# Patient Record
Sex: Male | Born: 1935
Health system: Southern US, Community
[De-identification: ages and names within clinical notes are randomized; demographics above are authoritative.]

## PROBLEM LIST (undated history)

## (undated) DIAGNOSIS — N200 Calculus of kidney: Secondary | ICD-10-CM

## (undated) DIAGNOSIS — Z7902 Long term (current) use of antithrombotics/antiplatelets: Secondary | ICD-10-CM

## (undated) DIAGNOSIS — I2510 Atherosclerotic heart disease of native coronary artery without angina pectoris: Secondary | ICD-10-CM

## (undated) DIAGNOSIS — E785 Hyperlipidemia, unspecified: Secondary | ICD-10-CM

## (undated) DIAGNOSIS — R0609 Other forms of dyspnea: Secondary | ICD-10-CM

## (undated) DIAGNOSIS — R351 Nocturia: Secondary | ICD-10-CM

## (undated) DIAGNOSIS — K5909 Other constipation: Secondary | ICD-10-CM

## (undated) DIAGNOSIS — R300 Dysuria: Secondary | ICD-10-CM

## (undated) DIAGNOSIS — R32 Unspecified urinary incontinence: Secondary | ICD-10-CM

## (undated) DIAGNOSIS — N400 Enlarged prostate without lower urinary tract symptoms: Secondary | ICD-10-CM

## (undated) DIAGNOSIS — M1990 Unspecified osteoarthritis, unspecified site: Secondary | ICD-10-CM

## (undated) DIAGNOSIS — R7303 Prediabetes: Secondary | ICD-10-CM

## (undated) DIAGNOSIS — I739 Peripheral vascular disease, unspecified: Secondary | ICD-10-CM

## (undated) DIAGNOSIS — K8020 Calculus of gallbladder without cholecystitis without obstruction: Secondary | ICD-10-CM

## (undated) DIAGNOSIS — M65331 Trigger finger, right middle finger: Secondary | ICD-10-CM

## (undated) DIAGNOSIS — K219 Gastro-esophageal reflux disease without esophagitis: Secondary | ICD-10-CM

## (undated) DIAGNOSIS — I10 Essential (primary) hypertension: Secondary | ICD-10-CM

## (undated) DIAGNOSIS — I219 Acute myocardial infarction, unspecified: Secondary | ICD-10-CM

## (undated) DIAGNOSIS — I4510 Unspecified right bundle-branch block: Secondary | ICD-10-CM

## (undated) DIAGNOSIS — E7800 Pure hypercholesterolemia, unspecified: Secondary | ICD-10-CM

## (undated) DIAGNOSIS — R350 Frequency of micturition: Secondary | ICD-10-CM

## (undated) DIAGNOSIS — I700 Atherosclerosis of aorta: Secondary | ICD-10-CM

## (undated) DIAGNOSIS — R739 Hyperglycemia, unspecified: Secondary | ICD-10-CM

## (undated) HISTORY — DX: Essential (primary) hypertension: I10

## (undated) HISTORY — DX: Benign prostatic hyperplasia without lower urinary tract symptoms: N40.0

## (undated) HISTORY — DX: Nocturia: R35.1

## (undated) HISTORY — DX: Dysuria: R30.0

## (undated) HISTORY — DX: Unspecified urinary incontinence: R32

## (undated) HISTORY — DX: Frequency of micturition: R35.0

## (undated) HISTORY — PX: VASECTOMY: SHX75

## (undated) HISTORY — PX: CORONARY ARTERY BYPASS GRAFT: SHX141

## (undated) HISTORY — DX: Hyperlipidemia, unspecified: E78.5

---

## 1993-05-22 DIAGNOSIS — Z951 Presence of aortocoronary bypass graft: Secondary | ICD-10-CM

## 1993-05-22 DIAGNOSIS — I219 Acute myocardial infarction, unspecified: Secondary | ICD-10-CM

## 1993-05-22 HISTORY — DX: Acute myocardial infarction, unspecified: I21.9

## 1993-05-22 HISTORY — PX: CORONARY ARTERY BYPASS GRAFT: SHX141

## 1993-05-22 HISTORY — DX: Presence of aortocoronary bypass graft: Z95.1

## 2005-09-27 ENCOUNTER — Ambulatory Visit: Payer: Self-pay | Admitting: Internal Medicine

## 2007-04-17 ENCOUNTER — Ambulatory Visit: Payer: Self-pay | Admitting: Cardiology

## 2007-04-25 ENCOUNTER — Ambulatory Visit: Payer: Self-pay

## 2007-05-09 ENCOUNTER — Ambulatory Visit: Payer: Self-pay | Admitting: Cardiology

## 2010-10-04 NOTE — Assessment & Plan Note (Signed)
[facility]                            CARDIOLOGY OFFICE NOTE   NAME:Testa, RONNE STEFANSKI                           MRN:          [id number]  DATE:05/09/2007                            DOB:          02/09/1933    Mr. Stukey is a 77 year old gentleman who has a history of coronary  disease.  He underwent coronary artery bypassing graft in 1995.  When I  saw him on April 17, 2007, he was complaining of some dyspnea on  exertion.  We performed a Myoview on April 25, 2007.  His ejection  fraction was 61%.  There was a prior inferior scar but there was no  ischemia.  Since then he states his dyspnea has improved somewhat.  He  does have dyspnea with more extreme activities but not with routine  activities around the house.  There is no orthopnea, PND, pedal edema,  palpitations, presyncope, syncope or chest pain.   MEDICATIONS:  1. Atenolol 25 mg p.o. daily.  2. Folate 1 mg p.o. daily.  3. Aspirin 81 mg p.o. daily.  4. Caduet 5/40 daily.  5. Gemfibrozil 600 mg p.o. b.i.d.  6. Captopril 50 mg p.o. daily.  7. Avodart.   PHYSICAL EXAM:  Shows a blood pressure 130/62 and his pulse is 69.  He  weighs 189 pounds.  HEENT:  Normal.  NECK:  Supple.  CHEST:  Clear.  CARDIOVASCULAR EXAM:  Reveals a regular rate and rhythm.  ABDOMINAL EXAM:  No tenderness.  EXTREMITIES:  No edema.   DIAGNOSES:  1. Dyspnea on exertion - His recent Myoview showed no significant      ischemia and preserved left ventricular function.  Will therefore      continue with medical therapy.  He will continue on his aspirin,      statin, beta-blocker and ACE inhibitor.  2. Coronary artery disease - As per number 1.  3. Hyperlipidemia - He will have his most recent lipids and liver      forwarded to [place] for our records.  Our goal LDL should be less than      70, given his history of coronary disease.  4. History of benign prostatic hypertrophy.  5. Hypertension - His blood pressure is  adequately controlled on his      present medications.   He will see me back in 12 months.     Madolyn Frieze Jens Som, MD, [facility]  Electronically Signed    BSC/MedQ  DD: 05/09/2007  DT: 05/10/2007  Job #: [id number]   cc:   Kevan Ny, Dr.

## 2010-10-04 NOTE — Assessment & Plan Note (Signed)
[facility]                            CARDIOLOGY OFFICE NOTE   NAME:Harms, RIPLEY BOGOSIAN                           MRN:          [id number]  DATE:04/17/2007                            DOB:          18-Dec-1932    Mr. Lofton is a 77 year old gentleman whom I have not seen since October  2004. His cardiac history dates back to September of 1995. At that time,  he underwent coronary artery bypassing graft. He had a LIMA to the LAD,  saphenous vein graft to the diagonal, saphenous vein graft to the PDA  arising from the distal circumflex. His most recent nuclear study was  performed in October 2004. At that time, he had an ejection fraction of  61%. There was prior inferior scar versus soft-tissue attenuation, but  there was no ischemia. I have not seen him since that time. Note, the  patient saw Dr. Nedra Hai recently and was complaining of increased dyspnea on  exertion. We were asked to further evaluate. Note, there is a mild  language barrier making the history somewhat difficult. He does state  that he has noticed dyspnea on exertion over the past several weeks.  However, there is no orthopnea, PND, pedal edema, palpitations, pre-  syncope or syncope. He denies any chest pain.   CURRENT MEDICATIONS:  1. Atenolol 25 mg p.o. daily.  2. Folate 1 mg p.o. daily.  3. Aspirin 81 mg p.o. daily.  4. Caduet 5/40 daily.  5. Gemfibrozil 600 mg p.o. b.i.d.  6. Captopril 50 mg p.o. daily.  7. Avodart 0.5 mg p.o. daily.   ALLERGIES:  No known drug allergies.   SOCIAL HISTORY:  He does not smoke nor does he consume alcohol.   FAMILY HISTORY:  Negative for coronary artery disease.   PAST MEDICAL HISTORY:  1. Hypertension.  2. Hyperlipidemia.  3. No diabetes mellitus.  4. He does have a history of coronary disease.  5. He also apparently has a history of benign prostatic hypertrophy.   REVIEW OF SYSTEMS:  Denies any headaches, fevers, chills. There is no  productive cough  or hemoptysis. There is no dysphagia, odynophagia,  melena or hematochezia. There is no dysuria, hematuria. There is no rash  or seizure activity. There is no orthopnea, PND or pedal edema.  Remainder of review of systems are negative.   PHYSICAL EXAMINATION:  Shows a blood pressure of 145/67, pulse 70. He  weighs 186 pounds. He is well-developed, well-nourished in no acute  distress.  SKIN: Warm and dry.  He does not appear to be depressed.  There is no peripheral clubbing.  HEENT: Is normal with normal eyelids.  NECK: Supple, with a normal upstroke bilaterally. Cannot appreciate  bruits. There is no jugular venous distention and no thyromegaly is  noted.  CHEST: Clear to auscultation, normal expansion.  CARDIOVASCULAR: Reveals a regular rate and rhythm, normal S1, S2. There  are no murmurs, rubs or gallops noted.  ABDOMEN: Nontender. Positive bowel sounds. No hepatosplenomegaly. No  masses appreciated. There is no abdominal bruit.  He has 2+ femoral pulses bilaterally.  No bruits.  EXTREMITIES: Show no edema.  I can palpate no cords. He has 2+ posterior  tibial pulses bilaterally.  NEUROLOGIC: Examination is grossly intact.   Electrocardiogram shows a sinus rhythm at a rate of 73. There is an  incomplete right bundle branch block. There is anterior and inferior T-  wave inversion. He has had similar changes on previous  electrocardiograms.   DIAGNOSES:  1. Dyspnea on exertion. The patient is 13 years past coronary artery      bypass graft. We will plan to proceed with a stress Myoview. If      there are any abnormalities, then we will have a low threshold for      cardiac catheterization. We will continue with his aspirin and      statins.  2. Coronary artery disease. He will continue on his aspirin, statin,      beta-blocker and ACE inhibitor.  3. Hyperlipidemia. We will have his most recent lipids and liver      forwarded to [place] for our records with a goal LDL of less than  70.  4. Benign prostatic hypertrophy.   I will see him back in approximately two weeks to review the above  information.     Madolyn Frieze Jens Som, MD, [facility]  Electronically Signed    BSC/MedQ  DD: 04/17/2007  DT: 04/17/2007  Job #: [id number]   cc:   Kevan Ny, MD

## 2011-04-11 ENCOUNTER — Other Ambulatory Visit: Payer: Self-pay | Admitting: Family Medicine

## 2012-06-14 DIAGNOSIS — N3941 Urge incontinence: Secondary | ICD-10-CM | POA: Insufficient documentation

## 2012-06-14 DIAGNOSIS — N401 Enlarged prostate with lower urinary tract symptoms: Secondary | ICD-10-CM | POA: Insufficient documentation

## 2012-06-14 DIAGNOSIS — R351 Nocturia: Secondary | ICD-10-CM | POA: Insufficient documentation

## 2012-06-14 DIAGNOSIS — R350 Frequency of micturition: Secondary | ICD-10-CM | POA: Insufficient documentation

## 2013-02-17 DIAGNOSIS — R339 Retention of urine, unspecified: Secondary | ICD-10-CM | POA: Insufficient documentation

## 2014-02-25 DIAGNOSIS — N400 Enlarged prostate without lower urinary tract symptoms: Secondary | ICD-10-CM | POA: Insufficient documentation

## 2014-02-25 DIAGNOSIS — E785 Hyperlipidemia, unspecified: Secondary | ICD-10-CM | POA: Insufficient documentation

## 2014-02-25 DIAGNOSIS — R739 Hyperglycemia, unspecified: Secondary | ICD-10-CM | POA: Insufficient documentation

## 2014-02-25 DIAGNOSIS — I10 Essential (primary) hypertension: Secondary | ICD-10-CM | POA: Insufficient documentation

## 2014-02-25 DIAGNOSIS — I2510 Atherosclerotic heart disease of native coronary artery without angina pectoris: Secondary | ICD-10-CM | POA: Insufficient documentation

## 2014-04-06 DIAGNOSIS — Z1211 Encounter for screening for malignant neoplasm of colon: Secondary | ICD-10-CM | POA: Insufficient documentation

## 2014-11-26 ENCOUNTER — Encounter: Payer: Self-pay | Admitting: Urology

## 2014-11-26 ENCOUNTER — Ambulatory Visit (INDEPENDENT_AMBULATORY_CARE_PROVIDER_SITE_OTHER): Payer: Self-pay | Admitting: Urology

## 2014-11-26 VITALS — BP 147/70 | HR 78 | Ht 65.75 in | Wt 185.3 lb

## 2014-11-26 DIAGNOSIS — N138 Other obstructive and reflux uropathy: Secondary | ICD-10-CM

## 2014-11-26 DIAGNOSIS — R339 Retention of urine, unspecified: Secondary | ICD-10-CM

## 2014-11-26 DIAGNOSIS — R350 Frequency of micturition: Secondary | ICD-10-CM

## 2014-11-26 DIAGNOSIS — N401 Enlarged prostate with lower urinary tract symptoms: Secondary | ICD-10-CM

## 2014-11-26 LAB — URINALYSIS, COMPLETE
Bilirubin, UA: NEGATIVE
Glucose, UA: NEGATIVE
KETONES UA: NEGATIVE
LEUKOCYTES UA: NEGATIVE
NITRITE UA: NEGATIVE
Protein, UA: NEGATIVE
Specific Gravity, UA: 1.015 (ref 1.005–1.030)
UUROB: 0.2 mg/dL (ref 0.2–1.0)
pH, UA: 7.5 (ref 5.0–7.5)

## 2014-11-26 LAB — MICROSCOPIC EXAMINATION: Bacteria, UA: NONE SEEN

## 2014-11-26 LAB — BLADDER SCAN AMB NON-IMAGING

## 2014-11-26 MED ORDER — SOLIFENACIN SUCCINATE 5 MG PO TABS: 5.0000 mg | ORAL_TABLET | Freq: Every day | ORAL | Status: DC

## 2014-11-26 MED ORDER — FINASTERIDE 5 MG PO TABS: 5.0000 mg | ORAL_TABLET | Freq: Every day | ORAL | Status: DC

## 2014-11-26 NOTE — Progress Notes (Signed)
11/26/2014 10:57 AM   Timothy Bass [birth year] [id number]  Referring provider: No referring provider defined for this encounter.  Chief Complaint  Patient presents with  . Urinary Frequency    HPI: 81 yo M with BPH, LUTS who returns today for routine follow up.  Overall, he reports that his is doing fairly well and has few complaints today.  He is currently of finasteride and vesicare 5 mg daily which are helping his symptoms.   Patient stopped taking Flomax 0.4 mg a few months ago and has not seen much difference in his urinary symptoms.    He denies dysuria or UTIs.    Notcturia x1-2 but toher times more when he has soup a night.         IPSS      11/26/14 0900       International Prostate Symptom Score   How often have you had the sensation of not emptying your bladder? Less than 1 in 5     How often have you had to urinate less than every two hours? Less than half the time     How often have you found you stopped and started again several times when you urinated? Less than 1 in 5 times     How often have you found it difficult to postpone urination? Less than half the time     How often have you had a weak urinary stream? Less than 1 in 5 times     How often have you had to strain to start urination? Less than half the time     How many times did you typically get up at night to urinate? 3 Times     Total IPSS Score 12     Quality of Life due to urinary symptoms   If you were to spend the rest of your life with your urinary condition just the way it is now how would you feel about that? Mostly Satisfied         PMH: Past Medical History  Diagnosis Date  . Hyperlipemia   . BPH without obstruction/lower urinary tract symptoms   . Hypertension   . Dysuria   . Nocturia   . Urinary incontinence   . Urinary frequency     Surgical History: Past Surgical History  Procedure Laterality Date  . Vasectomy    . Coronary artery bypass graft      x2    Home  Medications:    Medication List       This list is accurate as of: 11/26/14 11:59 PM.  Always use your most recent med list.               aspirin 81 MG tablet  Take 81 mg by mouth daily.     atenolol 25 MG tablet  Commonly known as:  TENORMIN  Take 25 mg by mouth daily.     captopril 50 MG tablet  Commonly known as:  CAPOTEN  Take 50 mg by mouth daily.     finasteride 5 MG tablet  Commonly known as:  PROSCAR  Take 1 tablet (5 mg total) by mouth daily.     gemfibrozil 600 MG tablet  Commonly known as:  LOPID  Take 600 mg by mouth daily.     mometasone 0.1 % lotion  Commonly known as:  ELOCON  INSTILL 4 DROPS TO EACH EAR 1 TO 2 TIMES PER WEEK AS NEEDED FOR DRY SKIN AND ITCHING  omeprazole 40 MG capsule  Commonly known as:  PRILOSEC  TAKE 1 CAPSULE (40 MG TOTAL) BY MOUTH ONCE DAILY.     pravastatin 80 MG tablet  Commonly known as:  PRAVACHOL  Take 80 mg by mouth daily.     solifenacin 5 MG tablet  Commonly known as:  VESICARE  Take 1 tablet (5 mg total) by mouth daily.        Allergies:  Allergies  Allergen Reactions  . Aspirin     Pt.states aspirin gives pt. iigh blood pressure  . Peanuts [Peanut Oil]     Family History: Family History  Problem Relation Age of Onset  . Family history unknown: Yes    Social History:  reports that he quit smoking about 21 years ago. His smoking use included Cigarettes. He quit after 20 years of use. He does not have any smokeless tobacco history on file. He reports that he does not drink alcohol. His drug history is not on file.  ROS: UROLOGY Frequent Urination?: Yes Hard to postpone urination?: No Burning/pain with urination?: Yes Get up at night to urinate?: Yes Leakage of urine?: No Urine stream starts and stops?: No Trouble starting stream?: No Do you have to strain to urinate?: Yes Blood in urine?: No Urinary tract infection?: No Sexually transmitted disease?: No Injury to kidneys or bladder?:  No Painful intercourse?: No Weak stream?: No Erection problems?: No Penile pain?: No Gastrointestinal Nausea?: No Vomiting?: No Indigestion/heartburn?: No Diarrhea?: No Constipation?: No Constitutional Fever: No Night sweats?: No Weight loss?: No Fatigue?: No Skin Skin rash/lesions?: No Itching?: No Eyes Blurred vision?: No Double vision?: No Ears/Nose/Throat Sore throat?: No Sinus problems?: No Hematologic/Lymphatic Swollen glands?: No Easy bruising?: No Cardiovascular Leg swelling?: No Chest pain?: No Respiratory Cough?: No Shortness of breath?: No Endocrine Excessive thirst?: No Musculoskeletal Back pain?: Yes Joint pain?: No Neurological Headaches?: No Dizziness?: No Psychologic Depression?: No    ROS  Physical Exam: BP 147/70 mmHg  Pulse 78  Ht 5' 5.75" (1.67 m)  Wt 185 lb 4.8 oz (84.052 kg)  BMI 30.14 kg/m2  Constitutional:  Alert and oriented, No acute distress. HEENT: [place] AT, moist mucus membranes.  Trachea midline, no masses. Cardiovascular: No clubbing, cyanosis, or edema. Respiratory: Normal respiratory effort, no increased work of breathing. GI: Abdomen is soft, nontender, nondistended, no abdominal masses GU: No CVA tenderness.  Skin: No rashes, bruises or suspicious lesions. Neurologic: Grossly intact, no focal deficits, moving all 4 extremities. Psychiatric: Normal mood and affect.  Laboratory Data: N/a   Urinalysis Results for orders placed or performed in visit on 11/26/14  Microscopic Examination  Result Value Ref Range   WBC, UA 0-5 0 -  5 /hpf   RBC, UA 0-2 0 -  2 /hpf   Epithelial Cells (non renal) 0-10 0 - 10 /hpf   Bacteria, UA None seen None seen/Few  Urinalysis, Complete  Result Value Ref Range   Specific Gravity, UA 1.015 1.005 - 1.030   pH, UA 7.5 5.0 - 7.5   Color, UA Yellow Yellow   Appearance Ur Clear Clear   Leukocytes, UA Negative Negative   Protein, UA Negative Negative/Trace   Glucose, UA Negative  Negative   Ketones, UA Negative Negative   RBC, UA Trace (A) Negative   Bilirubin, UA Negative Negative   Urobilinogen, Ur 0.2 0.2 - 1.0 mg/dL   Nitrite, UA Negative Negative   Microscopic Examination See below:   BLADDER SCAN AMB NON-IMAGING  Result Value Ref Range  Scan Result 68ml     Assessment & Plan:  81 yo M with history of BPH, urinary frequency, and h/o incomplete bladder emptying.  Symptoms stable, doing well today.   No indication for further PCa screening given age.   1. Urinary frequency Continue Vesicare 5 mg - Urinalysis, Complete - BLADDER SCAN AMB NON-IMAGING  2. Benign prostatic hyperplasia with urinary obstruction Continue finasteride  3. Incomplete bladder emptying History of previously elevated PVRs.  PVR today acceptable on Vesicare 5mg     Return in about 1 year (around 11/26/2015) for IPSS, PVR.  Hollice Espy, MD  [facility] [address], [place], [place] [address] [phone]

## 2015-05-23 HISTORY — PX: OTHER SURGICAL HISTORY: SHX169

## 2015-05-27 ENCOUNTER — Ambulatory Visit: Payer: Medicare Other | Admitting: Anesthesiology

## 2015-05-27 ENCOUNTER — Encounter: Payer: Self-pay | Admitting: *Deleted

## 2015-05-27 ENCOUNTER — Ambulatory Visit
Admission: RE | Admit: 2015-05-27 | Discharge: 2015-05-27 | Disposition: A | Discharge status: Home / Self Care | Payer: Medicare Other | Source: Ambulatory Visit | Attending: Gastroenterology | Admitting: Gastroenterology

## 2015-05-27 ENCOUNTER — Encounter
Admission: RE | Disposition: A | Discharge status: Home / Self Care | Payer: Self-pay | Source: Ambulatory Visit | Attending: Gastroenterology

## 2015-05-27 DIAGNOSIS — R1013 Epigastric pain: Secondary | ICD-10-CM | POA: Insufficient documentation

## 2015-05-27 DIAGNOSIS — K210 Gastro-esophageal reflux disease with esophagitis: Secondary | ICD-10-CM | POA: Diagnosis not present

## 2015-05-27 DIAGNOSIS — R1310 Dysphagia, unspecified: Secondary | ICD-10-CM | POA: Insufficient documentation

## 2015-05-27 DIAGNOSIS — R350 Frequency of micturition: Secondary | ICD-10-CM | POA: Insufficient documentation

## 2015-05-27 DIAGNOSIS — R351 Nocturia: Secondary | ICD-10-CM | POA: Diagnosis not present

## 2015-05-27 DIAGNOSIS — N401 Enlarged prostate with lower urinary tract symptoms: Secondary | ICD-10-CM | POA: Diagnosis not present

## 2015-05-27 DIAGNOSIS — R739 Hyperglycemia, unspecified: Secondary | ICD-10-CM | POA: Insufficient documentation

## 2015-05-27 DIAGNOSIS — Z7982 Long term (current) use of aspirin: Secondary | ICD-10-CM | POA: Insufficient documentation

## 2015-05-27 DIAGNOSIS — Z79899 Other long term (current) drug therapy: Secondary | ICD-10-CM | POA: Insufficient documentation

## 2015-05-27 DIAGNOSIS — I2510 Atherosclerotic heart disease of native coronary artery without angina pectoris: Secondary | ICD-10-CM | POA: Insufficient documentation

## 2015-05-27 DIAGNOSIS — I10 Essential (primary) hypertension: Secondary | ICD-10-CM | POA: Insufficient documentation

## 2015-05-27 DIAGNOSIS — R338 Other retention of urine: Secondary | ICD-10-CM | POA: Diagnosis not present

## 2015-05-27 DIAGNOSIS — E785 Hyperlipidemia, unspecified: Secondary | ICD-10-CM | POA: Insufficient documentation

## 2015-05-27 DIAGNOSIS — K219 Gastro-esophageal reflux disease without esophagitis: Secondary | ICD-10-CM | POA: Diagnosis present

## 2015-05-27 HISTORY — PX: ESOPHAGOGASTRODUODENOSCOPY (EGD) WITH PROPOFOL: SHX5813

## 2015-05-27 HISTORY — DX: Hyperglycemia, unspecified: R73.9

## 2015-05-27 HISTORY — DX: Atherosclerotic heart disease of native coronary artery without angina pectoris: I25.10

## 2015-05-27 HISTORY — DX: Benign prostatic hyperplasia without lower urinary tract symptoms: N40.0

## 2015-05-27 SURGERY — ESOPHAGOGASTRODUODENOSCOPY (EGD) WITH PROPOFOL
Anesthesia: General

## 2015-05-27 MED ORDER — MIDAZOLAM HCL 2 MG/2ML IJ SOLN
INTRAMUSCULAR | Status: DC | PRN
Start: 2015-05-27 — End: 2015-05-27
  Administered 2015-05-27: 1 mg via INTRAVENOUS

## 2015-05-27 MED ORDER — FENTANYL CITRATE (PF) 100 MCG/2ML IJ SOLN
INTRAMUSCULAR | Status: DC | PRN
  Administered 2015-05-27: 50 ug via INTRAVENOUS

## 2015-05-27 MED ORDER — SODIUM CHLORIDE 0.9 % IV SOLN: INTRAVENOUS | Status: DC

## 2015-05-27 MED ORDER — PROPOFOL 10 MG/ML IV BOLUS
INTRAVENOUS | Status: DC | PRN
  Administered 2015-05-27: 40 mg via INTRAVENOUS

## 2015-05-27 MED ORDER — ATENOLOL 25 MG PO TABS
25.0000 mg | ORAL_TABLET | Freq: Once | ORAL | Status: AC
  Administered 2015-05-27: 25 mg via ORAL

## 2015-05-27 MED ORDER — SODIUM CHLORIDE 0.9 % IV SOLN
INTRAVENOUS | Status: DC
  Administered 2015-05-27 (×2): via INTRAVENOUS

## 2015-05-27 MED ORDER — PROPOFOL 500 MG/50ML IV EMUL
INTRAVENOUS | Status: DC | PRN
  Administered 2015-05-27: 40 ug/kg/min via INTRAVENOUS

## 2015-05-27 NOTE — Anesthesia Postprocedure Evaluation (Signed)
Anesthesia Post Note  Patient: Timothy Bass  Procedure(s) Performed: Procedure(s) (LRB): ESOPHAGOGASTRODUODENOSCOPY (EGD) WITH PROPOFOL (N/A)  Patient location during evaluation: Endoscopy Anesthesia Type: General Level of consciousness: awake and alert Pain management: pain level controlled Vital Signs Assessment: post-procedure vital signs reviewed and stable Respiratory status: spontaneous breathing, nonlabored ventilation, respiratory function stable and patient connected to nasal cannula oxygen Cardiovascular status: blood pressure returned to baseline and stable Postop Assessment: no signs of nausea or vomiting Anesthetic complications: no    Last Vitals:  Filed Vitals:   05/27/15 1030 05/27/15 1040  BP: 134/66 147/75  Pulse:    Temp:    Resp:      Last Pain:  Filed Vitals:   05/27/15 1059  PainSc: 0-No pain                 Martha Clan

## 2015-05-27 NOTE — Op Note (Signed)
[facility] Patient Name: Timothy Bass Procedure Date: 05/27/2015 9:49 AM MRN: [id number] Account #: [id number] Date of Birth: 10/14/1932 Admit Type: Outpatient Age: 82 Room: [facility] ENDO ROOM 4 Gender: Male Note Status: Finalized Procedure:         Upper GI endoscopy Indications:       Epigastric abdominal pain, Dysphagia, Suspected esophageal                     reflux Providers:         Lupita Dawn. Candace Cruise, MD Referring MD:      Youlanda Roys. Lovie Macadamia, MD (Referring MD) Medicines:         Monitored Anesthesia Care Complications:     No immediate complications. Procedure:         Pre-Anesthesia Assessment:                    - Prior to the procedure, a History and Physical was                     performed, and patient medications, allergies and                     sensitivities were reviewed. The patient's tolerance of                     previous anesthesia was reviewed.                    - The risks and benefits of the procedure and the sedation                     options and risks were discussed with the patient. All                     questions were answered and informed consent was obtained.                    - After reviewing the risks and benefits, the patient was                     deemed in satisfactory condition to undergo the procedure.                    After obtaining informed consent, the endoscope was passed                     under direct vision. Throughout the procedure, the                     patient's blood pressure, pulse, and oxygen saturations                     were monitored continuously. The Endoscope was introduced                     through the mouth, and advanced to the second part of                     duodenum. The upper GI endoscopy was accomplished without                     difficulty. The patient tolerated the procedure well. Findings:      LA Grade A (one or more mucosal breaks less than  5 mm, not extending        between tops of 2 mucosal folds) esophagitis was found at the       gastroesophageal junction. Biopsies were taken with a cold forceps for       histology. The scope was withdrawn. Dilation was performed with a       Maloney dilator with mild resistance at 39 Fr.      The exam was otherwise without abnormality.      The entire examined stomach was normal.      Localized mildly erythematous mucosa was found in the duodenal bulb.       Biopsies were taken with a cold forceps for histology.      The exam was otherwise without abnormality. Impression:        - LA Grade A reflux esophagitis. Biopsied. Dilated.                    - The examination was otherwise normal.                    - Normal stomach.                    - Erythematous duodenopathy. Biopsied.                    - The examination was otherwise normal. Recommendation:    - Discharge patient to home.                    - Observe patient's clinical course.                    - Await pathology results.                    - Continue present medications.                    - The findings and recommendations were discussed with the                     patient. Procedure Code(s): --- Professional ---                    [phone], Esophagogastroduodenoscopy, flexible, transoral;                     with biopsy, single or multiple                    43450, Dilation of esophagus, by unguided sound or bougie,                     single or multiple passes Diagnosis Code(s): --- Professional ---                    K21.0, Gastro-esophageal reflux disease with esophagitis                    K31.89, Other diseases of stomach and duodenum                    R13.10, Dysphagia, unspecified                    R10.13, Epigastric pain CPT copyright 2014 American Medical Association. All rights reserved. The codes documented in this report are preliminary and upon coder review may  be revised  to meet current compliance requirements. Hulen Luster,  MD 05/27/2015 10:05:42 AM This report has been signed electronically. Number of Addenda: 0 Note Initiated On: 05/27/2015 9:49 AM      [facility]

## 2015-05-27 NOTE — Transfer of Care (Signed)
Immediate Anesthesia Transfer of Care Note  Patient: Timothy Bass  Procedure(s) Performed: Procedure(s): ESOPHAGOGASTRODUODENOSCOPY (EGD) WITH PROPOFOL (N/A)  Patient Location: PACU  Anesthesia Type:General  Level of Consciousness: awake, alert  and oriented  Airway & Oxygen Therapy: Patient Spontanous Breathing and Patient connected to nasal cannula oxygen  Post-op Assessment: Report given to RN and Post -op Vital signs reviewed and stable  Post vital signs: Reviewed and stable  Last Vitals:  Filed Vitals:   05/27/15 0839  BP: 166/79  Pulse: 75  Temp: 36.5 C  Resp: 16    Complications: No apparent anesthesia complications

## 2015-05-27 NOTE — Anesthesia Preprocedure Evaluation (Signed)
Anesthesia Evaluation  Patient identified by MRN, date of birth, ID band Patient awake    Reviewed: Allergy & Precautions, H&P , NPO status , Patient's Chart, lab work & pertinent test results, reviewed documented beta blocker date and time   History of Anesthesia Complications Negative for: history of anesthetic complications  Airway Mallampati: II  TM Distance: >3 FB Neck ROM: full    Dental no notable dental hx. (+) Edentulous Upper, Edentulous Lower   Pulmonary neg shortness of breath, neg sleep apnea, neg COPD, neg recent URI, former smoker,    Pulmonary exam normal breath sounds clear to auscultation       Cardiovascular Exercise Tolerance: Good hypertension, (-) angina+ CAD, + Past MI and + CABG (in 1994)  (-) Cardiac Stents negative cardio ROS Normal cardiovascular exam(-) dysrhythmias (-) Valvular Problems/Murmurs Rhythm:regular Rate:Normal     Neuro/Psych negative neurological ROS  negative psych ROS   GI/Hepatic Neg liver ROS, GERD  Medicated and Controlled,  Endo/Other  negative endocrine ROS  Renal/GU negative Renal ROS  negative genitourinary   Musculoskeletal   Abdominal   Peds  Hematology negative hematology ROS (+)   Anesthesia Other Findings Past Medical History:   Hyperlipemia                                                 BPH without obstruction/lower urinary tract sy*              Hypertension                                                 Dysuria                                                      Nocturia                                                     Urinary incontinence                                         Urinary frequency                                            Coronary artery disease                                      Hyperglycemia  Enlarged prostate                                            Prostatic hypertrophy                                         Reproductive/Obstetrics negative OB ROS                             Anesthesia Physical Anesthesia Plan  ASA: III  Anesthesia Plan: General   Post-op Pain Management:    Induction:   Airway Management Planned:   Additional Equipment:   Intra-op Plan:   Post-operative Plan:   Informed Consent: I have reviewed the patients History and Physical, chart, labs and discussed the procedure including the risks, benefits and alternatives for the proposed anesthesia with the patient or authorized representative who has indicated his/her understanding and acceptance.   Dental Advisory Given  Plan Discussed with: Anesthesiologist, CRNA and Surgeon  Anesthesia Plan Comments:         Anesthesia Quick Evaluation

## 2015-05-27 NOTE — Anesthesia Procedure Notes (Signed)
Date/Time: 05/27/2015 9:44 AM Performed by: Nelda Marseille Pre-anesthesia Checklist: Patient identified, Emergency Drugs available, Suction available, Patient being monitored and Timeout performed Oxygen Delivery Method: Nasal cannula

## 2015-05-27 NOTE — H&P (Signed)
  Date of Initial H&P:05/05/2015  History reviewed, patient examined, no change in status, stable for surgery.

## 2015-05-31 LAB — SURGICAL PATHOLOGY

## 2015-06-17 ENCOUNTER — Telehealth: Payer: Self-pay

## 2015-06-17 NOTE — Telephone Encounter (Signed)
Pt pharmacy sent a refill request for tamsulosin. I did not see in your dictation where he was taking medication and in d/c'd meds I see where it was taken out. Please advise.

## 2015-06-17 NOTE — Telephone Encounter (Signed)
Does not need.  He was not taking and saw no difference in voiding symptoms.    Hollice Espy, MD

## 2015-07-20 ENCOUNTER — Encounter: Payer: Self-pay | Admitting: Obstetrics and Gynecology

## 2015-07-20 ENCOUNTER — Ambulatory Visit (INDEPENDENT_AMBULATORY_CARE_PROVIDER_SITE_OTHER): Payer: Medicare Other | Admitting: Obstetrics and Gynecology

## 2015-07-20 VITALS — BP 136/70 | HR 73 | Resp 16 | Ht 65.75 in | Wt 184.8 lb

## 2015-07-20 DIAGNOSIS — N400 Enlarged prostate without lower urinary tract symptoms: Secondary | ICD-10-CM

## 2015-07-20 DIAGNOSIS — R350 Frequency of micturition: Secondary | ICD-10-CM | POA: Diagnosis not present

## 2015-07-20 LAB — BLADDER SCAN AMB NON-IMAGING

## 2015-07-20 MED ORDER — TAMSULOSIN HCL 0.4 MG PO CAPS: 0.4000 mg | ORAL_CAPSULE | Freq: Every day | ORAL | Status: DC

## 2015-07-20 NOTE — Progress Notes (Signed)
10:49 AM   Timothy Bass 1932/06/19 [id number]  Referring provider: Juluis Pitch, MD [address] [place], [place] [address]  Chief Complaint  Patient presents with  . Urinary Frequency    HPI: 82 yo M with BPH, LUTS who returns today for routine follow up.  Overall, he reports that his is doing fairly well and has few complaints today.  He is currently of finasteride and vesicare 5 mg daily which are helping his symptoms.   Patient stopped taking Flomax 0.4 mg a few months ago and has not seen much difference in his urinary symptoms.    He denies dysuria or UTIs.    Nocturia x1-2 but toher times more when he has soup a night.    Interval History 07/20/15  Patient requesting refill for Flomax.  Feels his symptoms improved while taking medication.   PMH: Past Medical History  Diagnosis Date  . Hyperlipemia   . BPH without obstruction/lower urinary tract symptoms   . Hypertension   . Dysuria   . Nocturia   . Urinary incontinence   . Urinary frequency   . Coronary artery disease   . Hyperglycemia   . Enlarged prostate   . Prostatic hypertrophy     Surgical History: Past Surgical History  Procedure Laterality Date  . Vasectomy    . Coronary artery bypass graft      x2  . Esophagogastroduodenoscopy (egd) with propofol N/A 05/27/2015    Procedure: ESOPHAGOGASTRODUODENOSCOPY (EGD) WITH PROPOFOL;  Surgeon: Hulen Luster, MD;  Location: [facility] ENDOSCOPY;  Service: Gastroenterology;  Laterality: N/A;    Home Medications:    Medication List       This list is accurate as of: 07/20/15 10:49 AM.  Always use your most recent med list.               amLODipine-benazepril 5-10 MG capsule  Commonly known as:  LOTREL     aspirin 81 MG tablet  Take 81 mg by mouth daily.     atenolol 25 MG tablet  Commonly known as:  TENORMIN  Take 25 mg by mouth daily.     bismuth subsalicylate 99991111 MG chewable tablet  Commonly known as:  PEPTO BISMOL  Chew 524 mg by mouth as needed.       captopril 50 MG tablet  Commonly known as:  CAPOTEN  Take 50 mg by mouth daily.     Cholecalciferol 1000 units Tbdp  Take 1,000 Units by mouth daily.     finasteride 5 MG tablet  Commonly known as:  PROSCAR  Take 1 tablet (5 mg total) by mouth daily.     gemfibrozil 600 MG tablet  Commonly known as:  LOPID  Take 600 mg by mouth daily.     glucosamine-chondroitin 500-400 MG tablet  Take 1 tablet by mouth 3 (three) times daily.     mometasone 0.1 % lotion  Commonly known as:  ELOCON  INSTILL 4 DROPS TO EACH EAR 1 TO 2 TIMES PER WEEK AS NEEDED FOR DRY SKIN AND ITCHING     MULTI-VITAMINS Tabs  Take by mouth.     omega-3 acid ethyl esters 1 g capsule  Commonly known as:  LOVAZA  Take by mouth 2 (two) times daily.     omeprazole 40 MG capsule  Commonly known as:  PRILOSEC  TAKE 1 CAPSULE (40 MG TOTAL) BY MOUTH ONCE DAILY.     pantoprazole 40 MG tablet  Commonly known as:  PROTONIX  Take 40 mg by mouth daily.     pravastatin 80 MG tablet  Commonly known as:  PRAVACHOL  Take 80 mg by mouth daily.     solifenacin 5 MG tablet  Commonly known as:  VESICARE  Take 1 tablet (5 mg total) by mouth daily.     tamsulosin 0.4 MG Caps capsule  Commonly known as:  FLOMAX  Take 0.4 mg by mouth.        Allergies:  Allergies  Allergen Reactions  . Aspirin     Pt.states aspirin gives pt. iigh blood pressure  . Peanuts [Peanut Oil]     Family History: Family History  Problem Relation Age of Onset  . Family history unknown: Yes    Social History:  reports that he quit smoking about 22 years ago. His smoking use included Cigarettes. He quit after 20 years of use. He does not have any smokeless tobacco history on file. He reports that he does not drink alcohol. His drug history is not on file.  ROS: UROLOGY Frequent Urination?: No Hard to postpone urination?: No Burning/pain with urination?: No Get up at night to urinate?: Yes Leakage of urine?: Yes Urine stream  starts and stops?: No Trouble starting stream?: Yes Do you have to strain to urinate?: No Blood in urine?: No Urinary tract infection?: No Sexually transmitted disease?: No Injury to kidneys or bladder?: No Painful intercourse?: No Weak stream?: No Erection problems?: No Penile pain?: No Gastrointestinal Nausea?: No Vomiting?: No Indigestion/heartburn?: No Diarrhea?: No Constipation?: No Constitutional Fever: No Night sweats?: No Weight loss?: No Fatigue?: No Skin Skin rash/lesions?: No Itching?: No Eyes Blurred vision?: No Double vision?: No Ears/Nose/Throat Sore throat?: No Sinus problems?: No Hematologic/Lymphatic Swollen glands?: No Easy bruising?: No Cardiovascular Leg swelling?: No Chest pain?: No Respiratory Cough?: No Shortness of breath?: Yes Endocrine Excessive thirst?: No Musculoskeletal Back pain?: Yes Joint pain?: No Neurological Headaches?: No Dizziness?: No Psychologic Depression?: No Anxiety?: No    ROS  Physical Exam: BP 136/70 mmHg  Pulse 73  Resp 16  Ht 5' 5.75" (1.67 m)  Wt 184 lb 12.8 oz (83.825 kg)  BMI 30.06 kg/m2  Constitutional:  Alert and oriented, No acute distress. HEENT: [place] AT, moist mucus membranes.  Trachea midline, no masses. Cardiovascular: No clubbing, cyanosis, or edema. Respiratory: Normal respiratory effort, no increased work of breathing. GI: Abdomen is soft, nontender, nondistended, no abdominal masses GU: No CVA tenderness.  Skin: No rashes, bruises or suspicious lesions. Neurologic: Grossly intact, no focal deficits, moving all 4 extremities. Psychiatric: Normal mood and affect.  Laboratory Data: N/a   Urinalysis Results for orders placed or performed in visit on 07/20/15  BLADDER SCAN AMB NON-IMAGING  Result Value Ref Range   Scan Result 169 mL     Assessment & Plan:  82 yo M with history of BPH, urinary frequency, and h/o incomplete bladder emptying.  Symptoms stable, doing well today.   No  indication for further PCa screening given age.   1. Urinary frequency Continue Vesicare 5 mg - Urinalysis, Complete - BLADDER SCAN AMB NON-IMAGING  2. Benign prostatic hyperplasia with urinary obstruction- Refilled Flomax. Continue finasteride  3. Incomplete bladder emptying History of previously elevated PVRs.  PVR today acceptable on Vesicare 5mg     No Follow-up on file.  Herbert Moors, [facility] [address] [place], [place] [address] [phone]

## 2015-11-26 ENCOUNTER — Ambulatory Visit: Payer: Self-pay | Admitting: Urology

## 2015-11-30 ENCOUNTER — Ambulatory Visit: Payer: Medicare Other | Admitting: Urology

## 2015-12-13 ENCOUNTER — Other Ambulatory Visit: Payer: Self-pay | Admitting: Urology

## 2015-12-28 ENCOUNTER — Other Ambulatory Visit: Payer: Self-pay | Admitting: Urology

## 2015-12-28 DIAGNOSIS — N400 Enlarged prostate without lower urinary tract symptoms: Secondary | ICD-10-CM

## 2015-12-30 DIAGNOSIS — I200 Unstable angina: Secondary | ICD-10-CM

## 2016-01-05 ENCOUNTER — Encounter: Payer: Self-pay | Admitting: *Deleted

## 2016-01-05 ENCOUNTER — Encounter
Admission: RE | Disposition: A | Discharge status: Home / Self Care | Payer: Self-pay | Source: Ambulatory Visit | Attending: Cardiology

## 2016-01-05 ENCOUNTER — Ambulatory Visit
Admission: RE | Admit: 2016-01-05 | Discharge: 2016-01-06 | Disposition: A | Discharge status: Home / Self Care | Payer: Medicare Other | Source: Ambulatory Visit | Attending: Cardiology | Admitting: Cardiology

## 2016-01-05 DIAGNOSIS — E7801 Familial hypercholesterolemia: Secondary | ICD-10-CM | POA: Insufficient documentation

## 2016-01-05 DIAGNOSIS — X58XXXA Exposure to other specified factors, initial encounter: Secondary | ICD-10-CM | POA: Insufficient documentation

## 2016-01-05 DIAGNOSIS — I10 Essential (primary) hypertension: Secondary | ICD-10-CM | POA: Diagnosis not present

## 2016-01-05 DIAGNOSIS — K210 Gastro-esophageal reflux disease with esophagitis: Secondary | ICD-10-CM | POA: Insufficient documentation

## 2016-01-05 DIAGNOSIS — Z79899 Other long term (current) drug therapy: Secondary | ICD-10-CM | POA: Diagnosis not present

## 2016-01-05 DIAGNOSIS — Z87891 Personal history of nicotine dependence: Secondary | ICD-10-CM | POA: Insufficient documentation

## 2016-01-05 DIAGNOSIS — Z9852 Vasectomy status: Secondary | ICD-10-CM | POA: Diagnosis not present

## 2016-01-05 DIAGNOSIS — T82858A Stenosis of vascular prosthetic devices, implants and grafts, initial encounter: Secondary | ICD-10-CM | POA: Diagnosis not present

## 2016-01-05 DIAGNOSIS — Z91010 Allergy to peanuts: Secondary | ICD-10-CM | POA: Diagnosis not present

## 2016-01-05 DIAGNOSIS — N400 Enlarged prostate without lower urinary tract symptoms: Secondary | ICD-10-CM | POA: Insufficient documentation

## 2016-01-05 DIAGNOSIS — Z7982 Long term (current) use of aspirin: Secondary | ICD-10-CM | POA: Insufficient documentation

## 2016-01-05 DIAGNOSIS — R079 Chest pain, unspecified: Secondary | ICD-10-CM | POA: Diagnosis present

## 2016-01-05 DIAGNOSIS — I25709 Atherosclerosis of coronary artery bypass graft(s), unspecified, with unspecified angina pectoris: Secondary | ICD-10-CM | POA: Diagnosis not present

## 2016-01-05 DIAGNOSIS — I2510 Atherosclerotic heart disease of native coronary artery without angina pectoris: Secondary | ICD-10-CM | POA: Diagnosis present

## 2016-01-05 DIAGNOSIS — I200 Unstable angina: Secondary | ICD-10-CM

## 2016-01-05 HISTORY — DX: Gastro-esophageal reflux disease without esophagitis: K21.9

## 2016-01-05 HISTORY — DX: Pure hypercholesterolemia, unspecified: E78.00

## 2016-01-05 HISTORY — PX: CARDIAC CATHETERIZATION: SHX172

## 2016-01-05 SURGERY — LEFT HEART CATH AND CORS/GRAFTS ANGIOGRAPHY
Anesthesia: Moderate Sedation

## 2016-01-05 MED ORDER — NITROGLYCERIN 1 MG/10 ML FOR IR/CATH LAB
INTRA_ARTERIAL | Status: DC | PRN
  Administered 2016-01-05: 300 ug via INTRACORONARY

## 2016-01-05 MED ORDER — SODIUM CHLORIDE 0.9 % IV SOLN: INTRAVENOUS | Status: DC | PRN

## 2016-01-05 MED ORDER — ONDANSETRON HCL 4 MG/2ML IJ SOLN: 4.0000 mg | Freq: Four times a day (QID) | INTRAMUSCULAR | Status: DC | PRN

## 2016-01-05 MED ORDER — IOPAMIDOL (ISOVUE-300) INJECTION 61%
INTRAVENOUS | Status: DC | PRN
  Administered 2016-01-05: 255 mL via INTRA_ARTERIAL

## 2016-01-05 MED ORDER — ACETAMINOPHEN 325 MG PO TABS: 650.0000 mg | ORAL_TABLET | ORAL | Status: DC | PRN

## 2016-01-05 MED ORDER — SODIUM CHLORIDE 0.9% FLUSH
3.0000 mL | Freq: Two times a day (BID) | INTRAVENOUS | Status: DC
  Administered 2016-01-05 (×2): 3 mL via INTRAVENOUS

## 2016-01-05 MED ORDER — SODIUM CHLORIDE 0.9 % WEIGHT BASED INFUSION: 3.0000 mL/kg/h | INTRAVENOUS | Status: DC

## 2016-01-05 MED ORDER — NITROGLYCERIN 5 MG/ML IV SOLN
INTRAVENOUS | Status: AC
  Filled 2016-01-05: qty 10

## 2016-01-05 MED ORDER — FENTANYL CITRATE (PF) 100 MCG/2ML IJ SOLN
INTRAMUSCULAR | Status: AC
  Filled 2016-01-05: qty 2

## 2016-01-05 MED ORDER — SODIUM CHLORIDE 0.9 % IV SOLN: 250.0000 mL | INTRAVENOUS | Status: DC | PRN

## 2016-01-05 MED ORDER — AMLODIPINE BESYLATE 5 MG PO TABS
5.0000 mg | ORAL_TABLET | Freq: Every day | ORAL | Status: DC
Start: 2016-01-05 — End: 2016-01-06
  Administered 2016-01-05 – 2016-01-06 (×2): 5 mg via ORAL
  Filled 2016-01-05 (×2): qty 1

## 2016-01-05 MED ORDER — SODIUM CHLORIDE 0.9 % WEIGHT BASED INFUSION: 1.0000 mL/kg/h | INTRAVENOUS | Status: DC

## 2016-01-05 MED ORDER — BIVALIRUDIN 250 MG IV SOLR
INTRAVENOUS | Status: DC | PRN
  Administered 2016-01-05: 1.75 mg/kg/h via INTRAVENOUS

## 2016-01-05 MED ORDER — ASPIRIN EC 325 MG PO TBEC
325.0000 mg | DELAYED_RELEASE_TABLET | Freq: Every day | ORAL | Status: DC
  Administered 2016-01-06: 325 mg via ORAL
  Filled 2016-01-05 (×2): qty 1

## 2016-01-05 MED ORDER — BIVALIRUDIN 250 MG IV SOLR
INTRAVENOUS | Status: AC
  Filled 2016-01-05: qty 250

## 2016-01-05 MED ORDER — BENAZEPRIL HCL 10 MG PO TABS
10.0000 mg | ORAL_TABLET | Freq: Every day | ORAL | Status: DC
  Administered 2016-01-05 – 2016-01-06 (×2): 10 mg via ORAL
  Filled 2016-01-05 (×2): qty 1

## 2016-01-05 MED ORDER — CLOPIDOGREL BISULFATE 75 MG PO TABS
75.0000 mg | ORAL_TABLET | Freq: Every day | ORAL | Status: DC
  Administered 2016-01-06: 75 mg via ORAL
  Filled 2016-01-05: qty 1

## 2016-01-05 MED ORDER — ASPIRIN 81 MG PO CHEW: 81.0000 mg | CHEWABLE_TABLET | ORAL | Status: DC

## 2016-01-05 MED ORDER — TAMSULOSIN HCL 0.4 MG PO CAPS
0.4000 mg | ORAL_CAPSULE | Freq: Every day | ORAL | Status: DC
  Administered 2016-01-05 – 2016-01-06 (×2): 0.4 mg via ORAL
  Filled 2016-01-05 (×2): qty 1

## 2016-01-05 MED ORDER — SODIUM CHLORIDE 0.9% FLUSH: 3.0000 mL | INTRAVENOUS | Status: DC | PRN

## 2016-01-05 MED ORDER — IOPAMIDOL (ISOVUE-300) INJECTION 61%
INTRAVENOUS | Status: DC | PRN
  Administered 2016-01-05: 210 mL via INTRA_ARTERIAL

## 2016-01-05 MED ORDER — TERAZOSIN HCL 2 MG PO CAPS
2.0000 mg | ORAL_CAPSULE | Freq: Every day | ORAL | Status: DC
  Administered 2016-01-05: 2 mg via ORAL
  Filled 2016-01-05: qty 1

## 2016-01-05 MED ORDER — ASPIRIN 81 MG PO CHEW
CHEWABLE_TABLET | ORAL | Status: AC
  Filled 2016-01-05: qty 4

## 2016-01-05 MED ORDER — CLOPIDOGREL BISULFATE 75 MG PO TABS
ORAL_TABLET | ORAL | Status: DC | PRN
  Administered 2016-01-05: 600 mg via ORAL

## 2016-01-05 MED ORDER — SODIUM CHLORIDE 0.9% FLUSH: 3.0000 mL | Freq: Two times a day (BID) | INTRAVENOUS | Status: DC

## 2016-01-05 MED ORDER — ATENOLOL 25 MG PO TABS
25.0000 mg | ORAL_TABLET | Freq: Every day | ORAL | Status: DC
  Administered 2016-01-05 – 2016-01-06 (×2): 25 mg via ORAL
  Filled 2016-01-05 (×2): qty 1

## 2016-01-05 MED ORDER — ASPIRIN 81 MG PO CHEW
CHEWABLE_TABLET | ORAL | Status: DC | PRN
  Administered 2016-01-05: 324 mg via ORAL

## 2016-01-05 MED ORDER — ISOSORBIDE MONONITRATE ER 30 MG PO TB24
30.0000 mg | ORAL_TABLET | Freq: Every day | ORAL | Status: DC
  Administered 2016-01-05 – 2016-01-06 (×2): 30 mg via ORAL
  Filled 2016-01-05 (×2): qty 1

## 2016-01-05 MED ORDER — BIVALIRUDIN BOLUS VIA INFUSION - CUPID
INTRAVENOUS | Status: DC | PRN
  Administered 2016-01-05: 61.2 mg via INTRAVENOUS

## 2016-01-05 MED ORDER — FENTANYL CITRATE (PF) 100 MCG/2ML IJ SOLN
INTRAMUSCULAR | Status: DC | PRN
  Administered 2016-01-05: 50 ug via INTRAVENOUS
  Administered 2016-01-05: 25 ug via INTRAVENOUS

## 2016-01-05 MED ORDER — PRAVASTATIN SODIUM 40 MG PO TABS
80.0000 mg | ORAL_TABLET | Freq: Every day | ORAL | Status: DC
  Administered 2016-01-05: 80 mg via ORAL
  Filled 2016-01-05: qty 2

## 2016-01-05 MED ORDER — CLOPIDOGREL BISULFATE 75 MG PO TABS
ORAL_TABLET | ORAL | Status: AC
  Filled 2016-01-05: qty 8

## 2016-01-05 MED ORDER — MIDAZOLAM HCL 2 MG/2ML IJ SOLN
INTRAMUSCULAR | Status: AC
  Filled 2016-01-05: qty 2

## 2016-01-05 MED ORDER — SODIUM CHLORIDE 0.9 % WEIGHT BASED INFUSION: 3.0000 mL/kg/h | INTRAVENOUS | Status: AC

## 2016-01-05 MED ORDER — FENTANYL CITRATE (PF) 100 MCG/2ML IJ SOLN
INTRAMUSCULAR | Status: DC | PRN
  Administered 2016-01-05: 25 ug via INTRAVENOUS

## 2016-01-05 MED ORDER — MIDAZOLAM HCL 2 MG/2ML IJ SOLN
INTRAMUSCULAR | Status: DC | PRN
  Administered 2016-01-05: 1 mg via INTRAVENOUS
  Administered 2016-01-05: 0.5 mg via INTRAVENOUS

## 2016-01-05 MED ORDER — MIDAZOLAM HCL 2 MG/2ML IJ SOLN
INTRAMUSCULAR | Status: DC | PRN
  Administered 2016-01-05: 0.5 mg via INTRAVENOUS

## 2016-01-05 MED ORDER — SODIUM CHLORIDE 0.9% FLUSH
3.0000 mL | INTRAVENOUS | Status: DC | PRN
  Administered 2016-01-05: 3 mL via INTRAVENOUS

## 2016-01-05 MED ORDER — GEMFIBROZIL 600 MG PO TABS
600.0000 mg | ORAL_TABLET | Freq: Two times a day (BID) | ORAL | Status: DC
  Administered 2016-01-05 – 2016-01-06 (×2): 600 mg via ORAL
  Filled 2016-01-05 (×2): qty 1

## 2016-01-05 MED ORDER — SODIUM CHLORIDE 0.9 % IV SOLN
INTRAVENOUS | Status: DC
  Administered 2016-01-05 – 2016-01-06 (×2): via INTRAVENOUS

## 2016-01-05 MED ORDER — HEPARIN (PORCINE) IN NACL 2-0.9 UNIT/ML-% IJ SOLN
INTRAMUSCULAR | Status: AC
  Filled 2016-01-05: qty 1000

## 2016-01-05 SURGICAL SUPPLY — 18 items
BALLN TREK RX 2.5X12 (BALLOONS) ×3
BALLOON TREK RX 2.5X12 (BALLOONS) ×2 IMPLANT
CATH INFINITI 5 FR IM (CATHETERS) ×3 IMPLANT
CATH INFINITI 5FR ANG PIGTAIL (CATHETERS) ×3 IMPLANT
CATH INFINITI 5FR JL4 (CATHETERS) ×3 IMPLANT
CATH INFINITI JR4 5F (CATHETERS) ×3 IMPLANT
CATH VISTA GUIDE 6FR JR4 (CATHETERS) ×3 IMPLANT
DEVICE CLOSURE MYNXGRIP 6/7F (Vascular Products) ×3 IMPLANT
DEVICE INFLAT 30 PLUS (MISCELLANEOUS) ×3 IMPLANT
DEVICE SAFEGUARD 24CM (GAUZE/BANDAGES/DRESSINGS) ×3 IMPLANT
KIT MANI 3VAL PERCEP (MISCELLANEOUS) ×3 IMPLANT
NEEDLE PERC 18GX7CM (NEEDLE) ×3 IMPLANT
PACK CARDIAC CATH (CUSTOM PROCEDURE TRAY) ×3 IMPLANT
SHEATH AVANTI 5FR X 11CM (SHEATH) ×3 IMPLANT
SHEATH AVANTI 6FR X 11CM (SHEATH) ×3 IMPLANT
STENT RESOLUTE INTEG 2.75X12 (Permanent Stent) ×3 IMPLANT
WIRE ASAHI PROWATER 180CM (WIRE) ×3 IMPLANT
WIRE EMERALD 3MM-J .035X150CM (WIRE) ×3 IMPLANT

## 2016-01-05 NOTE — Progress Notes (Signed)
Dr. Clayborn Bigness gave orders to hold manual pressure for 15 minutes which was done 1900-1915 and reinsert 40cc of air into PAD. Per MD, leave at 40cc for 4 hours. Site remains soft, bruising marked by pen on skin. No complaints of pain. Pulses and vitals stable. Bedside report with Yasmin RN.

## 2016-01-05 NOTE — Progress Notes (Signed)
PAD device has been completely deflated. Patient has tolerated this procedure well. Currently eating lunch. Report has been called to telemetry floor. Will transport after patient is done eating.

## 2016-01-05 NOTE — Progress Notes (Signed)
Vascular site beginning to bruise and ooze a scant amount of bright red blood visible under deflated PAD. Surrounding site remains soft. Coralyn Mark, RN from cath lab up for another patient stopped in to check it. Dr. Clayborn Bigness paged.

## 2016-01-06 DIAGNOSIS — I25709 Atherosclerosis of coronary artery bypass graft(s), unspecified, with unspecified angina pectoris: Secondary | ICD-10-CM | POA: Diagnosis not present

## 2016-01-06 LAB — CBC
HEMATOCRIT: 37.4 % — AB (ref 40.0–52.0)
HEMOGLOBIN: 13.4 g/dL (ref 13.0–18.0)
MCH: 33.5 pg (ref 26.0–34.0)
MCHC: 35.8 g/dL (ref 32.0–36.0)
MCV: 93.5 fL (ref 80.0–100.0)
Platelets: 185 10*3/uL (ref 150–440)
RBC: 4 MIL/uL — AB (ref 4.40–5.90)
RDW: 12.9 % (ref 11.5–14.5)
WBC: 7.4 10*3/uL (ref 3.8–10.6)

## 2016-01-06 LAB — BASIC METABOLIC PANEL
ANION GAP: 7 (ref 5–15)
BUN: 23 mg/dL — ABNORMAL HIGH (ref 6–20)
CALCIUM: 8.4 mg/dL — AB (ref 8.9–10.3)
CO2: 21 mmol/L — ABNORMAL LOW (ref 22–32)
Chloride: 108 mmol/L (ref 101–111)
Creatinine, Ser: 0.69 mg/dL (ref 0.61–1.24)
GLUCOSE: 107 mg/dL — AB (ref 65–99)
POTASSIUM: 3.9 mmol/L (ref 3.5–5.1)
Sodium: 136 mmol/L (ref 135–145)

## 2016-01-06 MED ORDER — CLOPIDOGREL BISULFATE 75 MG PO TABS: 75.0000 mg | ORAL_TABLET | Freq: Every day | ORAL | 4 refills | Status: AC

## 2016-01-06 MED ORDER — ATORVASTATIN CALCIUM 80 MG PO TABS: 80.0000 mg | ORAL_TABLET | Freq: Every day | ORAL | 4 refills | Status: AC

## 2016-01-06 MED ORDER — ISOSORBIDE MONONITRATE ER 30 MG PO TB24: 30.0000 mg | ORAL_TABLET | Freq: Every day | ORAL | 4 refills | Status: DC

## 2016-01-06 NOTE — Progress Notes (Signed)
Patient discharged to home. PAD deflated last night and removed this a.m. Band aid applied. No bleeding, site is soft, bruised and pain free. All distal pulses are strong.  Vitals stable. IVs and telemetry discontinued and removed. MD follow up appt given to patient.  Education given to patient

## 2016-01-06 NOTE — Discharge Summary (Signed)
[facility] Discharge Summary  Patient ID: Timothy Bass MRN: [id number] DOB/AGE: 80/80/1934 80 y.o.  Admit date: 01/05/2016 Discharge date: 01/06/2016  Primary Discharge Diagnosis: Coronary artery bypass graft with angina I25.798 Secondary Discharge Diagnosis high blood pressure and high cholesterol  Significant Diagnostic Studies: Cardiac cath with left ventricular angiogram and selective coronary injection as well as PCI and stent placement of graft to PDA.  Hospital Course: The patient was admitted to specials for cardiac cath with selective coronary angiogram after full consent, risk and benefits explained, and time out called with all approprate details voiced and discussed. The patient has had progressive canadian class 4 angina without intermediate risk stress test consistent with ischemic chest pain and or anginal equivalent with coronary artery risk factors including high blood pressure and high cholesterol. The procedure was performed without complication and it revealed normal left ventricular function with ejection fraction of 55%.  It was found that the patient had severe 3 vessel coronary atherosclerosis with significant graft to pda stenosis requiring further intervention. Therefore, the patient had a PCI and drug eluding stent placed without complication. The patient has been ambulating without further significant symptoms and has reached his maximal hospital benefit and will be discharged to home in good condition.  Cardiac rehabilitation has been discussed and recommended. Medication management of cardiovascular risk factors will be given post discharge and modified as an outpatient.   Discharge Exam: Blood pressure 131/63, pulse 65, temperature 97.9 F (36.6 C), temperature source Oral, resp. rate 19, height 5\' 7"  (1.702 m), weight 185 lb 9.6 oz (84.2 kg), SpO2 94 %.  Constitutional: Alet oriented to person, place, and time. No distress.  HENT: No nasal  discharge.  Head: Normocephalic and atraumatic.  Eyes: Pupils are equal and round. No discharge.  Neck: Normal range of motion. Neck supple. No JVD present. No thyromegaly present.  Cardiovascular: Normal rate, regular rhythm, normal S1 S2, no gallop, no friction rub. No murmur Pulmonary/Chest: Effort normal, No stridor. No respiratory distress. no wheezes.  no rales.    Abdominal: Soft. Bowel sounds are normal.  no distension.  no tenderness. There is no rebound and no guarding.  Musculoskeletal: No edema, no cyanosis, normal pulses, no bleeding, Normal range of motion. no tenderness.  Neurological:  alert and oriented to person, place, and time. Coordination normal.  Skin: Skin is warm and dry. No rash noted. No erythema. No pallor.  Psychiatric:  normal mood and affect. behavior is normal.    Labs:   Lab Results  Component Value Date   WBC 7.4 01/06/2016   HGB 13.4 01/06/2016   HCT 37.4 (L) 01/06/2016   MCV 93.5 01/06/2016   PLT 185 01/06/2016     Recent Labs Lab 01/06/16 0407  NA 136  K 3.9  CL 108  CO2 21*  BUN 23*  CREATININE 0.69  CALCIUM 8.4*  GLUCOSE 107*    EKG: NSR without evidence of new changes  FOLLOW UP IN ONE TO TWO WEEKS    Medication List    STOP taking these medications   atenolol 25 MG tablet Commonly known as:  TENORMIN   bismuth subsalicylate 99991111 MG chewable tablet Commonly known as:  PEPTO BISMOL   captopril 50 MG tablet Commonly known as:  CAPOTEN   Cholecalciferol 1000 units Tbdp   gemfibrozil 600 MG tablet Commonly known as:  LOPID   omeprazole 40 MG capsule Commonly known as:  PRILOSEC   pantoprazole 40 MG tablet Commonly known as:  PROTONIX   pravastatin 80 MG tablet Commonly known as:  PRAVACHOL     TAKE these medications   amLODipine-benazepril 5-10 MG capsule Commonly known as:  LOTREL   aspirin 81 MG tablet Take 81 mg by mouth daily.   atorvastatin 80 MG tablet Commonly known as:  LIPITOR Take 1 tablet (80  mg total) by mouth daily.   clopidogrel 75 MG tablet Commonly known as:  PLAVIX Take 1 tablet (75 mg total) by mouth daily with breakfast.   finasteride 5 MG tablet Commonly known as:  PROSCAR TAKE 1 TABLET (5 MG TOTAL) BY MOUTH DAILY.   glucosamine-chondroitin 500-400 MG tablet Take 1 tablet by mouth 3 (three) times daily.   isosorbide mononitrate 30 MG 24 hr tablet Commonly known as:  IMDUR Take 1 tablet (30 mg total) by mouth daily.   mometasone 0.1 % lotion Commonly known as:  ELOCON INSTILL 4 DROPS TO EACH EAR 1 TO 2 TIMES PER WEEK AS NEEDED FOR DRY SKIN AND ITCHING   MULTI-VITAMINS Tabs Take 1 tablet by mouth daily.   omega-3 acid ethyl esters 1 g capsule Commonly known as:  LOVAZA Take by mouth 2 (two) times daily.   tamsulosin 0.4 MG Caps capsule Commonly known as:  FLOMAX Take 1 capsule (0.4 mg total) by mouth daily.   VESICARE 5 MG tablet Generic drug:  solifenacin TAKE 1 TABLET (5 MG TOTAL) BY MOUTH DAILY.        THE PATIENT  SHALL BRING ALL MEDICATIONS TO FOLLOW UP APPOINTMENT  Signed:  Corey Skains MD, [facility] 01/06/2016, 8:30 AM

## 2016-06-18 ENCOUNTER — Other Ambulatory Visit: Payer: Self-pay | Admitting: Urology

## 2016-06-18 DIAGNOSIS — N400 Enlarged prostate without lower urinary tract symptoms: Secondary | ICD-10-CM

## 2016-06-19 NOTE — Telephone Encounter (Signed)
Pt pharmacy sent a refill request of finasteride. 30 days with no refills were given. Pt needs appt for further refills.

## 2016-07-16 ENCOUNTER — Other Ambulatory Visit: Payer: Self-pay | Admitting: Urology

## 2016-07-16 DIAGNOSIS — N400 Enlarged prostate without lower urinary tract symptoms: Secondary | ICD-10-CM

## 2016-07-25 ENCOUNTER — Other Ambulatory Visit: Payer: Self-pay | Admitting: Urology

## 2016-07-25 DIAGNOSIS — N400 Enlarged prostate without lower urinary tract symptoms: Secondary | ICD-10-CM

## 2016-07-25 NOTE — Telephone Encounter (Signed)
Pt pharmacy sent a refill for finasteride. Medication not refilled. Pt has not been seen in a year.

## 2016-07-30 NOTE — Progress Notes (Signed)
11:39 AM   Rockwell Germany [birth year] [id number]  Referring provider: Juluis Pitch, MD [phone] [place], [place] [address]  Chief Complaint  Patient presents with  . Urinary Frequency    HPI: 83 yo AM with BPH and LUTS who returns today for routine follow up.    His IPSS score today is 14, which is moderate lower urinary tract symptomatology.  He is mostly satisfied with his quality life due to his urinary symptoms. His PVR is 54 mL.      His major complaint today needing refills on his medications for his urinary frequency.  He denies any dysuria, hematuria or suprapubic pain.   He currently taking finasteride 5 mg daily and Vesicare 5 mg daily.     He also denies any recent fevers, chills, nausea or vomiting.       IPSS    Row Name 07/31/16 1100         International Prostate Symptom Score   How often have you had the sensation of not emptying your bladder? Less than half the time     How often have you had to urinate less than every two hours? Less than 1 in 5 times     How often have you found you stopped and started again several times when you urinated? Less than half the time     How often have you found it difficult to postpone urination? Less than half the time     How often have you had a weak urinary stream? Less than half the time     How often have you had to strain to start urination? Less than half the time     How many times did you typically get up at night to urinate? 3 Times     Total IPSS Score 14       Quality of Life due to urinary symptoms   If you were to spend the rest of your life with your urinary condition just the way it is now how would you feel about that? Mostly Satisfied        Score:  1-7 Mild 8-19 Moderate 20-35 Severe  PMH: Past Medical History:  Diagnosis Date  . BPH without obstruction/lower urinary tract symptoms   . Coronary artery disease   . Dysuria   . Enlarged prostate   . GERD (gastroesophageal reflux  disease)   . Hypercholesteremia   . Hyperglycemia   . Hyperlipemia   . Hypertension   . Nocturia   . Prostatic hypertrophy   . Urinary frequency   . Urinary incontinence     Surgical History: Past Surgical History:  Procedure Laterality Date  . CARDIAC CATHETERIZATION N/A 01/05/2016   Procedure: LEFT HEART CATH AND CORS/GRAFTS ANGIOGRAPHY;  Surgeon: Corey Skains, MD;  Location: [facility];  Service: Cardiovascular;  Laterality: N/A;  . CARDIAC CATHETERIZATION N/A 01/05/2016   Procedure: Coronary Stent Intervention;  Surgeon: Isaias Cowman, MD;  Location: [facility];  Service: Cardiovascular;  Laterality: N/A;  . CORONARY ARTERY BYPASS GRAFT     x2  . ESOPHAGOGASTRODUODENOSCOPY (EGD) WITH PROPOFOL N/A 05/27/2015   Procedure: ESOPHAGOGASTRODUODENOSCOPY (EGD) WITH PROPOFOL;  Surgeon: Hulen Luster, MD;  Location: [facility] ENDOSCOPY;  Service: Gastroenterology;  Laterality: N/A;  . VASECTOMY      Home Medications:  Allergies as of 07/31/2016      Reactions   Aspirin    Pt.states aspirin gives pt. iigh blood pressure  Peanuts [peanut Oil]       Medication List       Accurate as of 07/31/16 11:39 AM. Always use your most recent med list.          amLODipine-benazepril 5-10 MG capsule Commonly known as:  LOTREL   aspirin 81 MG tablet Take 81 mg by mouth daily.   atorvastatin 80 MG tablet Commonly known as:  LIPITOR Take 1 tablet (80 mg total) by mouth daily.   clopidogrel 75 MG tablet Commonly known as:  PLAVIX Take 1 tablet (75 mg total) by mouth daily with breakfast.   finasteride 5 MG tablet Commonly known as:  PROSCAR Take 1 tablet (5 mg total) by mouth daily.   glucosamine-chondroitin 500-400 MG tablet Take 1 tablet by mouth 3 (three) times daily.   isosorbide mononitrate 30 MG 24 hr tablet Commonly known as:  IMDUR Take 1 tablet (30 mg total) by mouth daily.   mometasone 0.1 % lotion Commonly known as:  ELOCON INSTILL 4 DROPS TO EACH  EAR 1 TO 2 TIMES PER WEEK AS NEEDED FOR DRY SKIN AND ITCHING   MULTI-VITAMINS Tabs Take 1 tablet by mouth daily.   omega-3 acid ethyl esters 1 g capsule Commonly known as:  LOVAZA Take by mouth 2 (two) times daily.   solifenacin 5 MG tablet Commonly known as:  VESICARE Take 1 tablet (5 mg total) by mouth daily.   tamsulosin 0.4 MG Caps capsule Commonly known as:  FLOMAX Take 1 capsule (0.4 mg total) by mouth daily.       Allergies:  Allergies  Allergen Reactions  . Aspirin     Pt.states aspirin gives pt. iigh blood pressure  . Peanuts [Peanut Oil]     Family History: Family History  Problem Relation Age of Onset  . Family history unknown: Yes    Social History:  reports that he quit smoking about 23 years ago. His smoking use included Cigarettes. He has a 30.00 pack-year smoking history. He has never used smokeless tobacco. He reports that he does not drink alcohol or use drugs.  ROS: UROLOGY Frequent Urination?: No Hard to postpone urination?: No Burning/pain with urination?: No Get up at night to urinate?: No Leakage of urine?: No Urine stream starts and stops?: No Trouble starting stream?: No Do you have to strain to urinate?: No Blood in urine?: No Urinary tract infection?: No Sexually transmitted disease?: No Injury to kidneys or bladder?: No Painful intercourse?: No Weak stream?: No Erection problems?: No Penile pain?: No Gastrointestinal Nausea?: No Vomiting?: No Indigestion/heartburn?: No Diarrhea?: No Constipation?: No Constitutional Fever: No Night sweats?: No Weight loss?: No Fatigue?: No Skin Skin rash/lesions?: No Itching?: No Eyes Blurred vision?: No Double vision?: No Ears/Nose/Throat Sore throat?: No Sinus problems?: No Hematologic/Lymphatic Swollen glands?: No Easy bruising?: No Cardiovascular Leg swelling?: No Chest pain?: No Respiratory Cough?: No Shortness of breath?: No Endocrine Excessive thirst?:  No Musculoskeletal Back pain?: No Joint pain?: No Neurological Headaches?: No Dizziness?: No Psychologic Depression?: No Anxiety?: No    ROS  Physical Exam: BP (!) 116/57 (BP Location: Left Arm, Patient Position: Sitting, Cuff Size: Normal)   Pulse 94   Ht 5\' 7"  (1.702 m)   Wt 184 lb 4.8 oz (83.6 kg)   BMI 28.87 kg/m   Constitutional: Well nourished. Alert and oriented, No acute distress. HEENT: [place] AT, moist mucus membranes. Trachea midline, no masses. Cardiovascular: No clubbing, cyanosis, or edema. Respiratory: Normal respiratory effort, no increased work of breathing. GI: Abdomen  is soft, non tender, non distended, no abdominal masses. Liver and spleen not palpable.  No hernias appreciated.  Stool sample for occult testing is not indicated.   GU: No CVA tenderness.  No bladder fullness or masses.  Patient with uncircumcised phallus.  Foreskin easily retracted Urethral meatus is patent.  No penile discharge. No penile lesions or rashes. Scrotum without lesions, cysts, rashes and/or edema.  Testicles are located scrotally bilaterally. No masses are appreciated in the testicles. Left and right epididymis are normal. Rectal: Patient with  normal sphincter tone. Anus and perineum without scarring or rashes. No rectal masses are appreciated. Prostate is approximately 45 grams, no nodules are appreciated. Seminal vesicles are normal. Skin: No rashes, bruises or suspicious lesions. Lymph: No cervical or inguinal adenopathy. Neurologic: Grossly intact, no focal deficits, moving all 4 extremities. Psychiatric: Normal mood and affect.  Laboratory Data:  Results for orders placed or performed in visit on 07/31/16  Bladder Scan (Post Void Residual) in office  Result Value Ref Range   Scan Result 54     Assessment & Plan:    1. BPH with LUTS  - IPSS score is 14/2  - Continue conservative management, avoiding bladder irritants and timed voiding's  - Continue finasteride 5 mg  daily:refills given  - RTC in 12 months for IPS'S, PVR and exam    2. Urinary frequency Continue Vesicare 5 mg; refills given - BLADDER SCAN AMB NON-IMAGING  3. History of incomplete bladder emptying History of previously elevated PVRs.  PVR today acceptable on Vesicare 5mg     Return in about 1 year (around 07/31/2017) for IPSS, PVR and exam.  Zara Council, [facility]  [place] [address], [place], [place] [address] [phone]

## 2016-07-31 ENCOUNTER — Ambulatory Visit (INDEPENDENT_AMBULATORY_CARE_PROVIDER_SITE_OTHER): Payer: Medicare Other | Admitting: Urology

## 2016-07-31 ENCOUNTER — Other Ambulatory Visit: Payer: Self-pay | Admitting: Urology

## 2016-07-31 ENCOUNTER — Encounter: Payer: Self-pay | Admitting: Urology

## 2016-07-31 VITALS — BP 116/57 | HR 94 | Ht 67.0 in | Wt 184.3 lb

## 2016-07-31 DIAGNOSIS — R350 Frequency of micturition: Secondary | ICD-10-CM | POA: Diagnosis not present

## 2016-07-31 DIAGNOSIS — N138 Other obstructive and reflux uropathy: Secondary | ICD-10-CM

## 2016-07-31 DIAGNOSIS — N401 Enlarged prostate with lower urinary tract symptoms: Secondary | ICD-10-CM

## 2016-07-31 DIAGNOSIS — N400 Enlarged prostate without lower urinary tract symptoms: Secondary | ICD-10-CM

## 2016-07-31 LAB — BLADDER SCAN AMB NON-IMAGING: Scan Result: 54

## 2016-07-31 MED ORDER — SOLIFENACIN SUCCINATE 5 MG PO TABS
5.0000 mg | ORAL_TABLET | Freq: Every day | ORAL | 3 refills | Status: DC
Start: 2016-07-31 — End: 2019-10-17

## 2016-07-31 MED ORDER — FINASTERIDE 5 MG PO TABS: 5.0000 mg | ORAL_TABLET | Freq: Every day | ORAL | 3 refills | Status: DC

## 2016-09-04 ENCOUNTER — Other Ambulatory Visit: Payer: Self-pay

## 2016-09-04 DIAGNOSIS — N401 Enlarged prostate with lower urinary tract symptoms: Secondary | ICD-10-CM

## 2016-09-04 MED ORDER — FINASTERIDE 5 MG PO TABS: 5.0000 mg | ORAL_TABLET | Freq: Every day | ORAL | 3 refills | Status: DC

## 2016-10-10 ENCOUNTER — Telehealth: Payer: Self-pay

## 2016-10-10 NOTE — Telephone Encounter (Signed)
Pt pharmacy sent a refill request. Pharmacy stated that pt had been getting vesicare but now would like oxybutynin. Please advise.

## 2016-10-16 NOTE — Telephone Encounter (Signed)
Oxybutynin can be more severe and side effects and urinary retention.  We can start the oxybutynin at 5 mg daily and see how the patient responds to this dose.  We will need to see him and 6 weeks for I PSS score and PVR.

## 2016-10-17 NOTE — Telephone Encounter (Signed)
LMOM for pt to return call Sent oxybutynin into pharmacy.

## 2016-12-04 ENCOUNTER — Other Ambulatory Visit: Payer: Self-pay | Admitting: Urology

## 2016-12-04 NOTE — Telephone Encounter (Signed)
Pt needs a refill on Oxybutynin 5 mg.  [facility] [address].  Completely out.

## 2016-12-05 ENCOUNTER — Other Ambulatory Visit: Payer: Self-pay | Admitting: Urology

## 2016-12-05 NOTE — Progress Notes (Unsigned)
The medication that he is requesting is not listed.  I need verification on the name of the medication, dose and instructions.

## 2016-12-05 NOTE — Telephone Encounter (Signed)
Pt came back in today with empty bottle.  He is completely out of Rx and would like a refill.  Please advise.

## 2016-12-06 MED ORDER — OXYBUTYNIN CHLORIDE 5 MG PO TABS: 5.0000 mg | ORAL_TABLET | Freq: Two times a day (BID) | ORAL | 6 refills | Status: DC

## 2016-12-06 NOTE — Telephone Encounter (Signed)
Tried to call patient to find out who the physician that originally prescribed this medication was on the bottle. No answer and unable to leave a message, no machine.

## 2016-12-27 DIAGNOSIS — N390 Urinary tract infection, site not specified: Secondary | ICD-10-CM | POA: Diagnosis not present

## 2016-12-27 DIAGNOSIS — R351 Nocturia: Secondary | ICD-10-CM | POA: Diagnosis not present

## 2017-01-16 ENCOUNTER — Ambulatory Visit: Admit: 2017-01-16 | Payer: Medicare Other | Admitting: Gastroenterology

## 2017-01-16 SURGERY — COLONOSCOPY WITH PROPOFOL
Anesthesia: General

## 2017-01-25 DIAGNOSIS — M9903 Segmental and somatic dysfunction of lumbar region: Secondary | ICD-10-CM | POA: Diagnosis not present

## 2017-01-25 DIAGNOSIS — M9904 Segmental and somatic dysfunction of sacral region: Secondary | ICD-10-CM | POA: Diagnosis not present

## 2017-01-25 DIAGNOSIS — M461 Sacroiliitis, not elsewhere classified: Secondary | ICD-10-CM | POA: Diagnosis not present

## 2017-01-25 DIAGNOSIS — M545 Low back pain: Secondary | ICD-10-CM | POA: Diagnosis not present

## 2017-01-26 DIAGNOSIS — M461 Sacroiliitis, not elsewhere classified: Secondary | ICD-10-CM | POA: Diagnosis not present

## 2017-01-26 DIAGNOSIS — M9903 Segmental and somatic dysfunction of lumbar region: Secondary | ICD-10-CM | POA: Diagnosis not present

## 2017-01-26 DIAGNOSIS — M9904 Segmental and somatic dysfunction of sacral region: Secondary | ICD-10-CM | POA: Diagnosis not present

## 2017-01-26 DIAGNOSIS — M545 Low back pain: Secondary | ICD-10-CM | POA: Diagnosis not present

## 2017-01-29 DIAGNOSIS — M545 Low back pain: Secondary | ICD-10-CM | POA: Diagnosis not present

## 2017-01-29 DIAGNOSIS — M9904 Segmental and somatic dysfunction of sacral region: Secondary | ICD-10-CM | POA: Diagnosis not present

## 2017-01-29 DIAGNOSIS — M9903 Segmental and somatic dysfunction of lumbar region: Secondary | ICD-10-CM | POA: Diagnosis not present

## 2017-01-29 DIAGNOSIS — M461 Sacroiliitis, not elsewhere classified: Secondary | ICD-10-CM | POA: Diagnosis not present

## 2017-02-17 ENCOUNTER — Encounter: Payer: Self-pay | Admitting: Radiology

## 2017-02-17 ENCOUNTER — Emergency Department
Admission: EM | Admit: 2017-02-17 | Discharge: 2017-02-18 | Disposition: A | Discharge status: Home / Self Care | Payer: No Typology Code available for payment source | Attending: Emergency Medicine | Admitting: Emergency Medicine

## 2017-02-17 ENCOUNTER — Emergency Department: Payer: No Typology Code available for payment source

## 2017-02-17 DIAGNOSIS — Y939 Activity, unspecified: Secondary | ICD-10-CM | POA: Diagnosis not present

## 2017-02-17 DIAGNOSIS — Z7902 Long term (current) use of antithrombotics/antiplatelets: Secondary | ICD-10-CM | POA: Diagnosis not present

## 2017-02-17 DIAGNOSIS — Z951 Presence of aortocoronary bypass graft: Secondary | ICD-10-CM | POA: Diagnosis not present

## 2017-02-17 DIAGNOSIS — Y999 Unspecified external cause status: Secondary | ICD-10-CM | POA: Insufficient documentation

## 2017-02-17 DIAGNOSIS — Y92410 Unspecified street and highway as the place of occurrence of the external cause: Secondary | ICD-10-CM | POA: Diagnosis not present

## 2017-02-17 DIAGNOSIS — I2510 Atherosclerotic heart disease of native coronary artery without angina pectoris: Secondary | ICD-10-CM | POA: Insufficient documentation

## 2017-02-17 DIAGNOSIS — S299XXA Unspecified injury of thorax, initial encounter: Secondary | ICD-10-CM | POA: Diagnosis not present

## 2017-02-17 DIAGNOSIS — Z87891 Personal history of nicotine dependence: Secondary | ICD-10-CM | POA: Diagnosis not present

## 2017-02-17 DIAGNOSIS — I10 Essential (primary) hypertension: Secondary | ICD-10-CM | POA: Insufficient documentation

## 2017-02-17 DIAGNOSIS — Z79899 Other long term (current) drug therapy: Secondary | ICD-10-CM | POA: Diagnosis not present

## 2017-02-17 DIAGNOSIS — S2231XA Fracture of one rib, right side, initial encounter for closed fracture: Secondary | ICD-10-CM | POA: Diagnosis not present

## 2017-02-17 DIAGNOSIS — M542 Cervicalgia: Secondary | ICD-10-CM | POA: Diagnosis not present

## 2017-02-17 DIAGNOSIS — S298XXA Other specified injuries of thorax, initial encounter: Secondary | ICD-10-CM | POA: Diagnosis present

## 2017-02-17 DIAGNOSIS — Z91010 Allergy to peanuts: Secondary | ICD-10-CM | POA: Insufficient documentation

## 2017-02-17 DIAGNOSIS — I700 Atherosclerosis of aorta: Secondary | ICD-10-CM | POA: Diagnosis not present

## 2017-02-17 LAB — CBC WITH DIFFERENTIAL/PLATELET
BASOS PCT: 0 %
Basophils Absolute: 0 10*3/uL (ref 0–0.1)
Eosinophils Absolute: 0.1 10*3/uL (ref 0–0.7)
Eosinophils Relative: 3 %
HEMATOCRIT: 42.5 % (ref 40.0–52.0)
HEMOGLOBIN: 14.9 g/dL (ref 13.0–18.0)
LYMPHS PCT: 26 %
Lymphs Abs: 1.4 10*3/uL (ref 1.0–3.6)
MCH: 33.4 pg (ref 26.0–34.0)
MCHC: 35.2 g/dL (ref 32.0–36.0)
MCV: 95 fL (ref 80.0–100.0)
MONOS PCT: 9 %
Monocytes Absolute: 0.5 10*3/uL (ref 0.2–1.0)
NEUTROS ABS: 3.3 10*3/uL (ref 1.4–6.5)
NEUTROS PCT: 62 %
Platelets: 165 10*3/uL (ref 150–440)
RBC: 4.47 MIL/uL (ref 4.40–5.90)
RDW: 13.3 % (ref 11.5–14.5)
WBC: 5.3 10*3/uL (ref 3.8–10.6)

## 2017-02-17 LAB — COMPREHENSIVE METABOLIC PANEL
ALBUMIN: 4.3 g/dL (ref 3.5–5.0)
ALK PHOS: 71 U/L (ref 38–126)
ALT: 25 U/L (ref 17–63)
ANION GAP: 7 (ref 5–15)
AST: 29 U/L (ref 15–41)
BILIRUBIN TOTAL: 0.8 mg/dL (ref 0.3–1.2)
BUN: 15 mg/dL (ref 6–20)
CALCIUM: 8.8 mg/dL — AB (ref 8.9–10.3)
CO2: 27 mmol/L (ref 22–32)
CREATININE: 0.81 mg/dL (ref 0.61–1.24)
Chloride: 106 mmol/L (ref 101–111)
GFR calc Af Amer: >60 mL/min (ref >60)
GLUCOSE: 170 mg/dL — AB (ref 65–99)
Potassium: 3.4 mmol/L — ABNORMAL LOW (ref 3.5–5.1)
Sodium: 140 mmol/L (ref 135–145)
TOTAL PROTEIN: 6.7 g/dL (ref 6.5–8.1)

## 2017-02-17 LAB — TROPONIN I: Troponin I: <0.03 ng/mL (ref <0.03)

## 2017-02-17 MED ORDER — IOPAMIDOL (ISOVUE-370) INJECTION 76%
100.0000 mL | Freq: Once | INTRAVENOUS | Status: AC | PRN
  Administered 2017-02-17: 100 mL via INTRAVENOUS

## 2017-02-17 MED ORDER — ACETAMINOPHEN 500 MG PO TABS
1000.0000 mg | ORAL_TABLET | Freq: Four times a day (QID) | ORAL | Status: DC | PRN
  Administered 2017-02-17: 1000 mg via ORAL
  Filled 2017-02-17: qty 2
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
## 2017-02-17 NOTE — Discharge Instructions (Addendum)
Please follow-up with your regular doctor in the next 2-3 days. Return here for worse pain fever or shortness of breath or any other problems. Return here also for any new pain that you did not tell us about in the emergency room. Use Advil or Tylenol for the chest wall pain. if you need more pain medicine take the hydrocodone one pill 4 times a day but be carefully can make you sleepy and constipated. Do not fall.

## 2017-02-18 DIAGNOSIS — M542 Cervicalgia: Secondary | ICD-10-CM | POA: Diagnosis not present

## 2017-02-18 DIAGNOSIS — S2231XA Fracture of one rib, right side, initial encounter for closed fracture: Secondary | ICD-10-CM | POA: Diagnosis not present

## 2017-02-18 MED ORDER — OXYCODONE HCL 5 MG PO TABS
5.0000 mg | ORAL_TABLET | ORAL | Status: AC
  Administered 2017-02-18: 5 mg via ORAL
  Filled 2017-02-18: qty 1

## 2017-02-18 MED ORDER — OXYCODONE-ACETAMINOPHEN 5-325 MG PO TABS: 1.0000 | ORAL_TABLET | Freq: Four times a day (QID) | ORAL | 0 refills | Status: DC | PRN

## 2017-02-18 NOTE — ED Notes (Signed)
Patient transported to CT 

## 2017-02-18 NOTE — ED Provider Notes (Signed)
-----------------------------------------   12:47 AM on 02/18/2017 -----------------------------------------   Blood pressure 109/67, pulse 87, temperature 97.7 F (36.5 C), temperature source Oral, resp. rate 19, height 5\' 6"  (1.676 m), weight 79.4 kg (175 lb), SpO2 90 %.  Assuming care from Dr. Cinda Quest.  In short, Timothy Bass is a 83 y.o. male with a chief complaint of Marine scientist .  Refer to the original H&P for additional details.  The current plan of care is to follow up the results of the CT cervical spine.   Clinical Course as of Feb 18 46  Sun Feb 18, 2017  0046 1. No acute fracture or dislocation of the cervical spine. 2. Acute left posterior first rib fracture. 3. Mild cervical spondylosis at the C5-C7 levels. No high-grade canal stenosis.   CT Cervical Spine Wo Contrast [AW]    Clinical Course User Index [AW] Loney Hering, MD   Although the patient has a posterior first rib fracture he has no cervical spine fracture. I did give the patient a dose of oxycodone. I will discharge patient to home with a prescription for pain medication and to follow-up with his primary care physician.   Loney Hering, MD 02/18/17 [phone]

## 2017-02-26 DIAGNOSIS — S2231XA Fracture of one rib, right side, initial encounter for closed fracture: Secondary | ICD-10-CM | POA: Diagnosis not present

## 2017-03-08 DIAGNOSIS — K5900 Constipation, unspecified: Secondary | ICD-10-CM | POA: Diagnosis not present

## 2017-03-08 DIAGNOSIS — R634 Abnormal weight loss: Secondary | ICD-10-CM | POA: Diagnosis not present

## 2017-03-08 DIAGNOSIS — R194 Change in bowel habit: Secondary | ICD-10-CM | POA: Diagnosis not present

## 2017-04-17 DIAGNOSIS — I25810 Atherosclerosis of coronary artery bypass graft(s) without angina pectoris: Secondary | ICD-10-CM | POA: Diagnosis not present

## 2017-04-17 DIAGNOSIS — I10 Essential (primary) hypertension: Secondary | ICD-10-CM | POA: Diagnosis not present

## 2017-04-17 DIAGNOSIS — E7801 Familial hypercholesterolemia: Secondary | ICD-10-CM | POA: Diagnosis not present

## 2017-05-23 DIAGNOSIS — H35373 Puckering of macula, bilateral: Secondary | ICD-10-CM | POA: Diagnosis not present

## 2017-06-25 ENCOUNTER — Other Ambulatory Visit: Payer: Self-pay | Admitting: Urology

## 2017-07-02 ENCOUNTER — Ambulatory Visit: Payer: Medicare HMO | Admitting: Certified Registered"

## 2017-07-02 ENCOUNTER — Ambulatory Visit
Admission: RE | Admit: 2017-07-02 | Discharge: 2017-07-02 | Disposition: A | Discharge status: Home / Self Care | Payer: Medicare HMO | Source: Ambulatory Visit | Attending: Gastroenterology | Admitting: Gastroenterology

## 2017-07-02 ENCOUNTER — Encounter
Admission: RE | Disposition: A | Discharge status: Home / Self Care | Payer: Self-pay | Source: Ambulatory Visit | Attending: Gastroenterology

## 2017-07-02 DIAGNOSIS — D126 Benign neoplasm of colon, unspecified: Secondary | ICD-10-CM | POA: Diagnosis not present

## 2017-07-02 DIAGNOSIS — Z7902 Long term (current) use of antithrombotics/antiplatelets: Secondary | ICD-10-CM | POA: Diagnosis not present

## 2017-07-02 DIAGNOSIS — R634 Abnormal weight loss: Secondary | ICD-10-CM | POA: Insufficient documentation

## 2017-07-02 DIAGNOSIS — I10 Essential (primary) hypertension: Secondary | ICD-10-CM | POA: Diagnosis not present

## 2017-07-02 DIAGNOSIS — Z87891 Personal history of nicotine dependence: Secondary | ICD-10-CM | POA: Insufficient documentation

## 2017-07-02 DIAGNOSIS — Z951 Presence of aortocoronary bypass graft: Secondary | ICD-10-CM | POA: Diagnosis not present

## 2017-07-02 DIAGNOSIS — Z79899 Other long term (current) drug therapy: Secondary | ICD-10-CM | POA: Diagnosis not present

## 2017-07-02 DIAGNOSIS — Z91010 Allergy to peanuts: Secondary | ICD-10-CM | POA: Insufficient documentation

## 2017-07-02 DIAGNOSIS — Z955 Presence of coronary angioplasty implant and graft: Secondary | ICD-10-CM | POA: Diagnosis not present

## 2017-07-02 DIAGNOSIS — K5900 Constipation, unspecified: Secondary | ICD-10-CM | POA: Diagnosis not present

## 2017-07-02 DIAGNOSIS — K640 First degree hemorrhoids: Secondary | ICD-10-CM | POA: Insufficient documentation

## 2017-07-02 DIAGNOSIS — Z7982 Long term (current) use of aspirin: Secondary | ICD-10-CM | POA: Insufficient documentation

## 2017-07-02 DIAGNOSIS — D124 Benign neoplasm of descending colon: Secondary | ICD-10-CM | POA: Insufficient documentation

## 2017-07-02 DIAGNOSIS — I252 Old myocardial infarction: Secondary | ICD-10-CM | POA: Insufficient documentation

## 2017-07-02 DIAGNOSIS — N400 Enlarged prostate without lower urinary tract symptoms: Secondary | ICD-10-CM | POA: Diagnosis not present

## 2017-07-02 DIAGNOSIS — Z886 Allergy status to analgesic agent status: Secondary | ICD-10-CM | POA: Diagnosis not present

## 2017-07-02 DIAGNOSIS — K5730 Diverticulosis of large intestine without perforation or abscess without bleeding: Secondary | ICD-10-CM | POA: Diagnosis not present

## 2017-07-02 DIAGNOSIS — E7800 Pure hypercholesterolemia, unspecified: Secondary | ICD-10-CM | POA: Insufficient documentation

## 2017-07-02 DIAGNOSIS — K648 Other hemorrhoids: Secondary | ICD-10-CM | POA: Diagnosis not present

## 2017-07-02 DIAGNOSIS — E785 Hyperlipidemia, unspecified: Secondary | ICD-10-CM | POA: Insufficient documentation

## 2017-07-02 DIAGNOSIS — I2510 Atherosclerotic heart disease of native coronary artery without angina pectoris: Secondary | ICD-10-CM | POA: Diagnosis not present

## 2017-07-02 DIAGNOSIS — K219 Gastro-esophageal reflux disease without esophagitis: Secondary | ICD-10-CM | POA: Diagnosis not present

## 2017-07-02 DIAGNOSIS — D125 Benign neoplasm of sigmoid colon: Secondary | ICD-10-CM | POA: Insufficient documentation

## 2017-07-02 DIAGNOSIS — K5790 Diverticulosis of intestine, part unspecified, without perforation or abscess without bleeding: Secondary | ICD-10-CM | POA: Diagnosis not present

## 2017-07-02 DIAGNOSIS — K635 Polyp of colon: Secondary | ICD-10-CM | POA: Diagnosis not present

## 2017-07-02 DIAGNOSIS — R351 Nocturia: Secondary | ICD-10-CM | POA: Insufficient documentation

## 2017-07-02 HISTORY — DX: Acute myocardial infarction, unspecified: I21.9

## 2017-07-02 HISTORY — PX: COLONOSCOPY WITH PROPOFOL: SHX5780

## 2017-07-02 SURGERY — COLONOSCOPY WITH PROPOFOL
Anesthesia: General

## 2017-07-02 MED ORDER — PROPOFOL 500 MG/50ML IV EMUL
INTRAVENOUS | Status: DC | PRN
  Administered 2017-07-02: 120 ug/kg/min via INTRAVENOUS

## 2017-07-02 MED ORDER — SODIUM CHLORIDE 0.9 % IV SOLN: INTRAVENOUS | Status: DC

## 2017-07-02 MED ORDER — PROPOFOL 10 MG/ML IV BOLUS
INTRAVENOUS | Status: DC | PRN
  Administered 2017-07-02: 30 mg via INTRAVENOUS
  Administered 2017-07-02: 20 mg via INTRAVENOUS
  Administered 2017-07-02: 50 mg via INTRAVENOUS

## 2017-07-02 MED ORDER — SODIUM CHLORIDE 0.9 % IV SOLN
INTRAVENOUS | Status: DC
  Administered 2017-07-02: 09:00:00 via INTRAVENOUS

## 2017-07-02 MED ORDER — LIDOCAINE 2% (20 MG/ML) 5 ML SYRINGE
INTRAMUSCULAR | Status: DC | PRN
  Administered 2017-07-02: 25 mg via INTRAVENOUS

## 2017-07-02 MED ORDER — PHENYLEPHRINE HCL 10 MG/ML IJ SOLN
INTRAMUSCULAR | Status: DC | PRN
  Administered 2017-07-02: 100 ug via INTRAVENOUS

## 2017-07-02 NOTE — H&P (Signed)
Outpatient short stay form Pre-procedure 07/02/2017 9:37 AM Timothy Sails MD  Primary Physician: Dr. Juluis Pitch  Reason for visit: Colonoscopy  History of present illness: Patient is a 84 year old male presenting today as above.  Has personal history of increasing issues with constipation recently.  He is noted no blood in the stool.  He does have a history of coronary artery bypass grafting in 1995 and stent placement in August 2017.  He does take Plavix and aspirin but has held those for the past 6 days.  He takes no other blood thinning agent.  He tolerated his prep well.    Current Facility-Administered Medications:  .  0.9 %  sodium chloride infusion, , Intravenous, Continuous, Timothy Sails, MD, Last Rate: 20 mL/hr at 07/02/17 0929 .  0.9 %  sodium chloride infusion, , Intravenous, Continuous, Timothy Sails, MD  Medications Prior to Admission  Medication Sig Dispense Refill Last Dose  . amLODipine-benazepril (LOTREL) 5-10 MG capsule    07/02/2017 at Unknown time  . aspirin 81 MG tablet Take 81 mg by mouth daily.   06/25/2017  . atorvastatin (LIPITOR) 80 MG tablet Take 1 tablet (80 mg total) by mouth daily. 90 tablet 4 06/30/2017  . clopidogrel (PLAVIX) 75 MG tablet Take 1 tablet (75 mg total) by mouth daily with breakfast. 90 tablet 4 06/24/2017 at Unknown time  . finasteride (PROSCAR) 5 MG tablet Take 1 tablet (5 mg total) by mouth daily. 90 tablet 3 07/01/2017 at Unknown time  . gemfibrozil (LOPID) 600 MG tablet Take 600 mg by mouth 2 (two) times daily before a meal.   07/01/2017 at Unknown time  . glucosamine-chondroitin 500-400 MG tablet Take 1 tablet by mouth 3 (three) times daily.   07/01/2017 at Unknown time  . ipratropium (ATROVENT) 0.03 % nasal spray Place 2 sprays into both nostrils every 12 (twelve) hours.   07/01/2017 at Unknown time  . isosorbide mononitrate (IMDUR) 30 MG 24 hr tablet Take 1 tablet (30 mg total) by mouth daily. 90 tablet 4 Taking  . mometasone  (ELOCON) 0.1 % lotion INSTILL 4 DROPS TO EACH EAR 1 TO 2 TIMES PER WEEK AS NEEDED FOR DRY SKIN AND ITCHING  12 07/01/2017 at Unknown time  . Multiple Vitamin (MULTI-VITAMINS) TABS Take 1 tablet by mouth daily.    07/01/2017 at Unknown time  . omega-3 acid ethyl esters (LOVAZA) 1 g capsule Take by mouth 2 (two) times daily.   07/01/2017 at Unknown time  . oxybutynin (DITROPAN) 5 MG tablet Take 1 tablet (5 mg total) by mouth 2 (two) times daily. 60 tablet 6 07/01/2017 at Unknown time  . solifenacin (VESICARE) 5 MG tablet Take 1 tablet (5 mg total) by mouth daily. 90 tablet 3 07/01/2017 at Unknown time  . terazosin (HYTRIN) 2 MG capsule Take 2 mg by mouth at bedtime.   07/01/2017 at Unknown time  . oxyCODONE-acetaminophen (ROXICET) 5-325 MG tablet Take 1 tablet by mouth every 6 (six) hours as needed. (Patient not taking: Reported on 07/02/2017) 12 tablet 0 Completed Course at Unknown time  . tamsulosin (FLOMAX) 0.4 MG CAPS capsule Take 1 capsule (0.4 mg total) by mouth daily. (Patient not taking: Reported on 07/31/2016) 90 capsule 4 Not Taking at Unknown time     Allergies  Allergen Reactions  . Aspirin     Pt.states aspirin gives pt. high blood pressure  . Peanuts [Peanut Oil]      Past Medical History:  Diagnosis Date  . BPH without  obstruction/lower urinary tract symptoms   . Coronary artery disease   . Dysuria   . Enlarged prostate   . GERD (gastroesophageal reflux disease)   . Hypercholesteremia   . Hyperglycemia   . Hyperlipemia   . Hypertension   . Myocardial infarction ([place])   . Nocturia   . Prostatic hypertrophy   . Urinary frequency   . Urinary incontinence     Review of systems:      Physical Exam    Heart and lungs: Regular rate and rhythm without rub or gallop, lungs are bilaterally clear.    HEENT: Normocephalic atraumatic eyes are anicteric    Other:    Pertinant exam for procedure: Soft nontender nondistended bowel sounds positive normoactive.    Planned  proceedures: Colonoscopy and indicated procedures. I have discussed the risks benefits and complications of procedures to include not limited to bleeding, infection, perforation and the risk of sedation and the patient wishes to proceed.    Timothy Sails, MD Gastroenterology 07/02/2017  9:37 AM

## 2017-07-02 NOTE — Anesthesia Post-op Follow-up Note (Signed)
Anesthesia QCDR form completed.        

## 2017-07-02 NOTE — Anesthesia Preprocedure Evaluation (Signed)
Anesthesia Evaluation  Patient identified by MRN, date of birth, ID band Patient awake    Reviewed: Allergy & Precautions, H&P , NPO status , Patient's Chart, lab work & pertinent test results, reviewed documented beta blocker date and time   Airway Mallampati: II   Neck ROM: full    Dental  (+) Poor Dentition   Pulmonary neg pulmonary ROS, former smoker,    Pulmonary exam normal        Cardiovascular hypertension, + angina with exertion + CAD, + Past MI and + CABG  negative cardio ROS Normal cardiovascular exam Rhythm:regular Rate:Normal     Neuro/Psych negative neurological ROS  negative psych ROS   GI/Hepatic negative GI ROS, Neg liver ROS, GERD  Medicated,  Endo/Other  negative endocrine ROS  Renal/GU negative Renal ROS  negative genitourinary   Musculoskeletal   Abdominal   Peds  Hematology negative hematology ROS (+)   Anesthesia Other Findings Past Medical History: No date: BPH without obstruction/lower urinary tract symptoms No date: Coronary artery disease No date: Dysuria No date: Enlarged prostate No date: GERD (gastroesophageal reflux disease) No date: Hypercholesteremia No date: Hyperglycemia No date: Hyperlipemia No date: Hypertension No date: Myocardial infarction ([place]) No date: Nocturia No date: Prostatic hypertrophy No date: Urinary frequency No date: Urinary incontinence Past Surgical History: 01/05/2016: CARDIAC CATHETERIZATION; N/A     Comment:  Procedure: LEFT HEART CATH AND CORS/GRAFTS ANGIOGRAPHY;               Surgeon: Corey Skains, MD;  Location: [place]               CV LAB;  Service: Cardiovascular;  Laterality: N/A; 01/05/2016: CARDIAC CATHETERIZATION; N/A     Comment:  Procedure: Coronary Stent Intervention;  Surgeon:               Isaias Cowman, MD;  Location: [place] [facility];              Service: Cardiovascular;  Laterality: N/A; 2017: cardiac  stents No date: CORONARY ARTERY BYPASS GRAFT     Comment:  x2 05/27/2015: ESOPHAGOGASTRODUODENOSCOPY (EGD) WITH PROPOFOL; N/A     Comment:  Procedure: ESOPHAGOGASTRODUODENOSCOPY (EGD) WITH               PROPOFOL;  Surgeon: Hulen Luster, MD;  Location: [facility]               ENDOSCOPY;  Service: Gastroenterology;  Laterality: N/A; No date: VASECTOMY   Reproductive/Obstetrics negative OB ROS                             Anesthesia Physical Anesthesia Plan  ASA: IV  Anesthesia Plan: General   Post-op Pain Management:    Induction:   PONV Risk Score and Plan:   Airway Management Planned:   Additional Equipment:   Intra-op Plan:   Post-operative Plan:   Informed Consent: I have reviewed the patients History and Physical, chart, labs and discussed the procedure including the risks, benefits and alternatives for the proposed anesthesia with the patient or authorized representative who has indicated his/her understanding and acceptance.   Dental Advisory Given  Plan Discussed with: CRNA  Anesthesia Plan Comments:         Anesthesia Quick Evaluation

## 2017-07-02 NOTE — Transfer of Care (Signed)
Immediate Anesthesia Transfer of Care Note  Patient: YSABEL COWGILL  Procedure(s) Performed: COLONOSCOPY WITH PROPOFOL (N/A )  Patient Location: Endoscopy Unit  Anesthesia Type:General  Level of Consciousness: awake  Airway & Oxygen Therapy: Patient Spontanous Breathing and Patient connected to nasal cannula oxygen  Post-op Assessment: Report given to RN and Post -op Vital signs reviewed and stable  Post vital signs: Reviewed  Last Vitals:  Vitals:   07/02/17 0918 07/02/17 1058  BP: (!) 163/74 (!) 114/47  Pulse: 92 65  Resp: 20 14  Temp: (!) 36.1 C (!) 36.2 C  SpO2: 97% 99%    Last Pain:  Vitals:   07/02/17 0918  TempSrc: Tympanic         Complications: No apparent anesthesia complications

## 2017-07-02 NOTE — Op Note (Signed)
[facility] Gastroenterology Patient Name: Timothy Bass Procedure Date: 07/02/2017 9:23 AM MRN: [id number] Account #: [id number] Date of Birth: 1932/12/08 Admit Type: Outpatient Age: 84 Room: [facility] ENDO ROOM 1 Gender: Male Note Status: Finalized Procedure:            Colonoscopy Indications:          Constipation, Weight loss Providers:            Lollie Sails, MD Referring MD:         Youlanda Roys. Lovie Macadamia, MD (Referring MD) Medicines:            Monitored Anesthesia Care Complications:        No immediate complications. Procedure:            Pre-Anesthesia Assessment:                       - ASA Grade Assessment: IV - A patient with severe                        systemic disease that is a constant threat to life.                       After obtaining informed consent, the colonoscope was                        passed under direct vision. Throughout the procedure,                        the patient's blood pressure, pulse, and oxygen                        saturations were monitored continuously. The                        Colonoscope was introduced through the anus and                        advanced to the the cecum, identified by appendiceal                        orifice and ileocecal valve. The colonoscopy was                        unusually difficult due to a tortuous colon. Successful                        completion of the procedure was aided by changing the                        patient to a supine position, changing the patient to a                        prone position and using manual pressure. The patient                        tolerated the procedure well. The quality of the bowel                        preparation was good. Findings:      Multiple  small-mouthed diverticula were found in the sigmoid colon and       descending colon.      A 4 mm polyp was found in the descending colon. The polyp was sessile.       The polyp was removed with a cold  snare. Resection and retrieval were       complete.      A 27 mm polyp was found in the descending colon. The polyp was       pedunculated. The polyp was removed with a hot snare. Resection and       retrieval were complete. To prevent bleeding after the polypectomy, two       hemostatic clips were successfully placed (MR conditional). There was no       bleeding at the end of the maneuver.      A 7 mm polyp was found in the mid sigmoid colon. The polyp was       pedunculated. The polyp was removed with a hot snare. Resection and       retrieval were complete. To prevent bleeding after the polypectomy, two       hemostatic clips were successfully placed (MR conditional). There was no       bleeding at the end of the maneuver.      Non-bleeding internal hemorrhoids were found during retroflexion and       during anoscopy. The hemorrhoids were small and Grade I (internal       hemorrhoids that do not prolapse).      No additional abnormalities were found on retroflexion.      The digital rectal exam was normal. Impression:           - Diverticulosis in the sigmoid colon and in the                        descending colon.                       - One 4 mm polyp in the descending colon, removed with                        a cold snare. Resected and retrieved.                       - One 27 mm polyp in the descending colon, removed with                        a hot snare. Resected and retrieved. Clips (MR                        conditional) were placed.                       - One 7 mm polyp in the mid sigmoid colon, removed with                        a hot snare. Resected and retrieved. Clips (MR                        conditional) were placed.                       - Non-bleeding internal hemorrhoids.  Recommendation:       - Discharge patient to home.                       - Await pathology results.                       - Return to GI clinic in 4 weeks.                       - Miralax 1  capful (17 grams) in 8 ounces of water PO                        daily daily. Procedure Code(s):    --- Professional ---                       [phone], Colonoscopy, flexible; with removal of tumor(s),                        polyp(s), or other lesion(s) by snare technique Diagnosis Code(s):    --- Professional ---                       K64.0, First degree hemorrhoids                       D12.4, Benign neoplasm of descending colon                       D12.5, Benign neoplasm of sigmoid colon                       K59.00, Constipation, unspecified                       R63.4, Abnormal weight loss                       K57.30, Diverticulosis of large intestine without                        perforation or abscess without bleeding CPT copyright 2016 American Medical Association. All rights reserved. The codes documented in this report are preliminary and upon coder review may  be revised to meet current compliance requirements. Lollie Sails, MD 07/02/2017 10:59:53 AM This report has been signed electronically. Number of Addenda: 0 Note Initiated On: 07/02/2017 9:23 AM Scope Withdrawal Time: 0 hours 36 minutes 53 seconds  Total Procedure Duration: 1 hour 1 minute 41 seconds       [facility]

## 2017-07-03 ENCOUNTER — Encounter: Payer: Self-pay | Admitting: Gastroenterology

## 2017-07-03 LAB — SURGICAL PATHOLOGY

## 2017-07-03 NOTE — Anesthesia Postprocedure Evaluation (Signed)
Anesthesia Post Note  Patient: Timothy Bass  Procedure(s) Performed: COLONOSCOPY WITH PROPOFOL (N/A )  Patient location during evaluation: PACU Anesthesia Type: General Level of consciousness: awake and alert Pain management: pain level controlled Vital Signs Assessment: post-procedure vital signs reviewed and stable Respiratory status: spontaneous breathing, nonlabored ventilation, respiratory function stable and patient connected to nasal cannula oxygen Cardiovascular status: blood pressure returned to baseline and stable Postop Assessment: no apparent nausea or vomiting Anesthetic complications: no     Last Vitals:  Vitals:   07/02/17 1116 07/02/17 1126  BP: (!) 146/68 (!) 114/92  Pulse: 74 73  Resp: 17 18  Temp:    SpO2: 100% 98%    Last Pain:  Vitals:   07/03/17 0813  TempSrc:   PainSc: 0-No pain                 Molli Barrows

## 2017-07-10 DIAGNOSIS — K5900 Constipation, unspecified: Secondary | ICD-10-CM | POA: Diagnosis not present

## 2017-07-31 ENCOUNTER — Ambulatory Visit: Payer: Medicare Other | Admitting: Urology

## 2017-07-31 NOTE — Progress Notes (Signed)
08/01/2017 1:38 PM   Timothy Bass 08/05/1932 [id number]  Referring provider: Juluis Pitch, MD [name], [place] [address]  Chief Complaint  Patient presents with  . Follow-up    HPI: 84 yo AM with BPH and LUTS who returns today for routine follow up.    His IPSS score today is 19, which is moderate lower urinary tract symptomatology.  He is mostly dissatisfied with his quality life due to his urinary symptoms. His PVR is 185 mL.  His previous I PSS score was 14/2.  His previous PVR was 54 mL.    His major complaint today is increased urinary frequency in the afternoon.  He denies any dysuria, hematuria or suprapubic pain.   He currently taking finasteride 5 mg, terazosin 2 mg and oxybutynin 5 mg daily.       He also denies any recent fevers, chills, nausea or vomiting.  He is having some itching in his groin area.   IPSS    Row Name 08/01/17 1300         International Prostate Symptom Score   How often have you had the sensation of not emptying your bladder?  Less than half the time     How often have you had to urinate less than every two hours?  About half the time     How often have you found you stopped and started again several times when you urinated?  About half the time     How often have you found it difficult to postpone urination?  About half the time     How often have you had a weak urinary stream?  About half the time     How often have you had to strain to start urination?  Less than half the time     How many times did you typically get up at night to urinate?  3 Times     Total IPSS Score  19       Quality of Life due to urinary symptoms   If you were to spend the rest of your life with your urinary condition just the way it is now how would you feel about that?  Mostly Disatisfied        Score:  1-7 Mild 8-19 Moderate 20-35 Severe   PMH: Past Medical History:  Diagnosis Date  . BPH without obstruction/lower urinary tract  symptoms   . Coronary artery disease   . Dysuria   . Enlarged prostate   . GERD (gastroesophageal reflux disease)   . Hypercholesteremia   . Hyperglycemia   . Hyperlipemia   . Hypertension   . Myocardial infarction ([place])   . Nocturia   . Prostatic hypertrophy   . Urinary frequency   . Urinary incontinence     Surgical History: Past Surgical History:  Procedure Laterality Date  . CARDIAC CATHETERIZATION N/A 01/05/2016   Procedure: LEFT HEART CATH AND CORS/GRAFTS ANGIOGRAPHY;  Surgeon: Timothy Skains, MD;  Location: [place] [facility];  Service: Cardiovascular;  Laterality: N/A;  . CARDIAC CATHETERIZATION N/A 01/05/2016   Procedure: Coronary Stent Intervention;  Surgeon: Timothy Cowman, MD;  Location: [place] [facility];  Service: Cardiovascular;  Laterality: N/A;  . cardiac stents  2017  . COLONOSCOPY WITH PROPOFOL N/A 07/02/2017   Procedure: COLONOSCOPY WITH PROPOFOL;  Surgeon: Timothy Sails, MD;  Location: [facility] ENDOSCOPY;  Service: Endoscopy;  Laterality: N/A;  . CORONARY ARTERY BYPASS GRAFT  x2  . ESOPHAGOGASTRODUODENOSCOPY (EGD) WITH PROPOFOL N/A 05/27/2015   Procedure: ESOPHAGOGASTRODUODENOSCOPY (EGD) WITH PROPOFOL;  Surgeon: Timothy Luster, MD;  Location: [facility] ENDOSCOPY;  Service: Gastroenterology;  Laterality: N/A;  . VASECTOMY      Home Medications:  Allergies as of 08/01/2017      Reactions   Aspirin    Pt.states aspirin gives pt. high blood pressure   Peanuts [peanut Oil]       Medication List        Accurate as of 08/01/17  1:38 PM. Always use your most recent med list.          amLODipine-benazepril 5-10 MG capsule Commonly known as:  LOTREL   atorvastatin 80 MG tablet Commonly known as:  LIPITOR Take 1 tablet (80 mg total) by mouth daily.   clopidogrel 75 MG tablet Commonly known as:  PLAVIX Take 1 tablet (75 mg total) by mouth daily with breakfast.   finasteride 5 MG tablet Commonly known as:  PROSCAR Take 1 tablet (5 mg total) by  mouth daily.   gemfibrozil 600 MG tablet Commonly known as:  LOPID Take 600 mg by mouth 2 (two) times daily before a meal.   glucosamine-chondroitin 500-400 MG tablet Take 1 tablet by mouth 3 (three) times daily.   ipratropium 0.03 % nasal spray Commonly known as:  ATROVENT Place 2 sprays into both nostrils every 12 (twelve) hours.   isosorbide mononitrate 30 MG 24 hr tablet Commonly known as:  IMDUR Take 1 tablet (30 mg total) by mouth daily.   mometasone 0.1 % lotion Commonly known as:  ELOCON INSTILL 4 DROPS TO EACH EAR 1 TO 2 TIMES PER WEEK AS NEEDED FOR DRY SKIN AND ITCHING   MULTI-VITAMINS Tabs Take 1 tablet by mouth daily.   nystatin cream Commonly known as:  MYCOSTATIN Apply 1 application topically 2 (two) times daily.   omega-3 acid ethyl esters 1 g capsule Commonly known as:  LOVAZA Take by mouth 2 (two) times daily.   oxybutynin 10 MG 24 hr tablet Commonly known as:  DITROPAN-XL Take 1 tablet (10 mg total) by mouth at bedtime.   oxyCODONE-acetaminophen 5-325 MG tablet Commonly known as:  ROXICET Take 1 tablet by mouth every 6 (six) hours as needed.   solifenacin 5 MG tablet Commonly known as:  VESICARE Take 1 tablet (5 mg total) by mouth daily.   terazosin 2 MG capsule Commonly known as:  HYTRIN Take 1 capsule (2 mg total) by mouth at bedtime.       Allergies:  Allergies  Allergen Reactions  . Aspirin     Pt.states aspirin gives pt. high blood pressure  . Peanuts [Peanut Oil]     Family History: Family History  Family history unknown: Yes    Social History:  reports that he quit smoking about 24 years ago. His smoking use included cigarettes. He has a 30.00 pack-year smoking history. he has never used smokeless tobacco. He reports that he does not drink alcohol or use drugs.  ROS: UROLOGY Frequent Urination?: No Hard to postpone urination?: No Burning/pain with urination?: No Get up at night to urinate?: No Leakage of urine?:  No Urine stream starts and stops?: No Trouble starting stream?: No Do you have to strain to urinate?: No Blood in urine?: No Urinary tract infection?: No Sexually transmitted disease?: No Injury to kidneys or bladder?: No Painful intercourse?: No Weak stream?: No Erection problems?: No Penile pain?: No  Gastrointestinal Nausea?: No Vomiting?: No Indigestion/heartburn?: No Diarrhea?:  No Constipation?: No  Constitutional Fever: No Night sweats?: No Weight loss?: No Fatigue?: No  Skin Skin rash/lesions?: No Itching?: No  Eyes Blurred vision?: No Double vision?: No  Ears/Nose/Throat Sore throat?: No Sinus problems?: No  Hematologic/Lymphatic Swollen glands?: No Easy bruising?: No  Cardiovascular Leg swelling?: No Chest pain?: No  Respiratory Cough?: No Shortness of breath?: No  Endocrine Excessive thirst?: No  Musculoskeletal Back pain?: No Joint pain?: No  Neurological Headaches?: No Dizziness?: No  Psychologic Depression?: No Anxiety?: No  Physical Exam: BP 125/71   Pulse 84   Ht 5\' 6"  (1.676 m)   Wt 181 lb (82.1 kg)   BMI 29.21 kg/m   Constitutional: Well nourished. Alert and oriented, No acute distress. HEENT: [place] AT, moist mucus membranes. Trachea midline, no masses. Cardiovascular: No clubbing, cyanosis, or edema. Respiratory: Normal respiratory effort, no increased work of breathing. GI: Abdomen is soft, non tender, non distended, no abdominal masses. Liver and spleen not palpable.  No hernias appreciated.  Stool sample for occult testing is not indicated.   GU: No CVA tenderness.  No bladder fullness or masses.  Patient with uncircumcised phallus.  Foreskin easily retracted   Urethral meatus is patent.  No penile discharge. No penile lesions or rashes. Scrotum without lesions, cysts, rashes and/or edema.  Testicles are located scrotally bilaterally. No masses are appreciated in the testicles. Left and right epididymis are normal.   Tinea cruris is noted in the groin.  Rectal: Patient with  normal sphincter tone. Anus and perineum without scarring or rashes. No rectal masses are appreciated. Prostate is approximately 45 grams, no nodules are appreciated. Seminal vesicles are normal. Skin: No rashes, bruises or suspicious lesions. Lymph: No cervical or inguinal adenopathy. Neurologic: Grossly intact, no focal deficits, moving all 4 extremities. Psychiatric: Normal mood and affect.  Laboratory Data: Lab Results  Component Value Date   WBC 5.3 02/17/2017   HGB 14.9 02/17/2017   HCT 42.5 02/17/2017   MCV 95.0 02/17/2017   PLT 165 02/17/2017    Lab Results  Component Value Date   CREATININE 0.81 02/17/2017    No results found for: PSA  No results found for: TESTOSTERONE  No results found for: HGBA1C  No results found for: TSH  No results found for: CHOL, HDL, CHOLHDL, VLDL, LDLCALC  Lab Results  Component Value Date   AST 29 02/17/2017   Lab Results  Component Value Date   ALT 25 02/17/2017   No components found for: ALKALINEPHOPHATASE No components found for: BILIRUBINTOTAL  No results found for: ESTRADIOL  Urinalysis    Component Value Date/Time   APPEARANCEUR Clear 11/26/2014 0924   GLUCOSEU Negative 11/26/2014 0924   BILIRUBINUR Negative 11/26/2014 0924   PROTEINUR Negative 11/26/2014 0924   NITRITE Negative 11/26/2014 0924   LEUKOCYTESUR Negative 11/26/2014 0924    I have reviewed the labs.   Pertinent Imaging: Results for TAELOR, MONCADA (MRN [id number]) as of 08/01/2017 13:43  Ref. Range 08/01/2017 00:00  Scan Result Unknown [id number]     Assessment & Plan:    1. BPH with LUTS             - IPSS score is 19/4, it is worsening             - Continue conservative management, avoiding bladder irritants and timed voiding's             - Continue finasteride 5 mg daily and terazosin 2 mg daily; refills given  - will  switch the oxybutynin IR 5 mg daily to oxybutynin XR 10 mg daily              - RTC in 12 months for I PSS, PVR and exam   2. Urinary frequency Take the oxybutynin XL 10 mg in the afternoon to see if that will aid in afternoon frequency - BLADDER SCAN AMB NON-IMAGING  3. History of incomplete bladder emptying History of previously elevated PVRs.  PVR today acceptable on oxybutynin  4.  Tinea cruris Prescribed nystatin cream twice daily    Return in about 1 year (around 08/02/2018) for IPSS and PVR.  These notes generated with voice recognition software. I apologize for typographical errors.  Zara Council, [facility] [address] [place],  [address] [phone]

## 2017-08-01 ENCOUNTER — Encounter: Payer: Self-pay | Admitting: Urology

## 2017-08-01 ENCOUNTER — Ambulatory Visit: Payer: Medicare HMO | Admitting: Urology

## 2017-08-01 VITALS — BP 125/71 | HR 84 | Ht 66.0 in | Wt 181.0 lb

## 2017-08-01 DIAGNOSIS — Z87898 Personal history of other specified conditions: Secondary | ICD-10-CM

## 2017-08-01 DIAGNOSIS — N401 Enlarged prostate with lower urinary tract symptoms: Secondary | ICD-10-CM | POA: Diagnosis not present

## 2017-08-01 DIAGNOSIS — B356 Tinea cruris: Secondary | ICD-10-CM

## 2017-08-01 DIAGNOSIS — N138 Other obstructive and reflux uropathy: Secondary | ICD-10-CM

## 2017-08-01 DIAGNOSIS — R350 Frequency of micturition: Secondary | ICD-10-CM

## 2017-08-01 LAB — BLADDER SCAN AMB NON-IMAGING: SCAN RESULT: 185

## 2017-08-01 MED ORDER — NYSTATIN 100000 UNIT/GM EX CREA: 1.0000 "application " | TOPICAL_CREAM | Freq: Two times a day (BID) | CUTANEOUS | 0 refills | Status: DC

## 2017-08-01 MED ORDER — TERAZOSIN HCL 2 MG PO CAPS: 2.0000 mg | ORAL_CAPSULE | Freq: Every day | ORAL | 3 refills | Status: DC

## 2017-08-01 MED ORDER — OXYBUTYNIN CHLORIDE ER 10 MG PO TB24: 10.0000 mg | ORAL_TABLET | Freq: Every day | ORAL | 3 refills | Status: DC

## 2017-08-01 MED ORDER — FINASTERIDE 5 MG PO TABS: 5.0000 mg | ORAL_TABLET | Freq: Every day | ORAL | 3 refills | Status: DC

## 2017-08-23 DIAGNOSIS — D369 Benign neoplasm, unspecified site: Secondary | ICD-10-CM | POA: Diagnosis not present

## 2017-08-23 DIAGNOSIS — K5909 Other constipation: Secondary | ICD-10-CM | POA: Diagnosis not present

## 2017-10-16 DIAGNOSIS — I25810 Atherosclerosis of coronary artery bypass graft(s) without angina pectoris: Secondary | ICD-10-CM | POA: Diagnosis not present

## 2017-10-16 DIAGNOSIS — E7801 Familial hypercholesterolemia: Secondary | ICD-10-CM | POA: Diagnosis not present

## 2017-10-16 DIAGNOSIS — I10 Essential (primary) hypertension: Secondary | ICD-10-CM | POA: Diagnosis not present

## 2017-12-12 DIAGNOSIS — R739 Hyperglycemia, unspecified: Secondary | ICD-10-CM | POA: Diagnosis not present

## 2017-12-12 DIAGNOSIS — E7801 Familial hypercholesterolemia: Secondary | ICD-10-CM | POA: Diagnosis not present

## 2017-12-12 DIAGNOSIS — I10 Essential (primary) hypertension: Secondary | ICD-10-CM | POA: Diagnosis not present

## 2017-12-12 DIAGNOSIS — K5900 Constipation, unspecified: Secondary | ICD-10-CM | POA: Diagnosis not present

## 2018-01-08 DIAGNOSIS — N390 Urinary tract infection, site not specified: Secondary | ICD-10-CM | POA: Diagnosis not present

## 2018-01-08 DIAGNOSIS — R399 Unspecified symptoms and signs involving the genitourinary system: Secondary | ICD-10-CM | POA: Diagnosis not present

## 2018-01-14 DIAGNOSIS — M5441 Lumbago with sciatica, right side: Secondary | ICD-10-CM | POA: Diagnosis not present

## 2018-01-14 DIAGNOSIS — K5900 Constipation, unspecified: Secondary | ICD-10-CM | POA: Diagnosis not present

## 2018-01-14 DIAGNOSIS — G8929 Other chronic pain: Secondary | ICD-10-CM | POA: Diagnosis not present

## 2018-01-29 DIAGNOSIS — Z23 Encounter for immunization: Secondary | ICD-10-CM | POA: Diagnosis not present

## 2018-03-13 ENCOUNTER — Ambulatory Visit (INDEPENDENT_AMBULATORY_CARE_PROVIDER_SITE_OTHER): Payer: Medicare HMO | Admitting: Urology

## 2018-03-13 ENCOUNTER — Encounter: Payer: Self-pay | Admitting: Urology

## 2018-03-13 VITALS — BP 100/56 | HR 81 | Wt 171.4 lb

## 2018-03-13 DIAGNOSIS — N401 Enlarged prostate with lower urinary tract symptoms: Secondary | ICD-10-CM

## 2018-03-13 DIAGNOSIS — B356 Tinea cruris: Secondary | ICD-10-CM | POA: Diagnosis not present

## 2018-03-13 DIAGNOSIS — N138 Other obstructive and reflux uropathy: Secondary | ICD-10-CM

## 2018-03-13 DIAGNOSIS — Z87898 Personal history of other specified conditions: Secondary | ICD-10-CM

## 2018-03-13 DIAGNOSIS — R351 Nocturia: Secondary | ICD-10-CM

## 2018-03-13 LAB — BLADDER SCAN AMB NON-IMAGING: Scan Result: 82

## 2018-03-13 MED ORDER — NYSTATIN 100000 UNIT/GM EX CREA: 1.0000 "application " | TOPICAL_CREAM | Freq: Two times a day (BID) | CUTANEOUS | 0 refills | Status: DC

## 2018-03-13 NOTE — Progress Notes (Signed)
03/13/2018 3:29 PM   Timothy Bass [birth year] [id number]  Referring provider: Juluis Pitch, MD [phone] [place], [place] [address]  Chief Complaint  Patient presents with  . Urinary Frequency    HPI: Patient is an 84 year old [place] male who presents today with a complaint of excessive urination at night.  He was last seen in March of 2019 in our office for his annual visit.  He is taking finasteride 5 mg, terazosin 2 mg and oxybutynin 10 mg daily.    He is complaining of nocturia that is occurring 3 to 4 times a night.  He is frustrated as he cannot sleep.  This has been going on for awhile.  Patient denies any gross hematuria, dysuria or suprapubic/flank pain.  Patient denies any fevers, chills, nausea or vomiting.     His PVR is 82 mL.  His UA is bland.    He also states that the itching in the groin area is not relieved with the nystatin cream    PMH: Past Medical History:  Diagnosis Date  . BPH without obstruction/lower urinary tract symptoms   . Coronary artery disease   . Dysuria   . Enlarged prostate   . GERD (gastroesophageal reflux disease)   . Hypercholesteremia   . Hyperglycemia   . Hyperlipemia   . Hypertension   . Myocardial infarction ([place])   . Nocturia   . Prostatic hypertrophy   . Urinary frequency   . Urinary incontinence     Surgical History: Past Surgical History:  Procedure Laterality Date  . CARDIAC CATHETERIZATION N/A 01/05/2016   Procedure: LEFT HEART CATH AND CORS/GRAFTS ANGIOGRAPHY;  Surgeon: Corey Skains, MD;  Location: [place] [facility];  Service: Cardiovascular;  Laterality: N/A;  . CARDIAC CATHETERIZATION N/A 01/05/2016   Procedure: Coronary Stent Intervention;  Surgeon: Isaias Cowman, MD;  Location: [place] [facility];  Service: Cardiovascular;  Laterality: N/A;  . cardiac stents  2017  . COLONOSCOPY WITH PROPOFOL N/A 07/02/2017   Procedure: COLONOSCOPY WITH PROPOFOL;  Surgeon: Lollie Sails, MD;   Location: [facility] ENDOSCOPY;  Service: Endoscopy;  Laterality: N/A;  . CORONARY ARTERY BYPASS GRAFT     x2  . ESOPHAGOGASTRODUODENOSCOPY (EGD) WITH PROPOFOL N/A 05/27/2015   Procedure: ESOPHAGOGASTRODUODENOSCOPY (EGD) WITH PROPOFOL;  Surgeon: Hulen Luster, MD;  Location: [facility] ENDOSCOPY;  Service: Gastroenterology;  Laterality: N/A;  . VASECTOMY      Home Medications:  Allergies as of 03/13/2018      Reactions   Peanuts [peanut Oil]       Medication List        Accurate as of 03/13/18  3:29 PM. Always use your most recent med list.          amLODipine-benazepril 5-10 MG capsule Commonly known as:  LOTREL   ASPIRIN ADULT LOW DOSE PO Take 80 mg by mouth daily.   atorvastatin 80 MG tablet Commonly known as:  LIPITOR Take 1 tablet (80 mg total) by mouth daily.   clopidogrel 75 MG tablet Commonly known as:  PLAVIX Take 1 tablet (75 mg total) by mouth daily with breakfast.   finasteride 5 MG tablet Commonly known as:  PROSCAR Take 1 tablet (5 mg total) by mouth daily.   gemfibrozil 600 MG tablet Commonly known as:  LOPID Take 600 mg by mouth 2 (two) times daily before a meal.   glucosamine-chondroitin 500-400 MG tablet Take 1 tablet by mouth 3 (three) times daily.   ipratropium 0.03 % nasal  spray Commonly known as:  ATROVENT Place 2 sprays into both nostrils every 12 (twelve) hours.   isosorbide mononitrate 30 MG 24 hr tablet Commonly known as:  IMDUR Take 1 tablet (30 mg total) by mouth daily.   mometasone 0.1 % lotion Commonly known as:  ELOCON INSTILL 4 DROPS TO EACH EAR 1 TO 2 TIMES PER WEEK AS NEEDED FOR DRY SKIN AND ITCHING   MULTI-VITAMINS Tabs Take 1 tablet by mouth daily.   nystatin cream Commonly known as:  MYCOSTATIN Apply 1 application topically 2 (two) times daily.   omega-3 acid ethyl esters 1 g capsule Commonly known as:  LOVAZA Take by mouth 2 (two) times daily.   oxybutynin 10 MG 24 hr tablet Commonly known as:  DITROPAN-XL Take 1 tablet (10  mg total) by mouth at bedtime.   oxyCODONE-acetaminophen 5-325 MG tablet Commonly known as:  PERCOCET/ROXICET Take 1 tablet by mouth every 6 (six) hours as needed.   solifenacin 5 MG tablet Commonly known as:  VESICARE Take 1 tablet (5 mg total) by mouth daily.   terazosin 2 MG capsule Commonly known as:  HYTRIN Take 1 capsule (2 mg total) by mouth at bedtime.       Allergies:  Allergies  Allergen Reactions  . Peanuts [Peanut Oil]     Family History: Family History  Family history unknown: Yes    Social History:  reports that he quit smoking about 24 years ago. His smoking use included cigarettes. He has a 30.00 pack-year smoking history. He has never used smokeless tobacco. He reports that he does not drink alcohol or use drugs.  ROS: UROLOGY Frequent Urination?: No Hard to postpone urination?: No Burning/pain with urination?: No Get up at night to urinate?: Yes Leakage of urine?: Yes Urine stream starts and stops?: No Trouble starting stream?: No Do you have to strain to urinate?: No Blood in urine?: No Urinary tract infection?: No Sexually transmitted disease?: No Injury to kidneys or bladder?: No Painful intercourse?: No Weak stream?: No Erection problems?: No Penile pain?: No  Gastrointestinal Nausea?: No Vomiting?: No Indigestion/heartburn?: No Diarrhea?: No Constipation?: Yes  Constitutional Fever: No Night sweats?: No Weight loss?: No Fatigue?: No  Skin Skin rash/lesions?: No Itching?: No  Eyes Blurred vision?: No Double vision?: No  Ears/Nose/Throat Sore throat?: No Sinus problems?: No  Hematologic/Lymphatic Swollen glands?: No Easy bruising?: No  Cardiovascular Leg swelling?: No Chest pain?: No  Respiratory Cough?: No Shortness of breath?: No  Endocrine Excessive thirst?: No  Musculoskeletal Back pain?: No Joint pain?: No  Neurological Headaches?: No Dizziness?: No  Psychologic Depression?: No Anxiety?:  No  Physical Exam: BP (!) 100/56 (BP Location: Left Arm, Patient Position: Sitting, Cuff Size: Normal)   Pulse 81   Wt 171 lb 6.4 oz (77.7 kg)   BMI 27.66 kg/m   Constitutional: Well nourished. Alert and oriented, No acute distress. HEENT: [place] AT, moist mucus membranes. Trachea midline, no masses. Cardiovascular: No clubbing, cyanosis, or edema. Respiratory: Normal respiratory effort, no increased work of breathing. Skin: No rashes, bruises or suspicious lesions. Lymph: No cervical or inguinal adenopathy. Neurologic: Grossly intact, no focal deficits, moving all 4 extremities. Psychiatric: Normal mood and affect.  Laboratory Data: Lab Results  Component Value Date   WBC 5.3 02/17/2017   HGB 14.9 02/17/2017   HCT 42.5 02/17/2017   MCV 95.0 02/17/2017   PLT 165 02/17/2017    Lab Results  Component Value Date   CREATININE 0.81 02/17/2017    No results  found for: PSA  No results found for: TESTOSTERONE  No results found for: HGBA1C  No results found for: TSH  No results found for: CHOL, HDL, CHOLHDL, VLDL, LDLCALC  Lab Results  Component Value Date   AST 29 02/17/2017   Lab Results  Component Value Date   ALT 25 02/17/2017   No components found for: ALKALINEPHOPHATASE No components found for: BILIRUBINTOTAL  No results found for: ESTRADIOL  Urinalysis Negative.  See Epic.  I have reviewed the labs.   Pertinent Imaging: Results for SYLAR, VOONG (MRN [id number]) as of 03/13/2018 15:19  Ref. Range 03/13/2018 14:26  Scan Result Unknown 82      Assessment & Plan:    1. BPH with LUTS  Continue conservative management, avoiding bladder irritants and timed voiding's Most bothersome symptom is nocturia  Continue finasteride 5 mg daily, oxybutynin XL 10 mg and terazosin 2 mg daily Discussed cystoscopy and patient deferred Keep March appointment  2. Nocturia  - I explained to the patient that nocturia is often multi-factorial and difficult to treat.   Sleeping disorders, heart conditions, peripheral vascular disease, diabetes, an enlarged prostate for men, an urethral stricture causing bladder outlet obstruction and/or certain medications can contribute to nocturia.  - I have suggested that the patient avoid caffeine after noon and alcohol in the evening.  He or she may also benefit from fluid restrictions after 6:00 in the evening and voiding just prior to bedtime.  - I have explained that research studies have showed that over 84% of patients with sleep apnea reported frequent nighttime urination.   With sleep apnea, oxygen decreases, carbon dioxide increases, the blood become more acidic, the heart rate drops and blood vessels in the lung constrict.  The body is then alerted that something is very wrong. The sleeper must wake enough to reopen the airway. By this time, the heart is racing and experiences a false signal of fluid overload. The heart excretes a hormone-like protein that tells the body to get rid of sodium and water, resulting in nocturia.  -  I also informed the patient that a recent study noted that decreasing sodium intake to 2.3 grams daily, if they don't have issues with hyponatremia, can also reduce the number of nightly voids  - The patient may benefit from a discussion with his or her primary care physician to see if he or she has risk factors for sleep apnea or other sleep disturbances and obtaining a sleep study. - BLADDER SCAN AMB NON-IMAGING  3. History of incomplete bladder emptying History of previously elevated PVRs.  PVR today acceptable on oxybutynin  4.  Tinea cruris Prescribed nystatin cream twice daily Offered referral to dermatology but patient would like to do this on his own Gave him the numbers to local dermatology and encouraged him to make an appointment    Return for keep March appointment .  These notes generated with voice recognition software. I apologize for typographical errors.  Zara Council, PA-C  [facility] [address] [place], [place] [address] [phone]

## 2018-03-14 LAB — MICROSCOPIC EXAMINATION: RBC, UA: NONE SEEN /hpf (ref 0–2)

## 2018-03-14 LAB — URINALYSIS, COMPLETE
Bilirubin, UA: NEGATIVE
GLUCOSE, UA: NEGATIVE
Ketones, UA: NEGATIVE
Nitrite, UA: NEGATIVE
PH UA: 6.5 (ref 5.0–7.5)
PROTEIN UA: NEGATIVE
Specific Gravity, UA: 1.015 (ref 1.005–1.030)
UUROB: 0.2 mg/dL (ref 0.2–1.0)

## 2018-03-26 ENCOUNTER — Other Ambulatory Visit: Payer: Self-pay | Admitting: Family Medicine

## 2018-03-26 MED ORDER — OXYBUTYNIN CHLORIDE ER 10 MG PO TB24: 10.0000 mg | ORAL_TABLET | Freq: Every day | ORAL | 1 refills | Status: DC

## 2018-04-17 DIAGNOSIS — K5900 Constipation, unspecified: Secondary | ICD-10-CM | POA: Diagnosis not present

## 2018-04-17 DIAGNOSIS — M545 Low back pain: Secondary | ICD-10-CM | POA: Diagnosis not present

## 2018-05-17 DIAGNOSIS — J309 Allergic rhinitis, unspecified: Secondary | ICD-10-CM | POA: Diagnosis not present

## 2018-05-17 DIAGNOSIS — L299 Pruritus, unspecified: Secondary | ICD-10-CM | POA: Diagnosis not present
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
## 2018-07-01 ENCOUNTER — Other Ambulatory Visit: Payer: Self-pay | Admitting: Urology

## 2018-07-04 DIAGNOSIS — I25810 Atherosclerosis of coronary artery bypass graft(s) without angina pectoris: Secondary | ICD-10-CM | POA: Diagnosis not present

## 2018-07-04 DIAGNOSIS — I10 Essential (primary) hypertension: Secondary | ICD-10-CM | POA: Diagnosis not present

## 2018-07-04 DIAGNOSIS — E7801 Familial hypercholesterolemia: Secondary | ICD-10-CM | POA: Diagnosis not present

## 2018-07-29 DIAGNOSIS — Z0000 Encounter for general adult medical examination without abnormal findings: Secondary | ICD-10-CM | POA: Diagnosis not present

## 2018-07-29 DIAGNOSIS — N400 Enlarged prostate without lower urinary tract symptoms: Secondary | ICD-10-CM | POA: Diagnosis not present

## 2018-07-29 DIAGNOSIS — R739 Hyperglycemia, unspecified: Secondary | ICD-10-CM | POA: Diagnosis not present

## 2018-07-29 DIAGNOSIS — I10 Essential (primary) hypertension: Secondary | ICD-10-CM | POA: Diagnosis not present

## 2018-07-29 DIAGNOSIS — E7801 Familial hypercholesterolemia: Secondary | ICD-10-CM | POA: Diagnosis not present

## 2018-07-29 DIAGNOSIS — I25810 Atherosclerosis of coronary artery bypass graft(s) without angina pectoris: Secondary | ICD-10-CM | POA: Diagnosis not present

## 2018-07-31 ENCOUNTER — Ambulatory Visit: Payer: Medicare HMO | Admitting: Urology

## 2018-10-01 ENCOUNTER — Ambulatory Visit: Payer: Medicare HMO | Admitting: Urology

## 2018-10-17 ENCOUNTER — Other Ambulatory Visit: Payer: Self-pay | Admitting: *Deleted

## 2018-10-17 DIAGNOSIS — N401 Enlarged prostate with lower urinary tract symptoms: Secondary | ICD-10-CM

## 2018-10-17 MED ORDER — FINASTERIDE 5 MG PO TABS: 5.0000 mg | ORAL_TABLET | Freq: Every day | ORAL | 3 refills | Status: DC

## 2018-10-23 NOTE — Progress Notes (Signed)
10/24/2018 10:23 AM   Rockwell [place] 08/15/1932 [id number]  Referring provider: Juluis Pitch, MD [phone] [place], [place] [address]  Chief Complaint  Patient presents with   Nocturia    HPI: Patient is an 85 year old [place] male with BPH with LU TS, nocturia, tinea cruris and incomplete bladder emptying who presents today for follow up.    BPH WITH LUTS  (prostate and/or bladder) IPSS score: 17/3    PVR: 112 mL   Previous score: 19/4  Previous PVR: 82 mL   Major complaint(s):  Nocturia and urgency x  one year. Denies any dysuria, hematuria or suprapubic pain.   Currently taking: oxybutynin 10 mg daily, terazosin 2 mg daily and finasteride 5 mg daily    Denies any recent fevers, chills, nausea or vomiting.   IPSS    Row Name 10/24/18 1000         International Prostate Symptom Score   How often have you had the sensation of not emptying your bladder?  About half the time     How often have you had to urinate less than every two hours?  About half the time     How often have you found you stopped and started again several times when you urinated?  More than half the time     How often have you found it difficult to postpone urination?  Less than half the time     How often have you had a weak urinary stream?  About half the time     How often have you had to strain to start urination?  Less than half the time     Total IPSS Score  17       Quality of Life due to urinary symptoms   If you were to spend the rest of your life with your urinary condition just the way it is now how would you feel about that?  Mixed        Score:  1-7 Mild 8-19 Moderate 20-35 Severe   PMH: Past Medical History:  Diagnosis Date   BPH without obstruction/lower urinary tract symptoms    Coronary artery disease    Dysuria    Enlarged prostate    GERD (gastroesophageal reflux disease)    Hypercholesteremia    Hyperglycemia    Hyperlipemia    Hypertension     Myocardial infarction [facility])    Nocturia    Prostatic hypertrophy    Urinary frequency    Urinary incontinence     Surgical History: Past Surgical History:  Procedure Laterality Date   CARDIAC CATHETERIZATION N/A 01/05/2016   Procedure: LEFT HEART CATH AND CORS/GRAFTS ANGIOGRAPHY;  Surgeon: Corey Skains, MD;  Location: [place] [facility];  Service: Cardiovascular;  Laterality: N/A;   CARDIAC CATHETERIZATION N/A 01/05/2016   Procedure: Coronary Stent Intervention;  Surgeon: Isaias Cowman, MD;  Location: [place] [facility];  Service: Cardiovascular;  Laterality: N/A;   cardiac stents  2017   COLONOSCOPY WITH PROPOFOL N/A 07/02/2017   Procedure: COLONOSCOPY WITH PROPOFOL;  Surgeon: Lollie Sails, MD;  [facility] END[facility]COPY;  Service: Endoscopy;  Laterality: N/A;   CORONARY ARTERY BYPASS GRAFT     x2   ESOPHAGOGASTRODUODENOSCOPY (EGD) WITH PROPOFOL N/A 05/27/2015   Procedure: ESOPHAGOGASTRODUODENOSCOPY (EGD) WITH PROPOFOL;  Surgeon: Hulen Luster, MD;  Location: [facility] ENDOSCOPY;  Service: Gastroenterology;  Laterality: N/A;   VASECTOMY      Home Medications:  Allergies as of 10/24/2018  Reactions   Peanuts [peanut Oil]       Medication List       Accurate as of October 24, 2018 10:23 AM. If you have any questions, ask your nurse or doctor.        amLODipine-benazepril 5-10 MG capsule Commonly known as:  LOTREL   ASPIRIN ADULT LOW DOSE PO Take 80 mg by mouth daily.   atorvastatin 80 MG tablet Commonly known as:  Lipitor Take 1 tablet (80 mg total) by mouth daily.   clopidogrel 75 MG tablet Commonly known as:  PLAVIX Take 1 tablet (75 mg total) by mouth daily with breakfast.   finasteride 5 MG tablet Commonly known as:  PROSCAR Take 1 tablet (5 mg total) by mouth daily.   gemfibrozil 600 MG tablet Commonly known as:  LOPID Take 600 mg by mouth 2 (two) times daily before a meal.   glucosamine-chondroitin 500-400 MG tablet Take 1 tablet by mouth  3 (three) times daily.   ipratropium 0.03 % nasal spray Commonly known as:  ATROVENT Place 2 sprays into both nostrils every 12 (twelve) hours.   isosorbide mononitrate 30 MG 24 hr tablet Commonly known as:  IMDUR Take 1 tablet (30 mg total) by mouth daily.   mometasone 0.1 % lotion Commonly known as:  ELOCON INSTILL 4 DROPS TO EACH EAR 1 TO 2 TIMES PER WEEK AS NEEDED FOR DRY SKIN AND ITCHING   Multi-Vitamins Tabs Take 1 tablet by mouth daily.   nystatin cream Commonly known as:  MYCOSTATIN Apply 1 application topically 2 (two) times daily.   omega-3 acid ethyl esters 1 g capsule Commonly known as:  LOVAZA Take by mouth 2 (two) times daily.   oxybutynin 10 MG 24 hr tablet Commonly known as:  DITROPAN-XL Take 1 tablet (10 mg total) by mouth at bedtime.   oxyCODONE-acetaminophen 5-325 MG tablet Commonly known as:  Roxicet Take 1 tablet by mouth every 6 (six) hours as needed.   solifenacin 5 MG tablet Commonly known as:  VESIcare Take 1 tablet (5 mg total) by mouth daily.   terazosin 2 MG capsule Commonly known as:  HYTRIN Take 1 capsule (2 mg total) by mouth at bedtime.       Allergies:  Allergies  Allergen Reactions   Peanuts [Peanut Oil]     Family History: Family History  Family history unknown: Yes    Social History:  reports that he quit smoking about 25 years ago. His smoking use included cigarettes. He has a 30.00 pack-year smoking history. He has never used smokeless tobacco. He reports that he does not drink alcohol or use drugs.  ROS: UROLOGY Frequent Urination?: No Hard to postpone urination?: No Burning/pain with urination?: No Get up at night to urinate?: Yes Leakage of urine?: No Urine stream starts and stops?: No Trouble starting stream?: No Do you have to strain to urinate?: No Blood in urine?: No Urinary tract infection?: No Sexually transmitted disease?: No Injury to kidneys or bladder?: No Painful intercourse?: No Weak stream?:  No Erection problems?: No Penile pain?: No  Gastrointestinal Nausea?: No Vomiting?: No Indigestion/heartburn?: No Diarrhea?: No Constipation?: No  Constitutional Fever: No Night sweats?: No Weight loss?: No Fatigue?: No  Skin Skin rash/lesions?: No Itching?: No  Eyes Blurred vision?: No Double vision?: No  Ears/Nose/Throat Sore throat?: No Sinus problems?: No  Hematologic/Lymphatic Swollen glands?: No Easy bruising?: No  Cardiovascular Leg swelling?: No Chest pain?: No  Respiratory Cough?: No Shortness of breath?: No  Endocrine Excessive  thirst?: No  Musculoskeletal Back pain?: No Joint pain?: No  Neurological Headaches?: No Dizziness?: No  Psychologic Depression?: No Anxiety?: No  Physical Exam: BP (!) 143/61 (BP Location: Left Arm, Patient Position: Sitting, Cuff Size: Normal)    Pulse 94    Ht 5\' 6"  (1.676 m)    Wt 172 lb 14.4 oz (78.4 kg)    BMI 27.91 kg/m   Constitutional:  Well nourished. Alert and oriented, No acute distress. HEENT: [place] AT, moist mucus membranes.  Trachea midline, no masses. Cardiovascular: No clubbing, cyanosis, or edema. Respiratory: Normal respiratory effort, no increased work of breathing. GI: Abdomen is soft, non tender, non distended, no abdominal masses. Liver and spleen not palpable.  No hernias appreciated.  Stool sample for occult testing is not indicated.   GU: No CVA tenderness.  No bladder fullness or masses.  Patient with uncircumcised phallus. Foreskin easily retracted  Urethral meatus is patent.  No penile discharge. No penile lesions or rashes. Scrotum without lesions, cysts, rashes and/or edema.  Testicles are located scrotally bilaterally. No masses are appreciated in the testicles. Left and right epididymis are normal. Rectal: Patient with  normal sphincter tone. Anus and perineum without scarring or rashes. No rectal masses are appreciated. Prostate is approximately 45 grams, no nodules are appreciated.    Skin: No rashes, bruises or suspicious lesions. Lymph: No cervical or inguinal adenopathy. Neurologic: Grossly intact, no focal deficits, moving all 4 extremities. Psychiatric: Normal mood and affect.  Laboratory Data: Lab Results  Component Value Date   WBC 5.3 02/17/2017   HGB 14.9 02/17/2017   HCT 42.5 02/17/2017   MCV 95.0 02/17/2017   PLT 165 02/17/2017    Lab Results  Component Value Date   CREATININE 0.81 02/17/2017    No results found for: PSA  No results found for: TESTOSTERONE  No results found for: HGBA1C  No results found for: TSH  No results found for: CHOL, HDL, CHOLHDL, VLDL, LDLCALC  Lab Results  Component Value Date   AST 29 02/17/2017   Lab Results  Component Value Date   ALT 25 02/17/2017   No components found for: ALKALINEPHOPHATASE No components found for: BILIRUBINTOTAL  No results found for: ESTRADIOL  I have reviewed the labs.   Pertinent Imaging: Results for ORLANDER, NORWOOD (MRN [id number]) as of 10/24/2018 10:26  Ref. Range 10/24/2018 10:03  Scan Result Unknown 112    Assessment & Plan:    1. BPH with LUTS Continue conservative management, avoiding bladder irritants and timed voiding's Most bothersome symptom is nocturia and urge incontinence Continue finasteride 5 mg daily, oxybutynin XL 10 mg and terazosin 2 mg daily Discussed cystoscopy to evaluate for BOO and patient deferred RTC in one year for I PSS, PVR and exam  2. Nocturia Continue to monitor  3. History of incomplete bladder emptying History of previously elevated PVRs.  PVR today acceptable on oxybutynin  4.  Tinea cruris Refilled nystatin cream twice daily  Return in about 1 year (around 10/24/2019) for IPSS, PVR and exam.  These notes generated with voice recognition software. I apologize for typographical errors.  Zara Council, PA-C  [facility] [address] [place],  [address] [phone]

## 2018-10-24 ENCOUNTER — Encounter: Payer: Self-pay | Admitting: Urology

## 2018-10-24 ENCOUNTER — Other Ambulatory Visit: Payer: Self-pay

## 2018-10-24 ENCOUNTER — Ambulatory Visit (INDEPENDENT_AMBULATORY_CARE_PROVIDER_SITE_OTHER): Payer: Medicare HMO | Admitting: Urology

## 2018-10-24 VITALS — BP 143/61 | HR 94 | Ht 66.0 in | Wt 172.9 lb

## 2018-10-24 DIAGNOSIS — R351 Nocturia: Secondary | ICD-10-CM

## 2018-10-24 DIAGNOSIS — N138 Other obstructive and reflux uropathy: Secondary | ICD-10-CM

## 2018-10-24 DIAGNOSIS — N401 Enlarged prostate with lower urinary tract symptoms: Secondary | ICD-10-CM | POA: Diagnosis not present

## 2018-10-24 DIAGNOSIS — B356 Tinea cruris: Secondary | ICD-10-CM | POA: Diagnosis not present

## 2018-10-24 DIAGNOSIS — Z87898 Personal history of other specified conditions: Secondary | ICD-10-CM | POA: Diagnosis not present

## 2018-10-24 LAB — BLADDER SCAN AMB NON-IMAGING: Scan Result: 112

## 2018-10-24 MED ORDER — FINASTERIDE 5 MG PO TABS: 5.0000 mg | ORAL_TABLET | Freq: Every day | ORAL | 3 refills | Status: DC

## 2018-10-24 MED ORDER — NYSTATIN 100000 UNIT/GM EX CREA: 1.0000 "application " | TOPICAL_CREAM | Freq: Two times a day (BID) | CUTANEOUS | 0 refills | Status: DC

## 2018-10-24 MED ORDER — TERAZOSIN HCL 2 MG PO CAPS: 2.0000 mg | ORAL_CAPSULE | Freq: Every day | ORAL | 3 refills | Status: DC

## 2018-10-24 MED ORDER — OXYBUTYNIN CHLORIDE ER 10 MG PO TB24: 10.0000 mg | ORAL_TABLET | Freq: Every day | ORAL | 1 refills | Status: DC

## 2018-11-11 DIAGNOSIS — M545 Low back pain: Secondary | ICD-10-CM | POA: Diagnosis not present

## 2018-11-11 DIAGNOSIS — I2510 Atherosclerotic heart disease of native coronary artery without angina pectoris: Secondary | ICD-10-CM | POA: Diagnosis not present

## 2018-11-11 DIAGNOSIS — I10 Essential (primary) hypertension: Secondary | ICD-10-CM | POA: Diagnosis not present

## 2018-11-11 DIAGNOSIS — R739 Hyperglycemia, unspecified: Secondary | ICD-10-CM | POA: Diagnosis not present

## 2018-11-12 DIAGNOSIS — H353131 Nonexudative age-related macular degeneration, bilateral, early dry stage: Secondary | ICD-10-CM | POA: Diagnosis not present

## 2019-02-07 DIAGNOSIS — N400 Enlarged prostate without lower urinary tract symptoms: Secondary | ICD-10-CM | POA: Diagnosis not present

## 2019-02-07 DIAGNOSIS — R739 Hyperglycemia, unspecified: Secondary | ICD-10-CM | POA: Diagnosis not present

## 2019-02-07 DIAGNOSIS — K5909 Other constipation: Secondary | ICD-10-CM | POA: Diagnosis not present

## 2019-02-07 DIAGNOSIS — I2510 Atherosclerotic heart disease of native coronary artery without angina pectoris: Secondary | ICD-10-CM | POA: Diagnosis not present

## 2019-02-07 DIAGNOSIS — Z79899 Other long term (current) drug therapy: Secondary | ICD-10-CM | POA: Diagnosis not present

## 2019-02-07 DIAGNOSIS — Z87891 Personal history of nicotine dependence: Secondary | ICD-10-CM | POA: Diagnosis not present

## 2019-02-07 DIAGNOSIS — E785 Hyperlipidemia, unspecified: Secondary | ICD-10-CM | POA: Diagnosis not present

## 2019-02-07 DIAGNOSIS — I10 Essential (primary) hypertension: Secondary | ICD-10-CM | POA: Diagnosis not present

## 2019-02-17 DIAGNOSIS — I10 Essential (primary) hypertension: Secondary | ICD-10-CM | POA: Diagnosis not present

## 2019-02-17 DIAGNOSIS — R739 Hyperglycemia, unspecified: Secondary | ICD-10-CM | POA: Diagnosis not present

## 2019-02-17 DIAGNOSIS — E7801 Familial hypercholesterolemia: Secondary | ICD-10-CM | POA: Diagnosis not present

## 2019-04-08 DIAGNOSIS — K5909 Other constipation: Secondary | ICD-10-CM | POA: Diagnosis not present

## 2019-04-08 DIAGNOSIS — D369 Benign neoplasm, unspecified site: Secondary | ICD-10-CM | POA: Diagnosis not present

## 2019-04-11 DIAGNOSIS — E7801 Familial hypercholesterolemia: Secondary | ICD-10-CM | POA: Diagnosis not present

## 2019-04-11 DIAGNOSIS — I10 Essential (primary) hypertension: Secondary | ICD-10-CM | POA: Diagnosis not present

## 2019-04-11 DIAGNOSIS — I25810 Atherosclerosis of coronary artery bypass graft(s) without angina pectoris: Secondary | ICD-10-CM | POA: Diagnosis not present

## 2019-04-11 DIAGNOSIS — I2510 Atherosclerotic heart disease of native coronary artery without angina pectoris: Secondary | ICD-10-CM | POA: Diagnosis not present

## 2019-05-08 DIAGNOSIS — R69 Illness, unspecified: Secondary | ICD-10-CM | POA: Diagnosis not present

## 2019-05-26 DIAGNOSIS — L853 Xerosis cutis: Secondary | ICD-10-CM | POA: Diagnosis not present

## 2019-05-26 DIAGNOSIS — L819 Disorder of pigmentation, unspecified: Secondary | ICD-10-CM | POA: Diagnosis not present

## 2019-05-26 DIAGNOSIS — Z87891 Personal history of nicotine dependence: Secondary | ICD-10-CM | POA: Diagnosis not present

## 2019-08-11 DIAGNOSIS — M9904 Segmental and somatic dysfunction of sacral region: Secondary | ICD-10-CM | POA: Diagnosis not present

## 2019-08-11 DIAGNOSIS — M5441 Lumbago with sciatica, right side: Secondary | ICD-10-CM | POA: Diagnosis not present

## 2019-08-11 DIAGNOSIS — M9903 Segmental and somatic dysfunction of lumbar region: Secondary | ICD-10-CM | POA: Diagnosis not present

## 2019-08-11 DIAGNOSIS — M461 Sacroiliitis, not elsewhere classified: Secondary | ICD-10-CM | POA: Diagnosis not present

## 2019-08-12 DIAGNOSIS — M461 Sacroiliitis, not elsewhere classified: Secondary | ICD-10-CM | POA: Diagnosis not present

## 2019-08-12 DIAGNOSIS — M5441 Lumbago with sciatica, right side: Secondary | ICD-10-CM | POA: Diagnosis not present

## 2019-08-12 DIAGNOSIS — M9903 Segmental and somatic dysfunction of lumbar region: Secondary | ICD-10-CM | POA: Diagnosis not present

## 2019-08-12 DIAGNOSIS — M9904 Segmental and somatic dysfunction of sacral region: Secondary | ICD-10-CM | POA: Diagnosis not present

## 2019-08-14 DIAGNOSIS — M9904 Segmental and somatic dysfunction of sacral region: Secondary | ICD-10-CM | POA: Diagnosis not present

## 2019-08-14 DIAGNOSIS — M461 Sacroiliitis, not elsewhere classified: Secondary | ICD-10-CM | POA: Diagnosis not present

## 2019-08-14 DIAGNOSIS — M5441 Lumbago with sciatica, right side: Secondary | ICD-10-CM | POA: Diagnosis not present

## 2019-08-14 DIAGNOSIS — M9903 Segmental and somatic dysfunction of lumbar region: Secondary | ICD-10-CM | POA: Diagnosis not present

## 2019-08-26 DIAGNOSIS — D369 Benign neoplasm, unspecified site: Secondary | ICD-10-CM | POA: Diagnosis not present

## 2019-08-26 DIAGNOSIS — K649 Unspecified hemorrhoids: Secondary | ICD-10-CM | POA: Diagnosis not present

## 2019-08-26 DIAGNOSIS — K5909 Other constipation: Secondary | ICD-10-CM | POA: Diagnosis not present

## 2019-08-28 ENCOUNTER — Other Ambulatory Visit: Payer: Self-pay

## 2019-08-28 ENCOUNTER — Encounter: Payer: Self-pay | Admitting: Urology

## 2019-08-28 ENCOUNTER — Ambulatory Visit: Payer: Medicare HMO | Admitting: Physician Assistant

## 2019-08-28 VITALS — BP 132/64 | HR 98 | Ht 68.0 in | Wt 175.0 lb

## 2019-08-28 DIAGNOSIS — B356 Tinea cruris: Secondary | ICD-10-CM | POA: Diagnosis not present

## 2019-08-28 DIAGNOSIS — R350 Frequency of micturition: Secondary | ICD-10-CM | POA: Diagnosis not present

## 2019-08-28 DIAGNOSIS — N401 Enlarged prostate with lower urinary tract symptoms: Secondary | ICD-10-CM

## 2019-08-28 DIAGNOSIS — N138 Other obstructive and reflux uropathy: Secondary | ICD-10-CM | POA: Diagnosis not present

## 2019-08-28 LAB — URINALYSIS, COMPLETE
Bilirubin, UA: NEGATIVE
Glucose, UA: NEGATIVE
Ketones, UA: NEGATIVE
Nitrite, UA: NEGATIVE
Protein,UA: NEGATIVE
Specific Gravity, UA: 1.025 (ref 1.005–1.030)
Urobilinogen, Ur: 0.2 mg/dL (ref 0.2–1.0)
pH, UA: 6 (ref 5.0–7.5)

## 2019-08-28 LAB — MICROSCOPIC EXAMINATION: WBC, UA: >30 /hpf — AB (ref 0–5)

## 2019-08-28 LAB — BLADDER SCAN AMB NON-IMAGING: Scan Result: 151

## 2019-08-28 MED ORDER — TERAZOSIN HCL 2 MG PO CAPS: 2.0000 mg | ORAL_CAPSULE | Freq: Every day | ORAL | 3 refills | Status: DC

## 2019-08-28 MED ORDER — OXYBUTYNIN CHLORIDE ER 10 MG PO TB24: 10.0000 mg | ORAL_TABLET | Freq: Every day | ORAL | 3 refills | Status: DC

## 2019-08-28 MED ORDER — FINASTERIDE 5 MG PO TABS: 5.0000 mg | ORAL_TABLET | Freq: Every day | ORAL | 3 refills | Status: DC

## 2019-08-28 NOTE — Progress Notes (Signed)
08/28/2019 2:52 PM   Timothy Bass 1932/12/27 [id number]  CC: BPH follow-up  HPI: Timothy Bass is a 86 y.o. male with BPH with LUTS, nocturia, tinea cruris, and incomplete bladder emptying who presents today for annual follow-up.  He was seen most recently by Timothy Bass on 10/24/2018 for the same.  Today, patient reports bothersome hourly frequency and nocturia x3-4 as well as urinary leakage.  He is sufficiently bothered by the symptoms that he is interested in being evaluated for bladder outlet procedure at this time.  He continues to take finasteride 5 mg, oxybutynin XL 10 mg, and Terazosin 2 mg daily for management of his urinary symptoms.  Additionally, patient requests a refill of nystatin cream.  He states he uses this daily for management of inguinal itching.  He states he does not have a rash at this time, however whenever he goes longer than 1 day without using the cream, his pruritus returns.  Of note, he was seen by his PCP 3 months ago with reports of diffuse pruritus and counseled on management of xerosis.  Patient is a cardiology patient of Dr. Nehemiah Massed.  Past cardiovascular history notable for CAD s/p CABG in 1995, hypertension, hyperlipidemia, and peripheral vascular disease.  He denies a history of pulmonary problems  IPSS 14/1 today, as below; previously 17/3.  PVR 151 mL today; previously 112 mL.  IPSS    Row Name 08/28/19 1400         International Prostate Symptom Score   How often have you had the sensation of not emptying your bladder?  Not at All     How often have you had to urinate less than every two hours?  Less than half the time     How often have you found you stopped and started again several times when you urinated?  Less than half the time     How often have you found it difficult to postpone urination?  Less than half the time     How often have you had a weak urinary stream?  Less than half the time     How often have you had to strain to start  urination?  About half the time     How many times did you typically get up at night to urinate?  3 Times     Total IPSS Score  14       Quality of Life due to urinary symptoms   If you were to spend the rest of your life with your urinary condition just the way it is now how would you feel about that?  Pleased        In-office UA today positive for trace-intact blood and 2+ leukocyte esterase; urine microscopy with >30 WBCs/HPF and moderate bacteria.  PMH: Past Medical History:  Diagnosis Date  . BPH without obstruction/lower urinary tract symptoms   . Coronary artery disease   . Dysuria   . Enlarged prostate   . GERD (gastroesophageal reflux disease)   . Hypercholesteremia   . Hyperglycemia   . Hyperlipemia   . Hypertension   . Myocardial infarction ([place])   . Nocturia   . Prostatic hypertrophy   . Urinary frequency   . Urinary incontinence     Surgical History: Past Surgical History:  Procedure Laterality Date  . CARDIAC CATHETERIZATION N/A 01/05/2016   Procedure: LEFT HEART CATH AND CORS/GRAFTS ANGIOGRAPHY;  Surgeon: Corey Skains, MD;  Location: [place] [facility];  Service:  Cardiovascular;  Laterality: N/A;  . CARDIAC CATHETERIZATION N/A 01/05/2016   Procedure: Coronary Stent Intervention;  Surgeon: Isaias Cowman, MD;  Location: [place] [facility];  Service: Cardiovascular;  Laterality: N/A;  . cardiac stents  2017  . COLONOSCOPY WITH PROPOFOL N/A 07/02/2017   Procedure: COLONOSCOPY WITH PROPOFOL;  Surgeon: Lollie Sails, MD;  Location: [facility] ENDOSCOPY;  Service: Endoscopy;  Laterality: N/A;  . CORONARY ARTERY BYPASS GRAFT     x2  . ESOPHAGOGASTRODUODENOSCOPY (EGD) WITH PROPOFOL N/A 05/27/2015   Procedure: ESOPHAGOGASTRODUODENOSCOPY (EGD) WITH PROPOFOL;  Surgeon: Hulen Luster, MD;  Location: [facility] ENDOSCOPY;  Service: Gastroenterology;  Laterality: N/A;  . VASECTOMY      Home Medications:  Allergies as of 08/28/2019      Reactions   Peanuts [peanut Oil]        Medication List       Accurate as of August 28, 2019  2:52 PM. If you have any questions, ask your nurse or doctor.        amLODipine-benazepril 5-10 MG capsule Commonly known as: LOTREL   ASPIRIN ADULT LOW DOSE PO Take 80 mg by mouth daily.   atorvastatin 80 MG tablet Commonly known as: Lipitor Take 1 tablet (80 mg total) by mouth daily.   clopidogrel 75 MG tablet Commonly known as: PLAVIX Take 1 tablet (75 mg total) by mouth daily with breakfast.   finasteride 5 MG tablet Commonly known as: PROSCAR Take 1 tablet (5 mg total) by mouth daily.   gemfibrozil 600 MG tablet Commonly known as: LOPID Take 600 mg by mouth 2 (two) times daily before a meal.   glucosamine-chondroitin 500-400 MG tablet Take 1 tablet by mouth 3 (three) times daily.   ipratropium 0.03 % nasal spray Commonly known as: ATROVENT Place 2 sprays into both nostrils every 12 (twelve) hours.   isosorbide mononitrate 30 MG 24 hr tablet Commonly known as: IMDUR Take 1 tablet (30 mg total) by mouth daily.   mometasone 0.1 % lotion Commonly known as: ELOCON INSTILL 4 DROPS TO EACH EAR 1 TO 2 TIMES PER WEEK AS NEEDED FOR DRY SKIN AND ITCHING   Multi-Vitamins Tabs Take 1 tablet by mouth daily.   nystatin cream Commonly known as: MYCOSTATIN Apply 1 application topically 2 (two) times daily.   omega-3 acid ethyl esters 1 g capsule Commonly known as: LOVAZA Take by mouth 2 (two) times daily.   oxybutynin 10 MG 24 hr tablet Commonly known as: DITROPAN-XL Take 1 tablet (10 mg total) by mouth at bedtime.   oxyCODONE-acetaminophen 5-325 MG tablet Commonly known as: Roxicet Take 1 tablet by mouth every 6 (six) hours as needed.   solifenacin 5 MG tablet Commonly known as: VESIcare Take 1 tablet (5 mg total) by mouth daily.   terazosin 2 MG capsule Commonly known as: HYTRIN Take 1 capsule (2 mg total) by mouth at bedtime.       Allergies:  Allergies  Allergen Reactions  . Peanuts [Peanut  Oil]     Family History: Family History  Family history unknown: Yes    Social History:   reports that he quit smoking about 26 years ago. His smoking use included cigarettes. He has a 30.00 pack-year smoking history. He has never used smokeless tobacco. He reports that he does not drink alcohol or use drugs.  Physical Exam: BP 132/64   Pulse 98   Ht 5\' 8"  (1.727 m)   Wt 175 lb (79.4 kg)   BMI 26.61 kg/m  Constitutional:  Alert and oriented, no acute distress, nontoxic appearing HEENT: [place], AT Cardiovascular: No clubbing, cyanosis, or edema Respiratory: Normal respiratory effort, no increased work of breathing Skin: Intact skin of the bilateral inguinal creases without erythema or rashes Neurologic: Grossly intact, no focal deficits, moving all 4 extremities Psychiatric: Normal mood and affect  Laboratory Data: Results for orders placed or performed in visit on 08/28/19  Microscopic Examination   URINE  Result Value Ref Range   WBC, UA >30 (A) 0 - 5 /hpf   RBC 0-2 0 - 2 /hpf   Epithelial Cells (non renal) 0-10 0 - 10 /hpf   Crystals Present (A) N/A   Crystal Type Amorphous Sediment N/A   Bacteria, UA Moderate (A) None seen/Few  Urinalysis, Complete  Result Value Ref Range   Specific Gravity, UA 1.025 1.005 - 1.030   pH, UA 6.0 5.0 - 7.5   Color, UA Yellow Yellow   Appearance Ur Cloudy (A) Clear   Leukocytes,UA 2+ (A) Negative   Protein,UA Negative Negative/Trace   Glucose, UA Negative Negative   Ketones, UA Negative Negative   RBC, UA Trace (A) Negative   Bilirubin, UA Negative Negative   Urobilinogen, Ur 0.2 0.2 - 1.0 mg/dL   Nitrite, UA Negative Negative   Microscopic Examination See below:   Bladder Scan (Post Void Residual) in office  Result Value Ref Range   Scan Result 151    Assessment & Plan:   1. Benign prostatic hyperplasia with lower urinary tract symptoms, symptom details unspecified 86 year old male with a history of BPH on finasteride,  Terazosin, and oxybutynin presents for annual follow-up with reports of worsening urinary symptoms and interested in pursuing bladder outlet procedures.  We will schedule cystoscopy and TRUS for bladder outlet consultation at this time.  Patient does have a history of cardiovascular disease, would require cardiac clearance in advance of procedure. - Bladder Scan (Post Void Residual) in office - finasteride (PROSCAR) 5 MG tablet; Take 1 tablet (5 mg total) by mouth daily.  Dispense: 90 tablet; Refill: 3 - terazosin (HYTRIN) 2 MG capsule; Take 1 capsule (2 mg total) by mouth at bedtime.  Dispense: 90 capsule; Refill: 3  2. Urinary frequency Likely secondary to #1 above, however UA is suspicious for UTI with pyuria and bacteriuria.  Will send for culture for further evaluation. - Urinalysis, Complete - CULTURE, URINE COMPREHENSIVE - oxybutynin (DITROPAN-XL) 10 MG 24 hr tablet; Take 1 tablet (10 mg total) by mouth at bedtime.  Dispense: 90 tablet; Refill: 3  3. Tinea cruris Skin intact today, no evidence of active infection.  Will defer nystatin refill.  Return in about 3 months (around 11/27/2019) for Cysto with bladder outlet consultation.  Debroah Loop, PA-C  [facility] [address] [place], [place] [address] [phone]

## 2019-08-29 ENCOUNTER — Other Ambulatory Visit: Payer: Self-pay | Admitting: Urology

## 2019-09-01 DIAGNOSIS — Z0000 Encounter for general adult medical examination without abnormal findings: Secondary | ICD-10-CM | POA: Diagnosis not present

## 2019-09-01 DIAGNOSIS — Z951 Presence of aortocoronary bypass graft: Secondary | ICD-10-CM | POA: Diagnosis not present

## 2019-09-01 DIAGNOSIS — E785 Hyperlipidemia, unspecified: Secondary | ICD-10-CM | POA: Diagnosis not present

## 2019-09-01 DIAGNOSIS — N400 Enlarged prostate without lower urinary tract symptoms: Secondary | ICD-10-CM | POA: Diagnosis not present

## 2019-09-01 DIAGNOSIS — K649 Unspecified hemorrhoids: Secondary | ICD-10-CM | POA: Diagnosis not present

## 2019-09-01 DIAGNOSIS — K5909 Other constipation: Secondary | ICD-10-CM | POA: Diagnosis not present

## 2019-09-01 DIAGNOSIS — I10 Essential (primary) hypertension: Secondary | ICD-10-CM | POA: Diagnosis not present

## 2019-09-01 DIAGNOSIS — E119 Type 2 diabetes mellitus without complications: Secondary | ICD-10-CM | POA: Diagnosis not present

## 2019-09-01 DIAGNOSIS — I2510 Atherosclerotic heart disease of native coronary artery without angina pectoris: Secondary | ICD-10-CM | POA: Diagnosis not present

## 2019-09-01 LAB — CULTURE, URINE COMPREHENSIVE

## 2019-09-02 ENCOUNTER — Telehealth: Payer: Self-pay | Admitting: Physician Assistant

## 2019-09-02 MED ORDER — SULFAMETHOXAZOLE-TRIMETHOPRIM 800-160 MG PO TABS: 1.0000 | ORAL_TABLET | Freq: Two times a day (BID) | ORAL | 0 refills | Status: AC

## 2019-09-02 NOTE — Telephone Encounter (Signed)
I just spoke with the patient via telephone.  I explained that his recent urine culture came back positive, consistent with an infection.  He denies dysuria today.  I explained that given his reports of recent worsening in his urinary urgency and frequency, I would like to treat him for a possible UTI at this time.  I have sent a prescription for Bactrim DS twice daily x7 days to his pharmacy.  He expressed understanding.  I contacted the patient via the interpreter line, however patient was able to communicate with me without the use of the interpreter.

## 2019-09-10 ENCOUNTER — Other Ambulatory Visit: Payer: Self-pay

## 2019-09-10 ENCOUNTER — Ambulatory Visit: Payer: Medicare HMO | Admitting: Physician Assistant

## 2019-09-10 ENCOUNTER — Encounter: Payer: Self-pay | Admitting: Physician Assistant

## 2019-09-10 VITALS — BP 132/64 | HR 102 | Ht 68.0 in | Wt 177.1 lb

## 2019-09-10 DIAGNOSIS — N401 Enlarged prostate with lower urinary tract symptoms: Secondary | ICD-10-CM | POA: Diagnosis not present

## 2019-09-10 DIAGNOSIS — R300 Dysuria: Secondary | ICD-10-CM | POA: Diagnosis not present

## 2019-09-10 DIAGNOSIS — R3914 Feeling of incomplete bladder emptying: Secondary | ICD-10-CM

## 2019-09-10 LAB — BLADDER SCAN AMB NON-IMAGING: Scan Result: >282

## 2019-09-10 MED ORDER — TAMSULOSIN HCL 0.4 MG PO CAPS: 0.4000 mg | ORAL_CAPSULE | Freq: Two times a day (BID) | ORAL | 1 refills | Status: DC

## 2019-09-10 MED ORDER — TERAZOSIN HCL 2 MG PO CAPS: 2.0000 mg | ORAL_CAPSULE | Freq: Two times a day (BID) | ORAL | 2 refills | Status: DC

## 2019-09-10 NOTE — Progress Notes (Signed)
In and Out Catheterization  Patient is present today for a I & O catheterization due to incomplete bladder emptying. Patient was cleaned and prepped in a sterile fashion with betadine and 2% lidocaine jelly was instilled into the urethra . A 16FR coude Coloplast SpeediCath was inserted no complications were noted , 346ml of urine return was noted, urine was yellow in color. Bladder was drained  And catheter was removed with out difficulty.    Performed by: Debroah Loop, PA-C

## 2019-09-10 NOTE — Progress Notes (Signed)
09/10/2019 8:51 AM   Timothy Bass [birth year] [id number]  CC: Urinary urgency, frequency  HPI: Timothy Bass is a 86 y.o. male with PMH BPH with LUTS, nocturia, tinea cruris, and incomplete bladder emptying who presents today for evaluation of urinary urgency and frequency.  I saw him in clinic on August 28, 2019 with reports of bothersome hourly frequency and nocturia x3-4 as well as urinary leakage.  He is takes finasteride 5 mg, oxybutynin XL 10 mg, and Terazosin 2 mg daily for management of his urinary symptoms.  Urine culture resulted with pansensitive Staph lugdunensis; I prescribed Bactrim DS twice daily x7 days.  Additionally, we planned for cystoscopy and TRUS for bladder outlet consultation later this year.  Today, patient reports he completed all of his prescribed antibiotic.  He continues to experience bothersome urinary frequency, leakage, and difficulty urinating.  He does report chronic constipation at baseline, however his stools have been softer since taking antibiotics.  He notes that sometimes when he sits down to urinate and he unintentionally passes formed stool.  In-office UA today positive for trace-intact blood; urine microscopy pan negative. PVR >268mL.  PMH: Past Medical History:  Diagnosis Date  . BPH without obstruction/lower urinary tract symptoms   . Coronary artery disease   . Dysuria   . Enlarged prostate   . GERD (gastroesophageal reflux disease)   . Hypercholesteremia   . Hyperglycemia   . Hyperlipemia   . Hypertension   . Myocardial infarction ([place])   . Nocturia   . Prostatic hypertrophy   . Urinary frequency   . Urinary incontinence     Surgical History: Past Surgical History:  Procedure Laterality Date  . CARDIAC CATHETERIZATION N/A 01/05/2016   Procedure: LEFT HEART CATH AND CORS/GRAFTS ANGIOGRAPHY;  Surgeon: Corey Skains, MD;  Location: [place] [facility];  Service: Cardiovascular;  Laterality: N/A;  . CARDIAC CATHETERIZATION N/A  01/05/2016   Procedure: Coronary Stent Intervention;  Surgeon: Isaias Cowman, MD;  Location: [place] [facility];  Service: Cardiovascular;  Laterality: N/A;  . cardiac stents  2017  . COLONOSCOPY WITH PROPOFOL N/A 07/02/2017   Procedure: COLONOSCOPY WITH PROPOFOL;  Surgeon: Lollie Sails, MD;  Location: [facility] ENDOSCOPY;  Service: Endoscopy;  Laterality: N/A;  . CORONARY ARTERY BYPASS GRAFT     x2  . ESOPHAGOGASTRODUODENOSCOPY (EGD) WITH PROPOFOL N/A 05/27/2015   Procedure: ESOPHAGOGASTRODUODENOSCOPY (EGD) WITH PROPOFOL;  Surgeon: Hulen Luster, MD;  Location: [facility] ENDOSCOPY;  Service: Gastroenterology;  Laterality: N/A;  . VASECTOMY      Home Medications:  Allergies as of 09/10/2019      Reactions   Peanuts [peanut Oil]       Medication List       Accurate as of September 10, 2019  8:51 AM. If you have any questions, ask your nurse or doctor.        amLODipine-benazepril 5-10 MG capsule Commonly known as: LOTREL   ASPIRIN ADULT LOW DOSE PO Take 80 mg by mouth daily.   atorvastatin 80 MG tablet Commonly known as: Lipitor Take 1 tablet (80 mg total) by mouth daily.   clopidogrel 75 MG tablet Commonly known as: PLAVIX Take 1 tablet (75 mg total) by mouth daily with breakfast.   finasteride 5 MG tablet Commonly known as: PROSCAR Take 1 tablet (5 mg total) by mouth daily.   gemfibrozil 600 MG tablet Commonly known as: LOPID Take 600 mg by mouth 2 (two) times daily before a meal.   glucosamine-chondroitin 500-400  MG tablet Take 1 tablet by mouth 3 (three) times daily.   Hydrocortisone (Perianal) 1 % Crea   ipratropium 0.03 % nasal spray Commonly known as: ATROVENT Place 2 sprays into both nostrils every 12 (twelve) hours.   isosorbide mononitrate 30 MG 24 hr tablet Commonly known as: IMDUR Take 1 tablet (30 mg total) by mouth daily.   mometasone 0.1 % lotion Commonly known as: ELOCON INSTILL 4 DROPS TO EACH EAR 1 TO 2 TIMES PER WEEK AS NEEDED FOR DRY SKIN AND  ITCHING   Multi-Vitamins Tabs Take 1 tablet by mouth daily.   nystatin cream Commonly known as: MYCOSTATIN Apply 1 application topically 2 (two) times daily.   omega-3 acid ethyl esters 1 g capsule Commonly known as: LOVAZA Take by mouth 2 (two) times daily.   oxybutynin 10 MG 24 hr tablet Commonly known as: DITROPAN-XL Take 1 tablet (10 mg total) by mouth at bedtime.   oxyCODONE-acetaminophen 5-325 MG tablet Commonly known as: Roxicet Take 1 tablet by mouth every 6 (six) hours as needed.   solifenacin 5 MG tablet Commonly known as: VESIcare Take 1 tablet (5 mg total) by mouth daily.   terazosin 2 MG capsule Commonly known as: HYTRIN Take 1 capsule (2 mg total) by mouth at bedtime.       Allergies:  Allergies  Allergen Reactions  . Peanuts [Peanut Oil]     Family History: Family History  Family history unknown: Yes    Social History:   reports that he quit smoking about 26 years ago. His smoking use included cigarettes. He has a 30.00 pack-year smoking history. He has never used smokeless tobacco. He reports that he does not drink alcohol or use drugs.  Physical Exam: BP 132/64   Pulse (!) 102   Ht 5\' 8"  (1.727 m)   Wt 177 lb 1.6 oz (80.3 kg)   BMI 26.93 kg/m   Constitutional:  Alert and oriented, no acute distress, nontoxic appearing HEENT: [place], AT Cardiovascular: No clubbing, cyanosis, or edema Respiratory: Normal respiratory effort, no increased work of breathing Skin: No rashes, bruises or suspicious lesions Neurologic: Grossly intact, no focal deficits, moving all 4 extremities Psychiatric: Normal mood and affect  Laboratory Data: Results for orders placed or performed in visit on 09/10/19  Microscopic Examination   URINE  Result Value Ref Range   WBC, UA 0-5 0 - 5 /hpf   RBC 0-2 0 - 2 /hpf   Epithelial Cells (non renal) 0-10 0 - 10 /hpf   Bacteria, UA Few None seen/Few  Urinalysis, Complete  Result Value Ref Range   Specific Gravity, UA  1.025 1.005 - 1.030   pH, UA 6.5 5.0 - 7.5   Color, UA Yellow Yellow   Appearance Ur Clear Clear   Leukocytes,UA Negative Negative   Protein,UA Negative Negative/Trace   Glucose, UA Negative Negative   Ketones, UA Negative Negative   RBC, UA Trace (A) Negative   Bilirubin, UA Negative Negative   Urobilinogen, Ur 0.2 0.2 - 1.0 mg/dL   Nitrite, UA Negative Negative   Microscopic Examination See below:   Bladder Scan (Post Void Residual) in office  Result Value Ref Range   Scan Result >282    Assessment & Plan:   1. Benign prostatic hyperplasia with incomplete bladder emptying 86 year old male with a history of BPH with LUTS including urgency and frequency refractory to multiple medications here with reports of continued bothersome urinary frequency urgency, and difficulty urinating.  UA reassuring for infection today.  PVR slightly elevated over baseline today.  Counseled patient to increase Terazosin dose to 2 mg twice daily to increase urinary drainage.  I also elected to in and out cath him today to drain his bladder fully, see separate procedure note for details.  I would like him to follow-up in clinic in 2 days for PVR to check stability of bladder volume.  Lastly, we will plan to move up his bladder outlet consultation to soonest available.  Patient expressed understanding. - Urinalysis, Complete - Bladder Scan (Post Void Residual) in office - terazosin (HYTRIN) 2 MG capsule; Take 1 capsule (2 mg total) by mouth 2 (two) times daily.  Dispense: 60 capsule; Refill: 2   Return in about 2 days (around 09/12/2019) for PVR.  Debroah Loop, PA-C  [facility] [address], [place], [place] [address] [phone]

## 2019-09-10 NOTE — Telephone Encounter (Signed)
Patient notified he is not to take flomax. He is suppose to pick up Terazosin and take it 2 times daily.

## 2019-09-10 NOTE — Telephone Encounter (Signed)
Pt left vm he needed Flomax rx called in to [facility] [address] he did not specify what Phamacy in vm.

## 2019-09-11 LAB — URINALYSIS, COMPLETE
Bilirubin, UA: NEGATIVE
Glucose, UA: NEGATIVE
Ketones, UA: NEGATIVE
Leukocytes,UA: NEGATIVE
Nitrite, UA: NEGATIVE
Protein,UA: NEGATIVE
Specific Gravity, UA: 1.025 (ref 1.005–1.030)
Urobilinogen, Ur: 0.2 mg/dL (ref 0.2–1.0)
pH, UA: 6.5 (ref 5.0–7.5)

## 2019-09-11 LAB — MICROSCOPIC EXAMINATION

## 2019-09-12 ENCOUNTER — Encounter: Payer: Self-pay | Admitting: Physician Assistant

## 2019-09-12 ENCOUNTER — Other Ambulatory Visit: Payer: Self-pay

## 2019-09-12 ENCOUNTER — Ambulatory Visit: Payer: Medicare HMO | Admitting: Physician Assistant

## 2019-09-12 VITALS — BP 114/63 | HR 84 | Ht 68.0 in | Wt 177.0 lb

## 2019-09-12 DIAGNOSIS — N401 Enlarged prostate with lower urinary tract symptoms: Secondary | ICD-10-CM | POA: Diagnosis not present

## 2019-09-12 DIAGNOSIS — R3914 Feeling of incomplete bladder emptying: Secondary | ICD-10-CM | POA: Diagnosis not present

## 2019-09-12 LAB — BLADDER SCAN AMB NON-IMAGING

## 2019-09-12 NOTE — Progress Notes (Signed)
09/12/2019 9:15 AM   Timothy Bass 1933/02/14 [id number]  CC: Follow-up PVR  HPI: Timothy Bass is a 86 y.o. male with PMH BPH with LUTS, nocturia, tinea cruris, and incomplete bladder emptying who presents today for follow-up of urinary urgency and frequency.  I saw him in clinic 2 days ago with reports of bothersome urinary frequency, leakage, and difficulty urinating following a course of antibiotics for a staph lugdunensis UTI.  PVR >282 mL at that time; I counseled him to increase Terazosin to 2 mg twice daily and performed in and out catheterization during his visit with reassuring UA. Additionally, patient has reported progressive worsening of his LUTS and is scheduled to undergo bladder outlet consultation with Dr. Diamantina Providence on 09/29/2019.  Today, patient reports his urinary frequency, leakage, and difficulty urinating has significantly improved since I saw him 2 days ago.  He states he is still taking Terazosin only once daily.  PVR 274mL.  PMH: Past Medical History:  Diagnosis Date  . BPH without obstruction/lower urinary tract symptoms   . Coronary artery disease   . Dysuria   . Enlarged prostate   . GERD (gastroesophageal reflux disease)   . Hypercholesteremia   . Hyperglycemia   . Hyperlipemia   . Hypertension   . Myocardial infarction ([place])   . Nocturia   . Prostatic hypertrophy   . Urinary frequency   . Urinary incontinence     Surgical History: Past Surgical History:  Procedure Laterality Date  . CARDIAC CATHETERIZATION N/A 01/05/2016   Procedure: LEFT HEART CATH AND CORS/GRAFTS ANGIOGRAPHY;  Surgeon: Corey Skains, MD;  Location: [place] [facility];  Service: Cardiovascular;  Laterality: N/A;  . CARDIAC CATHETERIZATION N/A 01/05/2016   Procedure: Coronary Stent Intervention;  Surgeon: Isaias Cowman, MD;  Location: [place] [facility];  Service: Cardiovascular;  Laterality: N/A;  . cardiac stents  2017  . COLONOSCOPY WITH PROPOFOL N/A 07/02/2017    Procedure: COLONOSCOPY WITH PROPOFOL;  Surgeon: Lollie Sails, MD;  Location: [facility] ENDOSCOPY;  Service: Endoscopy;  Laterality: N/A;  . CORONARY ARTERY BYPASS GRAFT     x2  . ESOPHAGOGASTRODUODENOSCOPY (EGD) WITH PROPOFOL N/A 05/27/2015   Procedure: ESOPHAGOGASTRODUODENOSCOPY (EGD) WITH PROPOFOL;  Surgeon: Hulen Luster, MD;  Location: [facility] ENDOSCOPY;  Service: Gastroenterology;  Laterality: N/A;  . VASECTOMY      Home Medications:  Allergies as of 09/12/2019      Reactions   Peanuts [peanut Oil]       Medication List       Accurate as of September 12, 2019  9:15 AM. If you have any questions, ask your nurse or doctor.        amLODipine-benazepril 5-10 MG capsule Commonly known as: LOTREL   ASPIRIN ADULT LOW DOSE PO Take 80 mg by mouth daily.   atorvastatin 80 MG tablet Commonly known as: Lipitor Take 1 tablet (80 mg total) by mouth daily.   clopidogrel 75 MG tablet Commonly known as: PLAVIX Take 1 tablet (75 mg total) by mouth daily with breakfast.   finasteride 5 MG tablet Commonly known as: PROSCAR Take 1 tablet (5 mg total) by mouth daily.   gemfibrozil 600 MG tablet Commonly known as: LOPID Take 600 mg by mouth 2 (two) times daily before a meal.   glucosamine-chondroitin 500-400 MG tablet Take 1 tablet by mouth 3 (three) times daily.   Hydrocortisone (Perianal) 1 % Crea   ipratropium 0.03 % nasal spray Commonly known as: ATROVENT Place 2 sprays into  both nostrils every 12 (twelve) hours.   isosorbide mononitrate 30 MG 24 hr tablet Commonly known as: IMDUR Take 1 tablet (30 mg total) by mouth daily.   mometasone 0.1 % lotion Commonly known as: ELOCON INSTILL 4 DROPS TO EACH EAR 1 TO 2 TIMES PER WEEK AS NEEDED FOR DRY SKIN AND ITCHING   Multi-Vitamins Tabs Take 1 tablet by mouth daily.   nystatin cream Commonly known as: MYCOSTATIN Apply 1 application topically 2 (two) times daily.   omega-3 acid ethyl esters 1 g capsule Commonly known as: LOVAZA Take  by mouth 2 (two) times daily.   oxybutynin 10 MG 24 hr tablet Commonly known as: DITROPAN-XL Take 1 tablet (10 mg total) by mouth at bedtime.   oxyCODONE-acetaminophen 5-325 MG tablet Commonly known as: Roxicet Take 1 tablet by mouth every 6 (six) hours as needed.   solifenacin 5 MG tablet Commonly known as: VESIcare Take 1 tablet (5 mg total) by mouth daily.   terazosin 2 MG capsule Commonly known as: HYTRIN Take 1 capsule (2 mg total) by mouth 2 (two) times daily.       Allergies:  Allergies  Allergen Reactions  . Peanuts [Peanut Oil]     Family History: Family History  Family history unknown: Yes    Social History:   reports that he quit smoking about 26 years ago. His smoking use included cigarettes. He has a 30.00 pack-year smoking history. He has never used smokeless tobacco. He reports that he does not drink alcohol or use drugs.  Physical Exam: BP 114/63 (BP Location: Left Arm, Patient Position: Sitting, Cuff Size: Normal)   Pulse 84   Ht 5\' 8"  (1.727 m)   Wt 177 lb (80.3 kg)   BMI 26.91 kg/m   Constitutional:  Alert and oriented, no acute distress, nontoxic appearing HEENT: [place], AT Cardiovascular: No clubbing, cyanosis, or edema Respiratory: Normal respiratory effort, no increased work of breathing Skin: No rashes, bruises or suspicious lesions Neurologic: Grossly intact, no focal deficits, moving all 4 extremities Psychiatric: Normal mood and affect  Laboratory Data: Results for orders placed or performed in visit on 09/12/19  Bladder Scan (Post Void Residual) in office  Result Value Ref Range   Scan Result 274mL    Assessment & Plan:   1. Benign prostatic hyperplasia with incomplete bladder emptying PVR slightly elevated today, though less than in clinic 2 days ago.  Counseled patient to increase Terazosin to twice daily, as previously recommended.  He expressed understanding.  Counseled patient to continue with plans for bladder outlet  consultation next month and to contact our office or proceed to the emergency room if he develops the inability to urinate or lower abdominal pain in the interim. - Bladder Scan (Post Void Residual) in office  Return if symptoms worsen or fail to improve.  Timothy Loop, PA-C  [facility] [address] [place], [place] [address] [phone]

## 2019-09-12 NOTE — Patient Instructions (Addendum)
Take terazosin twice daily. If you feel like you can't urinate or are having pain, call our clinic during office hours or go to the Emergency Room.

## 2019-09-12 NOTE — Progress Notes (Signed)
Language line used, Interpreter ID [id number]

## 2019-09-19 ENCOUNTER — Ambulatory Visit: Payer: Self-pay | Admitting: Physician Assistant

## 2019-09-29 ENCOUNTER — Ambulatory Visit (INDEPENDENT_AMBULATORY_CARE_PROVIDER_SITE_OTHER): Payer: Medicare HMO | Admitting: Urology

## 2019-09-29 ENCOUNTER — Other Ambulatory Visit: Payer: Self-pay

## 2019-09-29 ENCOUNTER — Encounter: Payer: Self-pay | Admitting: Urology

## 2019-09-29 VITALS — BP 162/76 | HR 90 | Ht 67.0 in | Wt 178.0 lb

## 2019-09-29 DIAGNOSIS — R3914 Feeling of incomplete bladder emptying: Secondary | ICD-10-CM

## 2019-09-29 DIAGNOSIS — N401 Enlarged prostate with lower urinary tract symptoms: Secondary | ICD-10-CM | POA: Diagnosis not present

## 2019-09-29 DIAGNOSIS — N390 Urinary tract infection, site not specified: Secondary | ICD-10-CM

## 2019-09-29 LAB — URINALYSIS, COMPLETE
Bilirubin, UA: NEGATIVE
Glucose, UA: NEGATIVE
Ketones, UA: NEGATIVE
Nitrite, UA: NEGATIVE
Protein,UA: NEGATIVE
RBC, UA: NEGATIVE
Specific Gravity, UA: 1.025 (ref 1.005–1.030)
Urobilinogen, Ur: 0.2 mg/dL (ref 0.2–1.0)
pH, UA: 6 (ref 5.0–7.5)

## 2019-09-29 LAB — MICROSCOPIC EXAMINATION: Bacteria, UA: NONE SEEN

## 2019-09-29 NOTE — Patient Instructions (Signed)

## 2019-09-29 NOTE — Progress Notes (Signed)
#  [id number] 

## 2019-09-29 NOTE — Progress Notes (Signed)
Cystoscopy and TRUS Procedure Note:  Indication: Worsening urinary symptoms, PVR greater than 58 mL  86 year old male with worsening urinary symptoms despite maximal medical therapy of weak stream, feeling of incomplete emptying, urgency, frequency, and recent UTI.  He has been followed by our PA's Zara Council and Debroah Loop, and he was set up for evaluation for an outlet procedure today.  Language line was used for translation during today's visit.  After informed consent and discussion of the procedure and its risks, FILIMON WETTER was positioned and prepped in the standard fashion. Cystoscopy was performed with a flexible cystoscope. The urethra, bladder neck and entire bladder was visualized in a standard fashion. The prostate was very large with a high bladder neck and large median lobe.  Mild hematuria after passing scope into the bladder which limited vision.  No obvious bladder tumors.  Moderate to severe bladder trabeculations.  Retroflexion shows a very large intravesical protrusion of the prostate.  The patient was then positioned on his left side and the transrectal ultrasound probe advanced into the rectum.  This demonstrated a 160 g prostate with a very large median lobe.  Findings: Massive 160 g prostate  Assessment and Plan: We discussed the risks and benefits of HoLEP at length.  The procedure requires general anesthesia and takes 2 to 3 hours, and a holmium laser is used to enucleate the prostate and push this tissue into the bladder.  A morcellator is then used to remove this tissue, which is sent for pathology.  The vast majority of patients are able to discharge the same day with a catheter in place for 2 to 3 days, and will follow-up in clinic for a voiding trial.  Approximately 5% of patients will be admitted overnight to monitor the urine, or if they have multiple co-morbidities.  We specifically discussed the risks of bleeding, infection, retrograde ejaculation,  temporary urgency and urge incontinence, very low risk of long-term incontinence, pathologic evaluation of prostate tissue and possible detection of prostate cancer or other malignancy, and possible need for additional procedures.  Schedule HoLEP, needs cardiology clearance to hold anticoagulation   Nickolas Madrid, MD 09/29/2019

## 2019-10-01 LAB — CULTURE, URINE COMPREHENSIVE

## 2019-10-02 ENCOUNTER — Other Ambulatory Visit: Payer: Self-pay | Admitting: Physician Assistant

## 2019-10-02 DIAGNOSIS — R3914 Feeling of incomplete bladder emptying: Secondary | ICD-10-CM

## 2019-10-02 DIAGNOSIS — H25813 Combined forms of age-related cataract, bilateral: Secondary | ICD-10-CM | POA: Diagnosis not present

## 2019-10-06 ENCOUNTER — Encounter: Payer: Self-pay | Admitting: Urology

## 2019-10-06 ENCOUNTER — Encounter
Admission: RE | Admit: 2019-10-06 | Discharge: 2019-10-06 | Disposition: A | Discharge status: Home / Self Care | Payer: Medicare HMO | Source: Ambulatory Visit | Attending: Urology | Admitting: Urology

## 2019-10-06 NOTE — Patient Instructions (Addendum)
Your procedure is scheduled on: 10-17-19 FRIDAY Report to Same Day Surgery 2nd floor medical mall [facility] Entrance-take elevator on left to 2nd floor.  Check in with surgery information desk.) To find out your arrival time please call [phone] between 1PM - 3PM on 5-27-21THURSDAY  Remember: Instructions that are not followed completely may result in serious medical risk, up to and including death, or upon the discretion of your surgeon and anesthesiologist your surgery may need to be rescheduled.    _x___ 1. Do not eat food after midnight the night before your procedure. NO GUM OR CANDY AFTER MIDNIGHT. You may drink clear liquids up to 2 hours before you are scheduled to arrive at the hospital for your procedure.  Do not drink clear liquids within 2 hours of your scheduled arrival to the hospital.  Clear liquids include  --Water or Apple juice without pulp  --Gatorade  --Black Coffee or Clear Tea (No milk, no creamers, do not add anything to the coffee or Tea-OK TO ADD SUGAR    ____Ensure clear carbohydrate drink on the way to the hospital for bariatric patients  ____Ensure clear carbohydrate drink 3 hours before surgery.     __x__ 2. No Alcohol for 24 hours before or after surgery.   __x__3. No Smoking or e-cigarettes for 24 prior to surgery.  Do not use any chewable tobacco products for at least 6 hour prior to surgery   ____  4. Bring all medications with you on the day of surgery if instructed.    __x__ 5. Notify your doctor if there is any change in your medical condition     (cold, fever, infections).    x___6. On the morning of surgery brush your teeth with toothpaste and water.  You may rinse your mouth with mouth wash if you wish.  Do not swallow any toothpaste or mouthwash.   Do not wear jewelry, make-up, hairpins, clips or nail polish.  Do not wear lotions, powders, or perfumes. You may wear deodorant.  Do not shave 48 hours prior to surgery. Men may shave face  and neck.  Do not bring valuables to the hospital.    [facility] is not responsible for any belongings or valuables.               Contacts, dentures or bridgework may not be worn into surgery.  Leave your suitcase in the car. After surgery it may be brought to your room.  For patients admitted to the hospital, discharge time is determined by your treatment team.  _  Patients discharged the day of surgery will not be allowed to drive home.  You will need someone to drive you home and stay with you the night of your procedure.    Please read over the following fact sheets that you were given:   [facility] Preparing for Surgery  _x___ TAKE THE FOLLOWING MEDICATION THE MORNING OF SURGERY WITH A SMALL SIP OF WATER. These include:  1. TERAZOSIN (HYTRIN)  2.  3.  4.  5.  6.  ____Fleets enema or Magnesium Citrate as directed.   ____ Use CHG Soap or sage wipes as directed on instruction sheet   ____ Use inhalers on the day of surgery and bring to hospital day of surgery  ____ Stop Metformin and Janumet 2 days prior to surgery.    ____ Take 1/2 of usual insulin dose the night before surgery and none on the morning surgery.   _x___ Follow recommendations  from Cardiologist, Pulmonologist or PCP regarding stopping Aspirin, Coumadin, Plavix ,Eliquis, Effient, or Pradaxa, and Pletal-CALL DR SNINSKY'S OFFICE ABOUT WHEN TO STOP YOUR PLAVIX (CLOPIDOGREL)  X____Stop Anti-inflammatories such as Advil, Aleve, Ibuprofen, Motrin, Naproxen, Naprosyn, Goodies powders or aspirin products 7 DAYS PRIOR TO SURGERY-OK to take Tylenol    _x___ Stop supplements until after surgery-STOP YOUR OCUVITE-LUTEIN 7 DAYS PRIOR TO SURGERY-YOU MAY RESUME AFTER YOUR SURGERY   ____ Bring C-Pap to the hospital.

## 2019-10-06 NOTE — Pre-Procedure Instructions (Signed)
Progress Notes - documented in this encounter Timothy Dibble, MD - 04/11/2019 12:15 PM EST Formatting of this note might be different from the original. Established Patient Visit   Chief Complaint: Chief Complaint  Patient presents with  . Coronary Artery Disease  . Hypertension  Date of Service: 04/11/2019 Date of Birth: 19-Apr-1933 PCP: Timothy Nancy, MD  History of Present Illness: Timothy Bass is a 86 y.o.male patient  Patient returns today for further evaluation and treatment options of coronary disease coronary bypass graft hypertension hyperlipidemia and peripheral vascular disease. The patient has done fairly well with most of the physical activity he wishes to do without evidence of dizziness weakness fatigue syncope nausea or diaphoresis. He does work throughout the house with no symptoms whatsoever. His coronary bypass graft in the past has fared well with no evidence of significant concerns. He has also peripheral vascular disease continuing to treat for his risk factor management. Currently he is on high intensity cholesterol therapy of 80 mg of atorvastatin with a current LDL cholesterol of 30 HDL of 40. Additionally there is antihypertensive medication management with good blood pressure control at home and here. The patient has previously been on isosorbide but with discontinuation at last visit there has been no recurrence of chest pain. Antiplatelet medication management is continued with no evidence of bleeding or bruising episodes.  Past Medical and Surgical History  Past Medical History Past Medical History:  Diagnosis Date  . CAD (coronary artery disease)  . Hyperglycemia  . Hyperlipidemia  . Hypertension  . Prostatic hypertrophy  . Reflux esophagitis 05/27/2015   Past Surgical History He has a past surgical history that includes Coronary artery bypass graft (1995); Vasectomy; egd (05/27/2015); and Colonoscopy (07/02/2017).   Medications and Allergies    Current Medications  Current Outpatient Medications on File Prior to Visit  Medication Sig Dispense Refill  . amLODIPine-benazepril (LOTREL) 5-10 mg capsule TAKE 1 CAPSULE BY MOUTH EVERY DAY 90 capsule 1  . atorvastatin (LIPITOR) 80 MG tablet TAKE 1 TABLET BY MOUTH EVERY DAY 30 tablet 3  . CALCIUM CARBONATE (CALCIUM 600 ORAL) Take 1 tablet by mouth once daily.  . clopidogreL (PLAVIX) 75 mg tablet TAKE 1 TABLET (75 MG TOTAL) BY MOUTH DAILY WITH BREAKFAST. (NO FURTHER REFILLS UNTIL DR VISIT) 90 tablet 1  . finasteride (PROSCAR) 5 mg tablet Take 1 tablet by mouth once daily.  Marland Kitchen inulin/sorbitol (FIBER CHOICE ORAL) Take 1 capsule by mouth once daily  . lactobacillus combination no.9 (ADULT 50 PLUS PROBIOTIC ORAL) Take 1 capsule by mouth once daily  . oxybutynin (DITROPAN-XL) 10 MG XL tablet TAKE 1 TABLET BY MOUTH EVERYDAY AT BEDTIME 3  . terazosin (HYTRIN) 2 MG capsule Take 1 capsule (2 mg total) by mouth nightly 90 capsule 1   No current facility-administered medications on file prior to visit.   Allergies: Aspirin and Peanut  Social and Family History  Social History reports that he quit smoking about 25 years ago. His smoking use included cigarettes. He has never used smokeless tobacco. He reports that he does not drink alcohol or use drugs.  Family History Family History  Problem Relation Age of Onset  . Colon cancer Neg Hx  . Colon polyps Neg Hx  . High blood pressure (Hypertension) Neg Hx  . Diabetes type II Neg Hx  . Stroke Neg Hx   Review of Systems   Review of Systems  Positive for none Negative for weight gain weight loss, weakness, vision change, hearing loss,  cough, congestion, PND, orthopnea, heartburn, nausea, diaphoresis, vomiting, diarrhea, bloody stool, melena, stomach pain, extremity pain, leg weakness, leg cramping, leg blood clots, headache, blackouts, nosebleed, trouble swallowing, mouth pain, urinary frequency, urination at night, muscle weakness, skin lesions,  skin rashes, tingling ,ulcers, numbness, anxiety, and/or depression Physical Examination   Vitals:BP 130/64  Pulse 75  Ht 167.6 cm (5\' 6" )  Wt 78.5 kg (173 lb)  SpO2 95%  BMI 27.92 kg/m  Ht:167.6 cm (5\' 6" ) Wt:78.5 kg (173 lb) [id number] surface area is 1.91 meters squared. Body mass index is 27.92 kg/m. Appearance: well appearing in no acute distress HEENT: Pupils equally reactive to light and accomodation, no xanthalasma  Neck: Supple, no apparent thyromegaly, masses, or lymphadenopathy  Lungs: normal respiratory effort; no crackles, no rhonchi, no wheezes Heart: Regular rate and rhythm. Normal S1 S2 No gallops, murmur, no rub, PMI is normal size and placement. carotid upstroke normal without bruit. Jugular venous pressure is normal Abdomen: soft, nontender, not distended with normal bowel sounds. No apparent hepatosplenomegally. Abdominal aorta is normal size without bruit Extremities: no edema, no ulcers, no clubbing, no cyanosis Peripheral Pulses: 2+ in upper extremities, 2+ femoral pulses bilaterally, 2+lower extremity  Musculoskeletal; Normal muscle tone without kyphosis Neurological: Oriented and Alert, Cranial nerves intact  Assessment   86 y.o. male with  Encounter Diagnoses  Name Primary?  . Coronary artery disease involving autologous vein coronary bypass graft without angina pectoris Yes  . Atherosclerosis of native coronary artery of native heart without angina pectoris  . Familial hypercholesterolemia  . Essential hypertension   Plan  -The patient will continue current medical regimen for further risk reduction of coronary artery bypass graft stenoses and/or future complication. The patient has been instructed on this medication management as well as other risk factor reduction therapy. This includes a diet and exercise program. They will continue to report any new symptoms suggestive of stenosis of coronary artery bypass grafts. -Continue current medical regimen of  anti-platelet medication and other risk factor management for peripheral vascular disease and stroke risk reduction without change today which appears to be stable. The patient understands future goals of treatment. -The patient has been instructed on appropriate methods of cardiovascular risk reduction using medication management for lipid lowering. This includes a continued effort to lower LDL cholesterol between 30-50%. We are watching for any significant side effects of medication management. We have also continued to promote diet, exercise, and lifestyle measures to help as well. -There has been a review of medication management for hypertension control and of the current medical regimen used. The patient understands all the risks and benefits of hypertension control with these medications to reduce possible future cardiovascular complications and risk of disease. There will be no changes in medication regimen necessary today. -Further consideration of functional evaluation from cardiovascular standpoint of further symptoms in the future or next visit including ETT and/or echocardiogram  No orders of the defined types were placed in this encounter.  Return in about 6 months (around 10/09/2019).  Timothy Dibble, MD    Electronically signed by Timothy Dibble, MD at 04/11/2019 3:42 PM EST   Plan of Treatment - documented as of this encounter Upcoming Encounters Upcoming Encounters  Date Type Specialty Care Team Description  10/15/2019 Office Visit Cardiology Timothy Dibble, MD  [address]  [facility]  [place], [place] [address]  [phone]  [phone] ([address])    [place], [place], [place]  [address]  [phone]  [phone] (Fax)     Visit Diagnoses - documented in this encounter Diagnosis  Coronary artery disease involving autologous vein coronary bypass graft without angina pectoris  - Primary   Atherosclerosis of native coronary artery of native heart without angina pectoris   Familial hypercholesterolemia   Essential hypertension    Discontinued Medications - documented as of this encounter Medication Sig Discontinue Reason Start Date End Date  isosorbide mononitrate (IMDUR) 30 MG ER tablet  Take 30 mg by mouth once daily   04/11/2019  Images  Patient Contacts  Contact Name Contact Address Communication Relationship to Patient  Shoaib Bargeron Unknown [id number] [facility]) Son or Daughter, Emergency Contact  Document Information  Primary Care Provider Other Service Providers Document Coverage Dates  Timothy Nancy, MD (Apr. 15, 2015April 15, 2015 - Present) [phone] (Work) [phone] (Fax)  [place], [place] [address]  Nov. 20, 2020November 20, 2020   [place] [address] [place], [place] [address]   Encounter Providers Encounter Date  Timothy Dibble, MD (Attending) [phone] (Work) [phone] (Fax) [facility] [place], [place] [address] Cardiovascular Disease Nov. 20, 2020November 20, 2020

## 2019-10-06 NOTE — Pre-Procedure Instructions (Signed)
ECG 12-lead2/13/2020 [place] Component Name Value Ref Range  Vent Rate (bpm) 64   PR Interval (msec) 236   QRS Interval (msec) 148   QT Interval (msec) 470   QTc (msec) 484   Other Result Information  This result has an attachment that is not available.  Result Narrative  Sinus rhythm 1st degree AV block Right bundle branch block Abnormal ECG When compared with ECG of 17-Apr-2017 17:29, No significant change was found I reviewed and concur with this report. Electronically signed [id number] MD, Darnell Level [phone]) on 07/23/2018 1:33:41 PM  Status Results Details   Encounter Summary

## 2019-10-07 ENCOUNTER — Other Ambulatory Visit: Payer: Self-pay

## 2019-10-07 ENCOUNTER — Encounter
Admission: RE | Admit: 2019-10-07 | Discharge: 2019-10-07 | Disposition: A | Discharge status: Home / Self Care | Payer: Medicare HMO | Source: Ambulatory Visit | Attending: Urology | Admitting: Urology

## 2019-10-07 ENCOUNTER — Other Ambulatory Visit: Payer: Self-pay | Admitting: Urology

## 2019-10-07 DIAGNOSIS — I440 Atrioventricular block, first degree: Secondary | ICD-10-CM | POA: Diagnosis not present

## 2019-10-07 DIAGNOSIS — I10 Essential (primary) hypertension: Secondary | ICD-10-CM | POA: Insufficient documentation

## 2019-10-07 DIAGNOSIS — R9431 Abnormal electrocardiogram [ECG] [EKG]: Secondary | ICD-10-CM | POA: Insufficient documentation

## 2019-10-07 DIAGNOSIS — Z951 Presence of aortocoronary bypass graft: Secondary | ICD-10-CM | POA: Insufficient documentation

## 2019-10-07 DIAGNOSIS — R3914 Feeling of incomplete bladder emptying: Secondary | ICD-10-CM

## 2019-10-07 DIAGNOSIS — I4510 Unspecified right bundle-branch block: Secondary | ICD-10-CM | POA: Diagnosis not present

## 2019-10-09 DIAGNOSIS — I2510 Atherosclerotic heart disease of native coronary artery without angina pectoris: Secondary | ICD-10-CM | POA: Diagnosis not present

## 2019-10-09 DIAGNOSIS — E7801 Familial hypercholesterolemia: Secondary | ICD-10-CM | POA: Diagnosis not present

## 2019-10-09 DIAGNOSIS — I10 Essential (primary) hypertension: Secondary | ICD-10-CM | POA: Diagnosis not present

## 2019-10-15 ENCOUNTER — Other Ambulatory Visit
Admission: RE | Admit: 2019-10-15 | Discharge: 2019-10-15 | Disposition: A | Discharge status: Home / Self Care | Payer: Medicare HMO | Source: Ambulatory Visit | Attending: Urology | Admitting: Urology

## 2019-10-15 ENCOUNTER — Other Ambulatory Visit: Payer: Self-pay

## 2019-10-15 DIAGNOSIS — Z20822 Contact with and (suspected) exposure to covid-19: Secondary | ICD-10-CM | POA: Diagnosis not present

## 2019-10-15 DIAGNOSIS — Z01812 Encounter for preprocedural laboratory examination: Secondary | ICD-10-CM | POA: Diagnosis not present

## 2019-10-15 LAB — SARS CORONAVIRUS 2 (TAT 6-24 HRS): SARS Coronavirus 2: NEGATIVE

## 2019-10-15 NOTE — Progress Notes (Signed)
No show for covid test today, Dr. Diamantina Providence office notified

## 2019-10-16 MED ORDER — CIPROFLOXACIN IN D5W 400 MG/200ML IV SOLN
400.0000 mg | INTRAVENOUS | Status: AC
  Administered 2019-10-17: 400 mg via INTRAVENOUS

## 2019-10-17 ENCOUNTER — Emergency Department
Admission: EM | Admit: 2019-10-17 | Discharge: 2019-10-17 | Disposition: A | Discharge status: Home / Self Care | Payer: Medicare HMO | Source: Home / Self Care | Attending: Student in an Organized Health Care Education/Training Program | Admitting: Student in an Organized Health Care Education/Training Program

## 2019-10-17 ENCOUNTER — Ambulatory Visit
Admission: RE | Admit: 2019-10-17 | Discharge: 2019-10-17 | Disposition: A | Discharge status: Home / Self Care | Payer: Medicare HMO | Attending: Urology | Admitting: Urology

## 2019-10-17 ENCOUNTER — Encounter: Payer: Self-pay | Admitting: Emergency Medicine

## 2019-10-17 ENCOUNTER — Encounter
Admission: RE | Disposition: A | Discharge status: Home / Self Care | Payer: Self-pay | Source: Home / Self Care | Attending: Urology

## 2019-10-17 ENCOUNTER — Encounter: Payer: Self-pay | Admitting: Urology

## 2019-10-17 ENCOUNTER — Ambulatory Visit: Payer: Medicare HMO | Admitting: Family

## 2019-10-17 ENCOUNTER — Other Ambulatory Visit: Payer: Self-pay

## 2019-10-17 DIAGNOSIS — N401 Enlarged prostate with lower urinary tract symptoms: Secondary | ICD-10-CM | POA: Insufficient documentation

## 2019-10-17 DIAGNOSIS — I252 Old myocardial infarction: Secondary | ICD-10-CM | POA: Diagnosis not present

## 2019-10-17 DIAGNOSIS — R300 Dysuria: Secondary | ICD-10-CM | POA: Diagnosis not present

## 2019-10-17 DIAGNOSIS — R350 Frequency of micturition: Secondary | ICD-10-CM | POA: Diagnosis not present

## 2019-10-17 DIAGNOSIS — R32 Unspecified urinary incontinence: Secondary | ICD-10-CM | POA: Diagnosis not present

## 2019-10-17 DIAGNOSIS — N3289 Other specified disorders of bladder: Secondary | ICD-10-CM | POA: Diagnosis not present

## 2019-10-17 DIAGNOSIS — Z87440 Personal history of urinary (tract) infections: Secondary | ICD-10-CM | POA: Insufficient documentation

## 2019-10-17 DIAGNOSIS — Z955 Presence of coronary angioplasty implant and graft: Secondary | ICD-10-CM | POA: Diagnosis not present

## 2019-10-17 DIAGNOSIS — R319 Hematuria, unspecified: Secondary | ICD-10-CM | POA: Diagnosis not present

## 2019-10-17 DIAGNOSIS — R3912 Poor urinary stream: Secondary | ICD-10-CM | POA: Insufficient documentation

## 2019-10-17 DIAGNOSIS — R0902 Hypoxemia: Secondary | ICD-10-CM | POA: Diagnosis not present

## 2019-10-17 DIAGNOSIS — R3914 Feeling of incomplete bladder emptying: Secondary | ICD-10-CM | POA: Diagnosis not present

## 2019-10-17 DIAGNOSIS — K219 Gastro-esophageal reflux disease without esophagitis: Secondary | ICD-10-CM | POA: Diagnosis not present

## 2019-10-17 DIAGNOSIS — I10 Essential (primary) hypertension: Secondary | ICD-10-CM | POA: Insufficient documentation

## 2019-10-17 DIAGNOSIS — Z87891 Personal history of nicotine dependence: Secondary | ICD-10-CM | POA: Diagnosis not present

## 2019-10-17 DIAGNOSIS — Z951 Presence of aortocoronary bypass graft: Secondary | ICD-10-CM | POA: Insufficient documentation

## 2019-10-17 DIAGNOSIS — E785 Hyperlipidemia, unspecified: Secondary | ICD-10-CM | POA: Insufficient documentation

## 2019-10-17 DIAGNOSIS — R58 Hemorrhage, not elsewhere classified: Secondary | ICD-10-CM | POA: Diagnosis not present

## 2019-10-17 DIAGNOSIS — R52 Pain, unspecified: Secondary | ICD-10-CM | POA: Diagnosis not present

## 2019-10-17 DIAGNOSIS — R3915 Urgency of urination: Secondary | ICD-10-CM | POA: Insufficient documentation

## 2019-10-17 DIAGNOSIS — I2510 Atherosclerotic heart disease of native coronary artery without angina pectoris: Secondary | ICD-10-CM | POA: Insufficient documentation

## 2019-10-17 DIAGNOSIS — R1084 Generalized abdominal pain: Secondary | ICD-10-CM | POA: Diagnosis not present

## 2019-10-17 DIAGNOSIS — Z7902 Long term (current) use of antithrombotics/antiplatelets: Secondary | ICD-10-CM | POA: Diagnosis not present

## 2019-10-17 DIAGNOSIS — R351 Nocturia: Secondary | ICD-10-CM | POA: Diagnosis not present

## 2019-10-17 DIAGNOSIS — T839XXA Unspecified complication of genitourinary prosthetic device, implant and graft, initial encounter: Secondary | ICD-10-CM

## 2019-10-17 DIAGNOSIS — T83098A Other mechanical complication of other indwelling urethral catheter, initial encounter: Secondary | ICD-10-CM | POA: Diagnosis not present

## 2019-10-17 DIAGNOSIS — R609 Edema, unspecified: Secondary | ICD-10-CM | POA: Diagnosis not present

## 2019-10-17 HISTORY — PX: HOLEP-LASER ENUCLEATION OF THE PROSTATE WITH MORCELLATION: SHX6641

## 2019-10-17 HISTORY — DX: Unspecified osteoarthritis, unspecified site: M19.90

## 2019-10-17 SURGERY — ENUCLEATION, PROSTATE, USING LASER, WITH MORCELLATION
Anesthesia: General | Site: Prostate

## 2019-10-17 MED ORDER — LIDOCAINE HCL (CARDIAC) PF 100 MG/5ML IV SOSY
PREFILLED_SYRINGE | INTRAVENOUS | Status: DC | PRN
  Administered 2019-10-17: 60 mg via INTRAVENOUS

## 2019-10-17 MED ORDER — DEXAMETHASONE SODIUM PHOSPHATE 10 MG/ML IJ SOLN
INTRAMUSCULAR | Status: DC | PRN
  Administered 2019-10-17: 10 mg via INTRAVENOUS

## 2019-10-17 MED ORDER — ONDANSETRON HCL 4 MG/2ML IJ SOLN
INTRAMUSCULAR | Status: DC | PRN
  Administered 2019-10-17: 4 mg via INTRAVENOUS

## 2019-10-17 MED ORDER — CIPROFLOXACIN IN D5W 400 MG/200ML IV SOLN
INTRAVENOUS | Status: AC
  Filled 2019-10-17: qty 200

## 2019-10-17 MED ORDER — LACTATED RINGERS IV SOLN: INTRAVENOUS | Status: DC

## 2019-10-17 MED ORDER — FENTANYL CITRATE (PF) 100 MCG/2ML IJ SOLN
25.0000 ug | INTRAMUSCULAR | Status: DC | PRN
  Administered 2019-10-17: 25 ug via INTRAVENOUS

## 2019-10-17 MED ORDER — FENTANYL CITRATE (PF) 100 MCG/2ML IJ SOLN
INTRAMUSCULAR | Status: DC | PRN
  Administered 2019-10-17: 25 ug via INTRAVENOUS
  Administered 2019-10-17 (×2): 50 ug via INTRAVENOUS

## 2019-10-17 MED ORDER — FAMOTIDINE 20 MG PO TABS
ORAL_TABLET | ORAL | Status: AC
  Filled 2019-10-17: qty 1

## 2019-10-17 MED ORDER — SUGAMMADEX SODIUM 200 MG/2ML IV SOLN
INTRAVENOUS | Status: DC | PRN
  Administered 2019-10-17: 50 mg via INTRAVENOUS

## 2019-10-17 MED ORDER — DEXAMETHASONE SODIUM PHOSPHATE 10 MG/ML IJ SOLN
INTRAMUSCULAR | Status: AC
  Filled 2019-10-17: qty 1

## 2019-10-17 MED ORDER — BELLADONNA ALKALOIDS-OPIUM 16.2-60 MG RE SUPP
RECTAL | Status: AC
  Filled 2019-10-17: qty 1

## 2019-10-17 MED ORDER — ROCURONIUM BROMIDE 10 MG/ML (PF) SYRINGE
PREFILLED_SYRINGE | INTRAVENOUS | Status: AC
  Filled 2019-10-17: qty 10

## 2019-10-17 MED ORDER — ONDANSETRON HCL 4 MG/2ML IJ SOLN: 4.0000 mg | Freq: Once | INTRAMUSCULAR | Status: DC | PRN

## 2019-10-17 MED ORDER — FAMOTIDINE 20 MG PO TABS
20.0000 mg | ORAL_TABLET | Freq: Once | ORAL | Status: AC
  Administered 2019-10-17: 20 mg via ORAL

## 2019-10-17 MED ORDER — SULFAMETHOXAZOLE-TRIMETHOPRIM 800-160 MG PO TABS
1.0000 | ORAL_TABLET | Freq: Every day | ORAL | 0 refills | Status: DC
Start: 2019-10-17 — End: 2019-12-11

## 2019-10-17 MED ORDER — BELLADONNA ALKALOIDS-OPIUM 16.2-60 MG RE SUPP
RECTAL | Status: DC | PRN
  Administered 2019-10-17: 1 via RECTAL

## 2019-10-17 MED ORDER — ROCURONIUM BROMIDE 100 MG/10ML IV SOLN
INTRAVENOUS | Status: DC | PRN
  Administered 2019-10-17: 50 mg via INTRAVENOUS

## 2019-10-17 MED ORDER — FENTANYL CITRATE (PF) 100 MCG/2ML IJ SOLN
INTRAMUSCULAR | Status: AC
  Filled 2019-10-17: qty 2

## 2019-10-17 MED ORDER — PROPOFOL 10 MG/ML IV BOLUS
INTRAVENOUS | Status: DC | PRN
  Administered 2019-10-17: 110 mg via INTRAVENOUS

## 2019-10-17 MED ORDER — FENTANYL CITRATE (PF) 100 MCG/2ML IJ SOLN
INTRAMUSCULAR | Status: AC
  Administered 2019-10-17: 25 ug via INTRAVENOUS
  Filled 2019-10-17: qty 2

## 2019-10-17 MED ORDER — CHLORHEXIDINE GLUCONATE 0.12 % MT SOLN: 15.0000 mL | Freq: Once | OROMUCOSAL | Status: AC

## 2019-10-17 MED ORDER — LIDOCAINE HCL (PF) 2 % IJ SOLN
INTRAMUSCULAR | Status: AC
  Filled 2019-10-17: qty 5

## 2019-10-17 MED ORDER — EPHEDRINE SULFATE 50 MG/ML IJ SOLN
INTRAMUSCULAR | Status: DC | PRN
  Administered 2019-10-17: 5 mg via INTRAVENOUS

## 2019-10-17 MED ORDER — PROPOFOL 10 MG/ML IV BOLUS
INTRAVENOUS | Status: AC
  Filled 2019-10-17: qty 40

## 2019-10-17 MED ORDER — CHLORHEXIDINE GLUCONATE 0.12 % MT SOLN
OROMUCOSAL | Status: AC
  Administered 2019-10-17: 15 mL via OROMUCOSAL
  Filled 2019-10-17: qty 15

## 2019-10-17 MED ORDER — PHENYLEPHRINE HCL (PRESSORS) 10 MG/ML IV SOLN
INTRAVENOUS | Status: DC | PRN
  Administered 2019-10-17 (×4): 100 ug via INTRAVENOUS

## 2019-10-17 SURGICAL SUPPLY — 34 items
ADAPTER IRRIG TUBE 2 SPIKE SOL (ADAPTER) ×6 IMPLANT
BAG URO DRAIN 4000ML (MISCELLANEOUS) ×3 IMPLANT
CATH FOLEY 3WAY 30CC 24FR (CATHETERS) ×3
CATH URETL 5X70 OPEN END (CATHETERS) ×3 IMPLANT
CATH URTH STD 24FR FL 3W 2 (CATHETERS) ×1 IMPLANT
CONTAINER COLLECT MORCELLATR (MISCELLANEOUS) ×1 IMPLANT
DRAPE UTILITY 15X26 TOWEL STRL (DRAPES) IMPLANT
ELECT BIVAP BIPO 22/24 DONUT (ELECTROSURGICAL)
ELECTRD BIVAP BIPO 22/24 DONUT (ELECTROSURGICAL) IMPLANT
FILTER OVERFLOW MORCELLATOR (FILTER) ×1 IMPLANT
GLOVE BIOGEL PI IND STRL 7.5 (GLOVE) ×1 IMPLANT
GLOVE BIOGEL PI INDICATOR 7.5 (GLOVE) ×2
GOWN STRL REUS W/ TWL LRG LVL3 (GOWN DISPOSABLE) ×1 IMPLANT
GOWN STRL REUS W/ TWL XL LVL3 (GOWN DISPOSABLE) ×1 IMPLANT
GOWN STRL REUS W/TWL LRG LVL3 (GOWN DISPOSABLE) ×3
GOWN STRL REUS W/TWL XL LVL3 (GOWN DISPOSABLE) ×3
HOLDER FOLEY CATH W/STRAP (MISCELLANEOUS) ×3 IMPLANT
KIT TURNOVER CYSTO (KITS) ×3 IMPLANT
LASER FIBER 550M SMARTSCOPE (Laser) ×3 IMPLANT
MBRN O SEALING YLW 17 FOR INST (MISCELLANEOUS) ×3
MEMBRANE SLNG YLW 17 FOR INST (MISCELLANEOUS) ×1 IMPLANT
MORCELLATOR COLLECT CONTAINER (MISCELLANEOUS) ×3
MORCELLATOR OVERFLOW FILTER (FILTER) ×3
MORCELLATOR ROTATION 4.75 335 (MISCELLANEOUS) ×3 IMPLANT
PACK CYSTO AR (MISCELLANEOUS) ×3 IMPLANT
SET CYSTO W/LG BORE CLAMP LF (SET/KITS/TRAYS/PACK) ×3 IMPLANT
SET IRRIG Y TYPE TUR BLADDER L (SET/KITS/TRAYS/PACK) ×3 IMPLANT
SLEEVE PROTECTION STRL DISP (MISCELLANEOUS) ×6 IMPLANT
SOL .9 NS 3000ML IRR  AL (IV SOLUTION) ×24
SOL .9 NS 3000ML IRR UROMATIC (IV SOLUTION) ×8 IMPLANT
SURGILUBE 2OZ TUBE FLIPTOP (MISCELLANEOUS) ×3 IMPLANT
SYRINGE IRR TOOMEY STRL 70CC (SYRINGE) ×3 IMPLANT
TUBE PUMP MORCELLATOR PIRANHA (TUBING) ×3 IMPLANT
WATER STERILE IRR 1000ML POUR (IV SOLUTION) ×3 IMPLANT

## 2019-10-17 NOTE — Anesthesia Preprocedure Evaluation (Signed)
Anesthesia Evaluation  Patient identified by MRN, date of birth, ID band Patient awake    Reviewed: Allergy & Precautions, H&P , NPO status , Patient's Chart, lab work & pertinent test results, reviewed documented beta blocker date and time   History of Anesthesia Complications Negative for: history of anesthetic complications  Airway Mallampati: II  TM Distance: >3 FB Neck ROM: full    Dental no notable dental hx. (+) Edentulous Upper, Edentulous Lower, Dental Advidsory Given   Pulmonary neg shortness of breath, neg sleep apnea, neg COPD, neg recent URI, former smoker,    Pulmonary exam normal breath sounds clear to auscultation       Cardiovascular Exercise Tolerance: Good hypertension, (-) angina+ CAD, + Past MI and + CABG (in 1994)  (-) Cardiac Stents negative cardio ROS Normal cardiovascular exam(-) dysrhythmias (-) Valvular Problems/Murmurs Rhythm:regular Rate:Normal     Neuro/Psych negative neurological ROS  negative psych ROS   GI/Hepatic Neg liver ROS, GERD  Medicated and Controlled,  Endo/Other  negative endocrine ROS  Renal/GU negative Renal ROS  negative genitourinary   Musculoskeletal   Abdominal   Peds  Hematology negative hematology ROS (+)   Anesthesia Other Findings Past Medical History:   Hyperlipemia                                                 BPH without obstruction/lower urinary tract sy*              Hypertension                                                 Dysuria                                                      Nocturia                                                     Urinary incontinence                                         Urinary frequency                                            Coronary artery disease                                      Hyperglycemia  Enlarged prostate                                           Prostatic hypertrophy                                        Reproductive/Obstetrics negative OB ROS                             Anesthesia Physical  Anesthesia Plan  ASA: III  Anesthesia Plan: General   Post-op Pain Management:    Induction: Intravenous  PONV Risk Score and Plan: Ondansetron, Dexamethasone and Treatment may vary due to age or medical condition  Airway Management Planned: LMA and Oral ETT  Additional Equipment:   Intra-op Plan:   Post-operative Plan:   Informed Consent: I have reviewed the patients History and Physical, chart, labs and discussed the procedure including the risks, benefits and alternatives for the proposed anesthesia with the patient or authorized representative who has indicated his/her understanding and acceptance.     Dental Advisory Given  Plan Discussed with: Anesthesiologist, CRNA and Surgeon  Anesthesia Plan Comments:         Anesthesia Quick Evaluation

## 2019-10-17 NOTE — ED Provider Notes (Signed)
[facility] Emergency Department Provider Note  ____________________________________________  Time seen: Approximately 11:11 PM  I have reviewed the triage vital signs and the nursing notes.   HISTORY  Chief Complaint Hematuria and Post-op Problem   HPI Timothy Bass is a 86 y.o. male presenting to the emergency department for Foley catheter care after having a H0 LEEP procedure earlier today.  Foley was placed after the procedure and he is now experiencing hematuria and decreased urination.  He is also having some penile pain.  Dr. Jeb Levering is aware and plans to come see the patient.  Past Medical History:  Diagnosis Date  . Arthritis   . BPH without obstruction/lower urinary tract symptoms   . Coronary artery disease   . Dysuria   . Enlarged prostate   . GERD (gastroesophageal reflux disease)   . Hypercholesteremia   . Hyperglycemia   . Hyperlipemia   . Hypertension   . Myocardial infarction ([place]) 1995  . Nocturia   . Prostatic hypertrophy   . Urinary frequency   . Urinary incontinence     Patient Active Problem List   Diagnosis Date Noted  . CAD (coronary artery disease) 01/05/2016  . Unstable angina ([place]) 12/30/2015  . Encounter for screening for malignant neoplasm of colon 04/06/2014  . Arteriosclerosis of coronary artery 02/25/2014  . Blood glucose elevated 02/25/2014  . HLD (hyperlipidemia) 02/25/2014  . BP (high blood pressure) 02/25/2014  . Benign fibroma of prostate 02/25/2014  . Incomplete bladder emptying 02/17/2013  . Benign prostatic hyperplasia with urinary obstruction 06/14/2012  . Excessive urination at night 06/14/2012  . FOM (frequency of micturition) 06/14/2012    Past Surgical History:  Procedure Laterality Date  . CARDIAC CATHETERIZATION N/A 01/05/2016   Procedure: LEFT HEART CATH AND CORS/GRAFTS ANGIOGRAPHY;  Surgeon: Corey Skains, MD;  Location: [place] [facility];  Service: Cardiovascular;  Laterality: N/A;  .  CARDIAC CATHETERIZATION N/A 01/05/2016   Procedure: Coronary Stent Intervention;  Surgeon: Isaias Cowman, MD;  Location: [place] [facility];  Service: Cardiovascular;  Laterality: N/A;  . cardiac stents  2017  . COLONOSCOPY WITH PROPOFOL N/A 07/02/2017   Procedure: COLONOSCOPY WITH PROPOFOL;  Surgeon: Lollie Sails, MD;  Location[facility] ENDOSCOPY;  Service: Endoscopy;  Laterality: N/A;  . CORONARY ARTERY BYPASS GRAFT     x2  . ESOPHAGOGASTRODUODENOSCOPY (EGD) WITH PROPOFOL N/A 05/27/2015   Procedure: ESOPHAGOGASTRODUODENOSCOPY (EGD) WITH PROPOFOL;  Surgeon: Hulen Luster, MD;  Location[facility] ENDOSCOPY;  Service: Gastroenterology;  Laterality: N/A;  . VASECTOMY      Prior to Admission medications   Medication Sig Start Date End Date Taking? Authorizing Provider  acidophilus (RISAQUAD) CAPS capsule Take 1 capsule by mouth daily.    [provider]  amLODipine-benazepril (LOTREL) 5-10 MG capsule Take 1 capsule by mouth every morning.  07/20/15   [provider]  atorvastatin (LIPITOR) 80 MG tablet Take 1 tablet (80 mg total) by mouth daily. Patient taking differently: Take 80 mg by mouth at bedtime.  01/06/16   Corey Skains, MD  bisacodyl (DULCOLAX) 5 MG EC tablet Take 5 mg by mouth daily as needed for moderate constipation.    [provider]  cholecalciferol (VITAMIN D3) 25 MCG (1000 UNIT) tablet Take 1,000 Units by mouth daily.    [provider]  clopidogrel (PLAVIX) 75 MG tablet Take 1 tablet (75 mg total) by mouth daily with breakfast. 01/06/16   Corey Skains, MD  Hydrocortisone, Perianal, 1 % CREA  Place 1 application rectally daily.  08/26/19   [provider]  Multiple Vitamin (MULTI-VITAMINS) TABS Take 1 tablet by mouth daily.     [provider]  multivitamin-lutein (OCUVITE-LUTEIN) CAPS capsule Take 1 capsule by mouth daily.    [provider]  polyethylene glycol (MIRALAX / GLYCOLAX) 17 g packet Take 17 g by  mouth daily as needed.    [provider]  senna (SENOKOT) 8.6 MG TABS tablet Take 1 tablet by mouth daily as needed for mild constipation.    [provider]  sulfamethoxazole-trimethoprim (BACTRIM DS) 800-160 MG tablet Take 1 tablet by mouth daily. 10/17/19   Billey Co, MD    Allergies Peanuts [peanut oil]  Family History  Family history unknown: Yes    Social History Social History   Tobacco Use  . Smoking status: Former Smoker    Packs/day: 1.50    Years: 20.00    Pack years: 30.00    Types: Cigarettes    Quit date: 05/22/1993    Years since quitting: 26.4  . Smokeless tobacco: Never Used  Substance Use Topics  . Alcohol use: No    Alcohol/week: 0.0 standard drinks  . Drug use: No    Review of Systems Constitutional: Negative for fever. ENT: Negative for sore throat. Respiratory: No cough Gastrointestinal: No abdominal pain.  No nausea, no vomiting.  No diarrhea.  Positive for decreased urine output.  Positive for hematuria. Musculoskeletal: Negative for generalized body aches. Skin: Negative for rash/lesion/wound. Neurological: Negative for headaches, focal weakness or numbness.  ____________________________________________   PHYSICAL EXAM:  VITAL SIGNS: ED Triage Vitals [10/17/19 1642]  Enc Vitals Group     BP 123/78     Pulse Rate (!) 111     Resp 18     Temp 97.9 F (36.6 C)     Temp Source Oral     SpO2 94 %     Weight 174 lb 2.6 oz (79 kg)     Height 5\' 7"  (1.702 m)     Head Circumference      Peak Flow      Pain Score 10     Pain Loc      Pain Edu?      Excl. in [place]?     Constitutional: Alert and oriented. Well appearing and in no acute distress. Eyes: Conjunctivae are normal. PERRL. EOMI. Head: Atraumatic. Nose: No congestion/rhinnorhea. Mouth/Throat: Mucous membranes are moist. Neck: No stridor.  Cardiovascular: Normal rate, regular rhythm. Good peripheral circulation. Respiratory: Normal respiratory  effort. Musculoskeletal: Full ROM throughout.  Neurologic:  Normal speech and language. No gross focal neurologic deficits are appreciated. Speech is normal. No gait instability. Skin:  Skin is warm, dry and intact. No rash noted. Psychiatric: Mood and affect are normal. Speech and behavior are normal.  ____________________________________________   LABS (all labs ordered are listed, but only abnormal results are displayed)  Labs Reviewed - No data to display ____________________________________________  EKG  Not indicated ____________________________________________  RADIOLOGY  Not indicated ____________________________________________   PROCEDURES  Performed by urology specialist ____________________________________________   INITIAL IMPRESSION / ASSESSMENT AND PLAN / ED COURSE    Dr. Jeb Levering in with the patient and son.  The catheter was too taut which was causing the penile pain.  Catheter was irrigated and supplies were given to the son in case the problem reoccurs.  Patient was advised to keep his scheduled follow-up appointment.  He is to return to the emergency department for concerns.  Pertinent labs & imaging results that were available during my care of the patient were reviewed by me and considered in my medical decision making (see chart for details).  ____________________________________________   FINAL CLINICAL IMPRESSION(S) / ED DIAGNOSES  Final diagnoses:  Foley catheter problem, initial encounter [facility])       Victorino Dike, FNP 10/17/19 2314    Merlyn Lot, MD 10/17/19 [phone]

## 2019-10-17 NOTE — ED Triage Notes (Signed)
Patient presents to Emergency Department via [facility] from homewith complaints of lower abdominal pain, and foley not draining  History of HOLEP procedure this am for enlarged prostrate and pt was in ED at 1630 for same complaint

## 2019-10-17 NOTE — Op Note (Addendum)
Date of procedure: 10/17/19  Preoperative diagnosis:  1. BPH with incomplete bladder emptying  Postoperative diagnosis:  1. Same  Procedure: 1. HoLEP (Holmium Laser Enucleation of the Prostate)  Surgeon: Nickolas Madrid, MD  Anesthesia: General  Complications: None  Intraoperative findings:  1.  Moderate bladder trabeculations, ureteral orifices orthotopic bilaterally, no suspicious bladder lesions 2.  Verumontanum and ureteral orifices intact at conclusion of procedure  EBL: 25 mL  Specimens: Prostate chips   Enucleation time: 60 minutes  Morcellation time: 34 minutes  Pathologic weight: 59g  Drains: 24 French three-way, 60 cc in balloon  Indication: TAYDEN GRABENSTEIN is a 86 y.o. patient with BPH and 160 g prostate with long history of obstructive symptoms, UTIs, and elevated PVRs greater than 250 mL.  After reviewing the management options for treatment, they elected to proceed with the above surgical procedure(s). We have discussed the potential benefits and risks of the procedure, side effects of the proposed treatment, the likelihood of the patient achieving the goals of the procedure, and any potential problems that might occur during the procedure or recuperation.  We specifically discussed the risks of bleeding, infection, hematuria and clot retention, need for additional procedures, possible overnight hospital stay, temporary urgency and incontinence, rare long-term incontinence, and retrograde ejaculation.  Informed consent has been obtained.   Description of procedure:  The patient was taken to the operating room and general anesthesia was induced.  The patient was placed in the dorsal lithotomy position, prepped and draped in the usual sterile fashion, and preoperative antibiotics(Cipro) were administered.  SCDs were placed for DVT prophylaxis.  A preoperative time-out was performed.   The 18 French continuous flow resectoscope was inserted into the urethra using the visual  obturator  The prostate was large with obstructing lateral lobes. The bladder was thoroughly inspected and notable for moderate bladder trabeculations but no suspicious lesions.  The ureteral orifices were located in orthotopic position.  The laser was set to 2 J and 50 Hz and was used to make an incision at the 6 o'clock position to the level of the capsule from the bladder neck to the verumontanum.  The lateral lobes were then incised circumferentially until they were disconnected from the surrounding tissue.  The capsule was examined and laser was used for meticulous hemostasis.    The 65 French resectoscope was then switched out for the 12 French nephroscope and the lobes were morcellated and the tissue sent to pathology.  A 24 French three-way catheter was inserted easily, and CBI was initiated.  60 cc were placed in the balloon.  Urine was light pink.  The catheter irrigated easily with a To[place]y syringe.  A belladonna suppository was placed.  The patient tolerated the procedure well without any immediate complications and was extubated and transferred to the recovery room in stable condition.  Urine was clear on Fast CBI.  Disposition: Stable to PACU  Plan: Wean CBI in PACU, anticipate discharge home today with void trial in clinic in 2-3 days  Nickolas Madrid, MD 10/17/2019

## 2019-10-17 NOTE — Progress Notes (Signed)
Discharge teaching regarding how to empty the catheter drainage bag. Demonstrated the procedure and provided the family with pads (to place on the floor to prevent spillage on the floor) graduated cylinder, urinal and latex gloves. The son verbalized understanding of the education. There where no questions at the present time.Discharge paperwork given to the son.

## 2019-10-17 NOTE — ED Notes (Signed)
No peripheral IV placed this visit.   Discharge instructions reviewed with patient. Questions fielded by this RN. Patient verbalizes understanding of instructions. Patient discharged home in stable condition per robinson. No acute distress noted at time of discharge.   Pt wheeled to car leaving with son  DC conducted with son

## 2019-10-17 NOTE — Transfer of Care (Signed)
Immediate Anesthesia Transfer of Care Note  Patient: Timothy Bass  Procedure(s) Performed: HOLEP-LASER ENUCLEATION OF THE PROSTATE WITH MORCELLATION (N/A Prostate)  Patient Location: PACU  Anesthesia Type:General  Level of Consciousness: awake and drowsy  Airway & Oxygen Therapy: Patient Spontanous Breathing and Patient connected to face mask oxygen  Post-op Assessment: Report given to RN and Post -op Vital signs reviewed and stable  Post vital signs: Reviewed and stable  Last Vitals:  Vitals Value Taken Time  BP 143/76 10/17/19 1003  Temp    Pulse 79 10/17/19 1006  Resp 26 10/17/19 1006  SpO2 100 % 10/17/19 1006  Vitals shown include unvalidated device data.  Last Pain:  Vitals:   10/17/19 0616  TempSrc: Tympanic  PainSc: 0-No pain         Complications: No apparent anesthesia complications

## 2019-10-17 NOTE — Discharge Instructions (Signed)
AMBULATORY SURGERY  °DISCHARGE INSTRUCTIONS ° ° °1) The drugs that you were given will stay in your system until tomorrow so for the next 24 hours you should not: ° °A) Drive an automobile °B) Make any legal decisions °C) Drink any alcoholic beverage ° ° °2) You may resume regular meals tomorrow.  Today it is better to start with liquids and gradually work up to solid foods. ° °You may eat anything you prefer, but it is better to start with liquids, then soup and crackers, and gradually work up to solid foods. ° ° °3) Please notify your doctor immediately if you have any unusual bleeding, trouble breathing, redness and pain at the surgery site, drainage, fever, or pain not relieved by medication. ° ° ° °4) Additional Instructions: ° ° ° ° ° ° ° °Please contact your physician with any problems or Same Day Surgery at [phone], Monday through Friday 6 am to 4 pm, or [place] [facility] [place] Main number at [phone]. °

## 2019-10-17 NOTE — ED Triage Notes (Signed)
Patient comes in after having HOLEP procedure done today by Dr. Diamantina Providence. Patient had foley placed after procedure. Now having gross hematuria and decreased output. Patient's also having pain in penis.

## 2019-10-17 NOTE — H&P (Signed)
UROLOGY H&P UPDATE  Agree with prior H&P dated 09/29/19. 86 year old male with worsening BPH and urinary symptoms despite maximal medical therapy of weak stream, feeling of incomplete emptying, elevated PVRs >252mL, urgency, frequency, and recent UTI.  Cardiac: RRR Lungs: CTA bilaterally  Laterality: N/A Procedure: HoLEP  Urine: urine cx 5/10 no growth  We discussed the risks and benefits of HoLEP at length.  The procedure requires general anesthesia and takes 2 to 3 hours, and a holmium laser is used to enucleate the prostate and push this tissue into the bladder.  A morcellator is then used to remove this tissue, which is sent for pathology.  The vast majority of patients are able to discharge the same day with a catheter in place for 2 to 3 days, and will follow-up in clinic for a voiding trial.  Approximately 5% of patients will be admitted overnight to monitor the urine, or if they have multiple co-morbidities.  We specifically discussed the risks of bleeding, infection, retrograde ejaculation, temporary urgency and urge incontinence, very low risk of long-term incontinence, pathologic evaluation of prostate tissue and possible detection of prostate cancer or other malignancy, and possible need for additional procedures.   Billey Co, MD 10/17/2019

## 2019-10-17 NOTE — ED Provider Notes (Signed)
[facility] Emergency Department Provider Note    First MD Initiated Contact with Patient 10/17/19 2043     (approximate)  I have reviewed the triage vital signs and the nursing notes.   HISTORY  Chief Complaint Post-op Problem    HPI Timothy Bass is a 86 y.o. male presents to the ER for evaluation of hematuria.  Patient just had urological procedure this morning with Foley catheter placement.  Did have some bleeding postop which was manually irrigated and cleared.  Went home and had noticed again red-tinged urine.  He is not complaining of any pain right now.  Foley bag does have blood in the bag but the catheter urine is pink and clearing.  Do not observe any clots.  No evidence of retention.  Abdominal exam is soft and benign.    Past Medical History:  Diagnosis Date  . Arthritis   . BPH without obstruction/lower urinary tract symptoms   . Coronary artery disease   . Dysuria   . Enlarged prostate   . GERD (gastroesophageal reflux disease)   . Hypercholesteremia   . Hyperglycemia   . Hyperlipemia   . Hypertension   . Myocardial infarction ([place]) 1995  . Nocturia   . Prostatic hypertrophy   . Urinary frequency   . Urinary incontinence    Family History  Family history unknown: Yes   Past Surgical History:  Procedure Laterality Date  . CARDIAC CATHETERIZATION N/A 01/05/2016   Procedure: LEFT HEART CATH AND CORS/GRAFTS ANGIOGRAPHY;  Surgeon: Corey Skains, MD;  Location: [place] CV LAB;  Service: Cardiovascular;  Laterality: N/A;  . CARDIAC CATHETERIZATION N/A 01/05/2016   Procedure: Coronary Stent Intervention;  Surgeon: Isaias Cowman, MD;  Location: [place] [facility];  Service: Cardiovascular;  Laterality: N/A;  . cardiac stents  2017  . COLONOSCOPY WITH PROPOFOL N/A 07/02/2017   Procedure: COLONOSCOPY WITH PROPOFOL;  Surgeon: Lollie Sails, MD;  Location: [facility] ENDOSCOPY;  Service: Endoscopy;  Laterality: N/A;  . CORONARY  ARTERY BYPASS GRAFT     x2  . ESOPHAGOGASTRODUODENOSCOPY (EGD) WITH PROPOFOL N/A 05/27/2015   Procedure: ESOPHAGOGASTRODUODENOSCOPY (EGD) WITH PROPOFOL;  Surgeon: Hulen Luster, MD;  Location: [facility] ENDOSCOPY;  Service: Gastroenterology;  Laterality: N/A;  . VASECTOMY     Patient Active Problem List   Diagnosis Date Noted  . CAD (coronary artery disease) 01/05/2016  . Unstable angina ([place]) 12/30/2015  . Encounter for screening for malignant neoplasm of colon 04/06/2014  . Arteriosclerosis of coronary artery 02/25/2014  . Blood glucose elevated 02/25/2014  . HLD (hyperlipidemia) 02/25/2014  . BP (high blood pressure) 02/25/2014  . Benign fibroma of prostate 02/25/2014  . Incomplete bladder emptying 02/17/2013  . Benign prostatic hyperplasia with urinary obstruction 06/14/2012  . Excessive urination at night 06/14/2012  . FOM (frequency of micturition) 06/14/2012      Prior to Admission medications   Medication Sig Start Date End Date Taking? Authorizing Provider  acidophilus (RISAQUAD) CAPS capsule Take 1 capsule by mouth daily.    [provider]  amLODipine-benazepril (LOTREL) 5-10 MG capsule Take 1 capsule by mouth every morning.  07/20/15   [provider]  atorvastatin (LIPITOR) 80 MG tablet Take 1 tablet (80 mg total) by mouth daily. Patient taking differently: Take 80 mg by mouth at bedtime.  01/06/16   Corey Skains, MD  bisacodyl (DULCOLAX) 5 MG EC tablet Take 5 mg by mouth daily as needed for moderate constipation.    [provider]  cholecalciferol (VITAMIN D3) 25 MCG (1000 UNIT) tablet Take 1,000 Units by mouth daily.    [provider]  clopidogrel (PLAVIX) 75 MG tablet Take 1 tablet (75 mg total) by mouth daily with breakfast. 01/06/16   Corey Skains, MD  Hydrocortisone, Perianal, 1 % CREA Place 1 application rectally daily.  08/26/19   [provider]  Multiple Vitamin (MULTI-VITAMINS) TABS Take 1 tablet by mouth daily.      [provider]  multivitamin-lutein (OCUVITE-LUTEIN) CAPS capsule Take 1 capsule by mouth daily.    [provider]  polyethylene glycol (MIRALAX / GLYCOLAX) 17 g packet Take 17 g by mouth daily as needed.    [provider]  senna (SENOKOT) 8.6 MG TABS tablet Take 1 tablet by mouth daily as needed for mild constipation.    [provider]  sulfamethoxazole-trimethoprim (BACTRIM DS) 800-160 MG tablet Take 1 tablet by mouth daily. 10/17/19   Billey Co, MD    Allergies Peanuts [peanut oil]    Social History Social History   Tobacco Use  . Smoking status: Former Smoker    Packs/day: 1.50    Years: 20.00    Pack years: 30.00    Types: Cigarettes    Quit date: 05/22/1993    Years since quitting: 26.4  . Smokeless tobacco: Never Used  Substance Use Topics  . Alcohol use: No    Alcohol/week: 0.0 standard drinks  . Drug use: No    Review of Systems Patient denies headaches, rhinorrhea, blurry vision, numbness, shortness of breath, chest pain, edema, cough, abdominal pain, nausea, vomiting, diarrhea, dysuria, fevers, rashes or hallucinations unless otherwise stated above in HPI. ____________________________________________   PHYSICAL EXAM:  VITAL SIGNS: Vitals:   10/17/19 2047  BP: (!) 151/72  Pulse: 100  Temp: 98.1 F (36.7 C)  SpO2: 94%    Constitutional: Alert and oriented.  Eyes: Conjunctivae are normal.  Head: Atraumatic. Nose: No congestion/rhinnorhea. Mouth/Throat: Mucous membranes are moist.   Neck: No stridor. Painless ROM.  Cardiovascular: Normal rate, regular rhythm. Grossly normal heart sounds.  Good peripheral circulation. Respiratory: Normal respiratory effort.  No retractions. Lungs CTAB. Gastrointestinal: Soft and nontender. No distention. No abdominal bruits. No CVA tenderness. Genitourinary: foley catheter in position, with pale, pink tinged urine draining from catheter Musculoskeletal: No lower extremity  tenderness nor edema.  No joint effusions. Neurologic:  Normal speech and language. No gross focal neurologic deficits are appreciated. No facial droop Skin:  Skin is warm, dry and intact. No rash noted. Psychiatric: Mood and affect are normal. Speech and behavior are normal.  ____________________________________________   LABS (all labs ordered are listed, but only abnormal results are displayed)  No results found for this or any previous visit (from the past 24 hour(s)). ____________________________________________  EKG ____________________________________________  RADIOLOGY  EMERGENCY DEPARTMENT ULTRASOUND  Study: Limited Ultrasound of Bladder  INDICATIONS: to assess for urinary retention and/or bladder volume prior to urinary catheter Multiple views of the bladder were obtained in real-time in the transverse and longitudinal planes with a multi-frequency probe.  PERFORMED BY: Myself IMAGES ARCHIVED?: No LIMITATIONS:  none INTERPRETATION: Minimal Volume,  Foley bulb consuming bladder void, no retention or clots noted      _____________________________________   PROCEDURES  Procedure(s) performed:  Procedures    Critical Care performed: no ____________________________________________   INITIAL IMPRESSION / ASSESSMENT AND PLAN / ED COURSE  Pertinent labs & imaging results that were available during my care of the patient were reviewed by  me and considered in my medical decision making (see chart for details).   DDX: Urinary retention, hematuria, postop bleeding  Timothy Bass is a 86 y.o. who presents to the ED with presentation as described above.  Patient nontoxic-appearing.  Bladder is decompressed with Foley catheter in appropriate position.  Does have some hematuria but no evidence of any clots.  Ultrasound does not show any evidence of retention or clot burden.  Discussed case with Dr. Caprice Beaver in consultation.  No indication for further diagnostic testing or  imaging as the patient's catheter is draining without any signs of retention.  Patient currently pain-free.  Reassurance and additional catheter care education provided to the patient and patient's son at bedside.  Demonstrate understanding.  Denies any other complaints or concerns.  Requesting discharge home.     The patient was evaluated in Emergency Department today for the symptoms described in the history of present illness. He/she was evaluated in the context of the global COVID-19 pandemic, which necessitated consideration that the patient might be at risk for infection with the SARS-CoV-2 virus that causes COVID-19. Institutional protocols and algorithms that pertain to the evaluation of patients at risk for COVID-19 are in a state of rapid change based on information released by regulatory bodies including the CDC and federal and state organizations. These policies and algorithms were followed during the patient's care in the ED.  As part of my medical decision making, I reviewed the following data within the [place] notes reviewed and incorporated, Labs reviewed, notes from prior ED visits and [place] Controlled Substance Database   ____________________________________________   FINAL CLINICAL IMPRESSION(S) / ED DIAGNOSES  Final diagnoses:  Hematuria, unspecified type      NEW MEDICATIONS STARTED DURING THIS VISIT:  New Prescriptions   No medications on file     Note:  This document was prepared using Dragon voice recognition software and may include unintentional dictation errors.    Merlyn Lot, MD 10/17/19 2144

## 2019-10-17 NOTE — ED Notes (Signed)
See triage note  Here to see Dr Diamantina Providence d/t blood on foley  Provider at bedside

## 2019-10-17 NOTE — Progress Notes (Signed)
   Timothy Bass is an 86 year old [place] speaking male with BPH and 160 g prostate with incomplete bladder emptying and recurrent UTIs who underwent a HOLEP with me this morning with removal of >100 g tissue.  He presents back to the ED with his son this evening with concerns for hematuria in the catheter and some penile pain.  On exam, the catheter is on significant tension to the lower leg.  There is maroon urine in the bag and light red urine in the tubing.  I took the catheter off of tension and it irrigated easily with 120 cc of saline with return of minimal old clot and cleared rapidly.  The bladder was empty.  An additional 15 mL were placed in the balloon for a total of 75 mL.  The catheter irrigated easily with saline and was light pink.  I carefully secured the catheter to the left thigh off of tension and provided extensive Foley education to the patient and his son.  Irrigation supplies were given to irrigate at home if any concerns for non-draining catheter.  I encouraged him to push fluids over the next few days.  Should not resume Eliquis until 5 days after surgery.  Keep follow-up as scheduled for Foley removal early next week  Billey Co, MD

## 2019-10-17 NOTE — Anesthesia Procedure Notes (Signed)
Procedure Name: Intubation Date/Time: 10/17/2019 7:44 AM Performed by: Gentry Fitz, CRNA Pre-anesthesia Checklist: Patient identified, Emergency Drugs available, Suction available and Patient being monitored Patient Re-evaluated:Patient Re-evaluated prior to induction Oxygen Delivery Method: Circle system utilized Preoxygenation: Pre-oxygenation with 100% oxygen Induction Type: IV induction Ventilation: Mask ventilation without difficulty Laryngoscope Size: McGraph and 3 Grade View: Grade I Tube type: Oral Tube size: 7.5 mm Number of attempts: 1 Airway Equipment and Method: Stylet Placement Confirmation: ETT inserted through vocal cords under direct vision,  positive ETCO2 and breath sounds checked- equal and bilateral Secured at: 19 cm Tube secured with: Tape Dental Injury: Teeth and Oropharynx as per pre-operative assessment

## 2019-10-17 NOTE — Discharge Instructions (Addendum)
Please drink plenty of fluids.  Follow up with Dr. Diamantina Providence.  Return for any additional questions or concerns.

## 2019-10-17 NOTE — Anesthesia Postprocedure Evaluation (Signed)
Anesthesia Post Note  Patient: Timothy Bass  Procedure(s) Performed: HOLEP-LASER ENUCLEATION OF THE PROSTATE WITH MORCELLATION (N/A Prostate)  Patient location during evaluation: PACU Anesthesia Type: General Level of consciousness: awake and alert Pain management: pain level controlled Vital Signs Assessment: post-procedure vital signs reviewed and stable Respiratory status: spontaneous breathing, nonlabored ventilation, respiratory function stable and patient connected to nasal cannula oxygen Cardiovascular status: blood pressure returned to baseline and stable Postop Assessment: no apparent nausea or vomiting Anesthetic complications: no     Last Vitals:  Vitals:   10/17/19 1158 10/17/19 1310  BP: 138/71 135/72  Pulse: 91 89  Resp: 18 20  Temp: (!) 36.1 C (!) 36.1 C  SpO2: 92% 95%    Last Pain:  Vitals:   10/17/19 1310  TempSrc:   PainSc: 0-No pain                 Martha Clan

## 2019-10-21 ENCOUNTER — Ambulatory Visit (INDEPENDENT_AMBULATORY_CARE_PROVIDER_SITE_OTHER): Payer: Medicare HMO | Admitting: Physician Assistant

## 2019-10-21 ENCOUNTER — Ambulatory Visit: Payer: Medicare HMO | Admitting: Physician Assistant

## 2019-10-21 ENCOUNTER — Other Ambulatory Visit: Payer: Self-pay

## 2019-10-21 ENCOUNTER — Encounter: Payer: Self-pay | Admitting: Physician Assistant

## 2019-10-21 VITALS — BP 155/72 | HR 96 | Ht 66.0 in | Wt 177.0 lb

## 2019-10-21 DIAGNOSIS — K5909 Other constipation: Secondary | ICD-10-CM | POA: Diagnosis not present

## 2019-10-21 DIAGNOSIS — R3914 Feeling of incomplete bladder emptying: Secondary | ICD-10-CM

## 2019-10-21 DIAGNOSIS — Z86010 Personal history of colonic polyps: Secondary | ICD-10-CM | POA: Diagnosis not present

## 2019-10-21 DIAGNOSIS — N401 Enlarged prostate with lower urinary tract symptoms: Secondary | ICD-10-CM | POA: Diagnosis not present

## 2019-10-21 LAB — BLADDER SCAN AMB NON-IMAGING

## 2019-10-21 NOTE — Patient Instructions (Signed)

## 2019-10-21 NOTE — Progress Notes (Signed)
Afternoon Follow Up  Patient returned to clinic this afternoon for repeat PVR.  He reports drinking approximately 35oz of water since Foley removal this morning.  He has been able to void multiple times at home.  PVR 124 mL.  Results for orders placed or performed in visit on 10/21/19  BLADDER SCAN AMB NON-IMAGING  Result Value Ref Range   Scan Result 148mL     Voiding trial passed.  Counseled patient to start Kegel exercises 3x10 sets daily.  Verbal and written instructions provided today.  Reiterated that he should restart blood thinners tomorrow.  He expressed understanding.  Follow-up: Scheduled as below. Future Appointments  Date Time Provider [place]  12/11/2019  9:45 AM Billey Co, MD [facility] None

## 2019-10-21 NOTE — Progress Notes (Signed)
Fill and Pull Catheter Removal  Patient is present today for a catheter removal.  Patient was cleaned and prepped in a sterile fashion 171ml of sterile saline was instilled into the bladder when the patient felt the urge to urinate. 58ml of water was then drained from the balloon.  A 24FR three-way foley cath was removed from the bladder: he had a bladder spasm with removal with efflux of an unmeasured volume of fluid.  Patient was then given some time to void on their own.  Patient can void 40ml on their own after some time.  Patient tolerated well.  Performed by: Debroah Loop, PA-C   Follow up/ Additional notes: Push fluids and RTC this afternoon for PVR.

## 2019-10-22 LAB — SURGICAL PATHOLOGY

## 2019-10-23 DIAGNOSIS — K5909 Other constipation: Secondary | ICD-10-CM | POA: Diagnosis not present

## 2019-10-23 DIAGNOSIS — Z86010 Personal history of colonic polyps: Secondary | ICD-10-CM | POA: Diagnosis not present

## 2019-10-24 ENCOUNTER — Ambulatory Visit: Payer: Medicare HMO | Admitting: Urology

## 2019-10-24 ENCOUNTER — Telehealth: Payer: Self-pay

## 2019-10-24 NOTE — Telephone Encounter (Signed)
-----   Message from Billey Co, MD sent at 10/23/2019 12:40 PM EDT ----- Kermit Balo news, no prostate cancer seen on HOLEP tissue, follow up as scheduled  Nickolas Madrid, MD 10/23/2019

## 2019-10-24 NOTE — Telephone Encounter (Signed)
Called pt's son per DPR informed him of the information below. Son gave verbal understanding.

## 2019-11-14 DIAGNOSIS — I10 Essential (primary) hypertension: Secondary | ICD-10-CM | POA: Diagnosis not present

## 2019-11-14 DIAGNOSIS — R739 Hyperglycemia, unspecified: Secondary | ICD-10-CM | POA: Diagnosis not present

## 2019-11-27 ENCOUNTER — Encounter: Payer: Self-pay | Admitting: Urology

## 2019-12-10 DIAGNOSIS — D649 Anemia, unspecified: Secondary | ICD-10-CM | POA: Diagnosis not present

## 2019-12-11 ENCOUNTER — Ambulatory Visit: Payer: Medicare HMO | Admitting: Urology

## 2019-12-11 ENCOUNTER — Other Ambulatory Visit: Payer: Self-pay

## 2019-12-11 ENCOUNTER — Encounter: Payer: Self-pay | Admitting: Urology

## 2019-12-11 VITALS — BP 144/64 | HR 89 | Ht 66.0 in | Wt 174.2 lb

## 2019-12-11 DIAGNOSIS — N138 Other obstructive and reflux uropathy: Secondary | ICD-10-CM

## 2019-12-11 DIAGNOSIS — N401 Enlarged prostate with lower urinary tract symptoms: Secondary | ICD-10-CM

## 2019-12-11 DIAGNOSIS — Z9889 Other specified postprocedural states: Secondary | ICD-10-CM | POA: Diagnosis not present

## 2019-12-11 DIAGNOSIS — N390 Urinary tract infection, site not specified: Secondary | ICD-10-CM

## 2019-12-11 LAB — BLADDER SCAN AMB NON-IMAGING

## 2019-12-11 NOTE — Progress Notes (Signed)
° °  12/11/2019 10:36 AM   Rockwell [place] [birth year] [phone]  Reason for visit: Follow up HoLEP  HPI: I saw Mr. Salguero back in urology clinic for follow-up.  He is a 86 year old male who underwent an uncomplicated HOLEP on 5/99/7741 for severe BPH symptoms of weak stream, incomplete emptying, elevated PVRs greater than 250 mL, urgency, frequency, and UTIs.  59 g of tissue were removed showing only benign prostate tissue.  He reports he has been voiding well with a very strong stream and his urinary symptoms have improved significantly.  He still has some occasional urgency and urge incontinence, but this continues to improve.  He has nocturia 0-2 times overnight.  He denies any hematuria or dysuria.  Reassurance provided, progressing as expected after HOLEP, RTC 6 months for IPSS/PVR symptom check Okay to discontinue Flomax and finasteride   Billey Co, MD  [facility] [address], [place], [place] [address] [phone]

## 2019-12-11 NOTE — Patient Instructions (Signed)

## 2019-12-15 DIAGNOSIS — H2513 Age-related nuclear cataract, bilateral: Secondary | ICD-10-CM | POA: Diagnosis not present

## 2020-03-03 DIAGNOSIS — Z23 Encounter for immunization: Secondary | ICD-10-CM | POA: Diagnosis not present

## 2020-03-03 DIAGNOSIS — R739 Hyperglycemia, unspecified: Secondary | ICD-10-CM | POA: Diagnosis not present

## 2020-03-03 DIAGNOSIS — N400 Enlarged prostate without lower urinary tract symptoms: Secondary | ICD-10-CM | POA: Diagnosis not present

## 2020-03-03 DIAGNOSIS — E7801 Familial hypercholesterolemia: Secondary | ICD-10-CM | POA: Diagnosis not present

## 2020-03-03 DIAGNOSIS — I10 Essential (primary) hypertension: Secondary | ICD-10-CM | POA: Diagnosis not present

## 2020-03-03 DIAGNOSIS — K5900 Constipation, unspecified: Secondary | ICD-10-CM | POA: Diagnosis not present

## 2020-03-10 DIAGNOSIS — I10 Essential (primary) hypertension: Secondary | ICD-10-CM | POA: Diagnosis not present

## 2020-03-10 DIAGNOSIS — R739 Hyperglycemia, unspecified: Secondary | ICD-10-CM | POA: Diagnosis not present

## 2020-03-10 DIAGNOSIS — E7801 Familial hypercholesterolemia: Secondary | ICD-10-CM | POA: Diagnosis not present

## 2020-04-28 DIAGNOSIS — I10 Essential (primary) hypertension: Secondary | ICD-10-CM | POA: Diagnosis not present

## 2020-04-28 DIAGNOSIS — I25810 Atherosclerosis of coronary artery bypass graft(s) without angina pectoris: Secondary | ICD-10-CM | POA: Diagnosis not present

## 2020-04-28 DIAGNOSIS — R0602 Shortness of breath: Secondary | ICD-10-CM | POA: Diagnosis not present

## 2020-04-28 DIAGNOSIS — E7801 Familial hypercholesterolemia: Secondary | ICD-10-CM | POA: Diagnosis not present

## 2020-05-04 DIAGNOSIS — K143 Hypertrophy of tongue papillae: Secondary | ICD-10-CM | POA: Diagnosis not present

## 2020-05-04 DIAGNOSIS — K5909 Other constipation: Secondary | ICD-10-CM | POA: Diagnosis not present

## 2020-05-28 DIAGNOSIS — Z8616 Personal history of COVID-19: Secondary | ICD-10-CM

## 2020-05-28 DIAGNOSIS — J029 Acute pharyngitis, unspecified: Secondary | ICD-10-CM | POA: Diagnosis not present

## 2020-05-28 DIAGNOSIS — R059 Cough, unspecified: Secondary | ICD-10-CM | POA: Diagnosis not present

## 2020-05-28 DIAGNOSIS — R0989 Other specified symptoms and signs involving the circulatory and respiratory systems: Secondary | ICD-10-CM | POA: Diagnosis not present

## 2020-05-28 HISTORY — DX: Personal history of COVID-19: Z86.16

## 2020-06-07 ENCOUNTER — Ambulatory Visit: Payer: Self-pay | Admitting: Urology

## 2020-06-14 ENCOUNTER — Ambulatory Visit: Payer: Self-pay | Admitting: Urology

## 2020-06-23 DIAGNOSIS — H6123 Impacted cerumen, bilateral: Secondary | ICD-10-CM | POA: Diagnosis not present

## 2020-06-23 DIAGNOSIS — H6063 Unspecified chronic otitis externa, bilateral: Secondary | ICD-10-CM | POA: Diagnosis not present

## 2020-06-23 DIAGNOSIS — J310 Chronic rhinitis: Secondary | ICD-10-CM | POA: Diagnosis not present

## 2020-07-14 DIAGNOSIS — H60339 Swimmer's ear, unspecified ear: Secondary | ICD-10-CM | POA: Diagnosis not present

## 2020-07-14 DIAGNOSIS — K143 Hypertrophy of tongue papillae: Secondary | ICD-10-CM | POA: Diagnosis not present

## 2020-08-17 DIAGNOSIS — G5791 Unspecified mononeuropathy of right lower limb: Secondary | ICD-10-CM | POA: Diagnosis not present

## 2020-08-17 DIAGNOSIS — M79671 Pain in right foot: Secondary | ICD-10-CM | POA: Diagnosis not present

## 2020-08-17 DIAGNOSIS — M79672 Pain in left foot: Secondary | ICD-10-CM | POA: Diagnosis not present

## 2020-08-17 DIAGNOSIS — G5792 Unspecified mononeuropathy of left lower limb: Secondary | ICD-10-CM | POA: Diagnosis not present

## 2020-08-19 ENCOUNTER — Other Ambulatory Visit: Payer: Self-pay | Admitting: Physician Assistant

## 2020-08-19 DIAGNOSIS — N401 Enlarged prostate with lower urinary tract symptoms: Secondary | ICD-10-CM

## 2020-08-24 DIAGNOSIS — Z86010 Personal history of colonic polyps: Secondary | ICD-10-CM | POA: Diagnosis not present

## 2020-08-24 DIAGNOSIS — K5909 Other constipation: Secondary | ICD-10-CM | POA: Diagnosis not present

## 2020-08-26 DIAGNOSIS — Z86010 Personal history of colonic polyps: Secondary | ICD-10-CM | POA: Diagnosis not present

## 2020-09-09 DIAGNOSIS — R0602 Shortness of breath: Secondary | ICD-10-CM | POA: Diagnosis not present

## 2020-09-09 DIAGNOSIS — I2510 Atherosclerotic heart disease of native coronary artery without angina pectoris: Secondary | ICD-10-CM | POA: Diagnosis not present

## 2020-09-09 DIAGNOSIS — I10 Essential (primary) hypertension: Secondary | ICD-10-CM | POA: Diagnosis not present

## 2020-09-09 DIAGNOSIS — I25810 Atherosclerosis of coronary artery bypass graft(s) without angina pectoris: Secondary | ICD-10-CM | POA: Diagnosis not present

## 2020-09-09 DIAGNOSIS — E7801 Familial hypercholesterolemia: Secondary | ICD-10-CM | POA: Diagnosis not present

## 2020-10-11 DIAGNOSIS — I10 Essential (primary) hypertension: Secondary | ICD-10-CM | POA: Diagnosis not present

## 2020-10-11 DIAGNOSIS — M65339 Trigger finger, unspecified middle finger: Secondary | ICD-10-CM | POA: Diagnosis not present

## 2020-10-11 DIAGNOSIS — K5900 Constipation, unspecified: Secondary | ICD-10-CM | POA: Diagnosis not present

## 2020-10-11 DIAGNOSIS — R739 Hyperglycemia, unspecified: Secondary | ICD-10-CM | POA: Diagnosis not present

## 2020-10-11 DIAGNOSIS — E7801 Familial hypercholesterolemia: Secondary | ICD-10-CM | POA: Diagnosis not present

## 2020-10-11 DIAGNOSIS — N400 Enlarged prostate without lower urinary tract symptoms: Secondary | ICD-10-CM | POA: Diagnosis not present

## 2020-10-11 DIAGNOSIS — I25810 Atherosclerosis of coronary artery bypass graft(s) without angina pectoris: Secondary | ICD-10-CM | POA: Diagnosis not present

## 2020-10-11 DIAGNOSIS — I2510 Atherosclerotic heart disease of native coronary artery without angina pectoris: Secondary | ICD-10-CM | POA: Diagnosis not present

## 2020-10-22 ENCOUNTER — Other Ambulatory Visit: Payer: Self-pay | Admitting: Physician Assistant

## 2020-10-22 DIAGNOSIS — R350 Frequency of micturition: Secondary | ICD-10-CM

## 2020-11-03 ENCOUNTER — Other Ambulatory Visit: Payer: Self-pay

## 2020-11-03 ENCOUNTER — Encounter: Payer: Self-pay | Admitting: Urology

## 2020-11-03 ENCOUNTER — Ambulatory Visit (INDEPENDENT_AMBULATORY_CARE_PROVIDER_SITE_OTHER): Payer: Medicare HMO | Admitting: Urology

## 2020-11-03 VITALS — BP 139/70 | HR 85 | Ht 66.0 in | Wt 170.0 lb

## 2020-11-03 DIAGNOSIS — N138 Other obstructive and reflux uropathy: Secondary | ICD-10-CM | POA: Diagnosis not present

## 2020-11-03 DIAGNOSIS — R300 Dysuria: Secondary | ICD-10-CM

## 2020-11-03 DIAGNOSIS — N401 Enlarged prostate with lower urinary tract symptoms: Secondary | ICD-10-CM

## 2020-11-03 LAB — BLADDER SCAN AMB NON-IMAGING: Scan Result: 115

## 2020-11-03 NOTE — Progress Notes (Signed)
11/03/2020 4:53 PM   Rockwell Germany 27-Nov-1932 [id number]  Referring provider: Juluis Pitch, MD [phone] [place],  [place] [address]  Urological history: 1. BPH with LU TS -s/p HoLEP 09/2019 -taking tamsulosin 0.4 mg daily   2. Incomplete bladder emptying -PVR 115 mL    Chief Complaint  Patient presents with   Dysuria    HPI: Timothy Bass  is a 87 y.o. male who presents today for symptoms of LU TS.  He states that he started to have symptoms of minor pain with voiding, frequency and slowing of the urinary stream.  He attributes these symptoms to the fact he took 2 tamsulosin tablets accidentally a few days ago.  He states he has been taking his tamsulosin since his HoLEP surgery.    Patient denies any modifying or aggravating factors.  Patient denies any gross hematuria, dysuria or suprapubic/flank pain.  Patient denies any fevers, chills, nausea or vomiting.   UA clear.  PVR 115 mL.   PMH: Past Medical History:  Diagnosis Date   Arthritis    BPH without obstruction/lower urinary tract symptoms    Coronary artery disease    Dysuria    Enlarged prostate    GERD (gastroesophageal reflux disease)    Hypercholesteremia    Hyperglycemia    Hyperlipemia    Hypertension    Myocardial infarction ([place]) 1995   Nocturia    Prostatic hypertrophy    Urinary frequency    Urinary incontinence     Surgical History: Past Surgical History:  Procedure Laterality Date   CARDIAC CATHETERIZATION N/A 01/05/2016   Procedure: LEFT HEART CATH AND CORS/GRAFTS ANGIOGRAPHY;  Surgeon: Corey Skains, MD;  Location: [place] [facility];  Service: Cardiovascular;  Laterality: N/A;   CARDIAC CATHETERIZATION N/A 01/05/2016   Procedure: Coronary Stent Intervention;  Surgeon: Isaias Cowman, MD;  Location: [place] [facility];  Service: Cardiovascular;  Laterality: N/A;   cardiac stents  2017   COLONOSCOPY WITH PROPOFOL N/A 07/02/2017   Procedure: COLONOSCOPY WITH PROPOFOL;   Surgeon: Lollie Sails, MD;  Location: [facility] ENDOSCOPY;  Service: Endoscopy;  Laterality: N/A;   CORONARY ARTERY BYPASS GRAFT     x2   ESOPHAGOGASTRODUODENOSCOPY (EGD) WITH PROPOFOL N/A 05/27/2015   Procedure: ESOPHAGOGASTRODUODENOSCOPY (EGD) WITH PROPOFOL;  Surgeon: Hulen Luster, MD;  Location: [facility] ENDOSCOPY;  Service: Gastroenterology;  Laterality: N/A;   HOLEP-LASER ENUCLEATION OF THE PROSTATE WITH MORCELLATION N/A 10/17/2019   Procedure: HOLEP-LASER ENUCLEATION OF THE PROSTATE WITH MORCELLATION;  Surgeon: Billey Co, MD;  Location: [facility] ORS;  Service: Urology;  Laterality: N/A;   VASECTOMY      Home Medications:  Allergies as of 11/03/2020       Reactions   Peanuts [peanut Oil] Itching        Medication List        Accurate as of November 03, 2020  4:53 PM. If you have any questions, ask your nurse or doctor.          acidophilus Caps capsule Take 1 capsule by mouth daily.   amLODipine-benazepril 5-10 MG capsule Commonly known as: LOTREL Take 1 capsule by mouth every morning.   atorvastatin 80 MG tablet Commonly known as: Lipitor Take 1 tablet (80 mg total) by mouth daily. What changed: when to take this   bisacodyl 5 MG EC tablet Commonly known as: DULCOLAX Take 5 mg by mouth daily as needed for moderate constipation.   cholecalciferol 25 MCG (1000 UNIT) tablet  Commonly known as: VITAMIN D3 Take 1,000 Units by mouth daily.   clopidogrel 75 MG tablet Commonly known as: PLAVIX Take 1 tablet (75 mg total) by mouth daily with breakfast.   Hydrocortisone (Perianal) 1 % Crea Place 1 application rectally daily.   METAMUCIL FIBER PO Take by mouth daily.   OMEGA-3 FISH OIL PO Take by mouth daily.   ONE-A-DAY MENS 50+ PO Take by mouth daily. What changed: Another medication with the same name was removed. Continue taking this medication, and follow the directions you see here. Changed by: Zara Council, PA-C   oxybutynin 10 MG 24 hr tablet Commonly  known as: DITROPAN-XL Take 10 mg by mouth at bedtime.   polyethylene glycol 17 g packet Commonly known as: MIRALAX / GLYCOLAX Take 17 g by mouth daily as needed.   senna 8.6 MG Tabs tablet Commonly known as: SENOKOT Take 1 tablet by mouth daily as needed for mild constipation.   terazosin 2 MG capsule Commonly known as: HYTRIN Take 2 mg by mouth at bedtime.        Allergies:  Allergies  Allergen Reactions   Peanuts [Peanut Oil] Itching    Family History: Family History  Family history unknown: Yes    Social History:  reports that he quit smoking about 27 years ago. His smoking use included cigarettes. He has a 30.00 pack-year smoking history. He has never used smokeless tobacco. He reports that he does not drink alcohol and does not use drugs.  ROS: Pertinent ROS in HPI  Physical Exam: BP 139/70   Pulse 85   Ht 5' 6"  (1.676 m)   Wt 170 lb (77.1 kg)   BMI 27.44 kg/m   Constitutional:  Well nourished. Alert and oriented, No acute distress. HEENT: [place] AT, mask in place.  Trachea midline Cardiovascular: No clubbing, cyanosis, or edema. Respiratory: Normal respiratory effort, no increased work of breathing. Neurologic: Grossly intact, no focal deficits, moving all 4 extremities. Psychiatric: Normal mood and affect.  Laboratory Data: WBC (White Blood Cell Count) 4.1 - 10.2 10^3/uL 4.7   RBC (Red Blood Cell Count) 4.69 - 6.13 10^6/uL 4.33 Low    Hemoglobin 14.1 - 18.1 gm/dL 14.1   Hematocrit 40.0 - 52.0 % 41.6   MCV (Mean Corpuscular Volume) 80.0 - 100.0 fl 96.1   MCH (Mean Corpuscular Hemoglobin) 27.0 - 31.2 pg 32.6 High    MCHC (Mean Corpuscular Hemoglobin Concentration) 32.0 - 36.0 gm/dL 33.9   Platelet Count 150 - 450 10^3/uL 182   RDW-CV (Red Cell Distribution Width) 11.6 - 14.8 % 12.8   MPV (Mean Platelet Volume) 9.4 - 12.4 fl 8.0 Low    Neutrophils 1.50 - 7.80 10^3/uL 2.80   Lymphocytes 1.00 - 3.60 10^3/uL 1.50   Mixed Count 0.10 - 0.90 10^3/uL 0.40    Neutrophil % 32.0 - 70.0 % 59.3   Lymphocyte % 10.0 - 50.0 % 32.5   Mixed % 3.0 - 14.4 % 8.2   Resulting Agency  [facility]  Specimen Collected: 10/11/20 14:49 Last Resulted: 10/11/20 14:56  Received From: [facility]  Result Received: 10/22/20 07:50   Glucose 70 - 110 mg/dL 109   Sodium 136 - 145 mmol/L 140   Potassium 3.6 - 5.1 mmol/L 4.4   Chloride 97 - 109 mmol/L 104   Carbon Dioxide (CO2) 22.0 - 32.0 mmol/L 32.3 High    Urea Nitrogen (BUN) 7 - 25 mg/dL 13   Creatinine 0.7 - 1.3 mg/dL 0.8  Glomerular Filtration Rate (eGFR), MDRD Estimate >60 mL/min/1.73sq m 92   Calcium 8.7 - 10.3 mg/dL 9.4   AST  8 - 39 U/L 24   ALT  6 - 57 U/L 25   Alk Phos (alkaline Phosphatase) 34 - 104 U/L 72   Albumin 3.5 - 4.8 g/dL 4.3   Bilirubin, Total 0.3 - 1.2 mg/dL 0.7   Protein, Total 6.1 - 7.9 g/dL 6.3   A/G Ratio 1.0 - 5.0 gm/dL 2.2   [facility] - [facility]  Specimen Collected: 10/11/20 14:49 Last Resulted: 10/12/20 12:27  Received From: [facility]  Result Received: 10/22/20 07:50   Hemoglobin A1C 4.2 - 5.6 % 6.0 High    Average Blood Glucose (Calc) mg/dL 126   Resulting Agency  [facility] - [facility]   Narrative Performed by [facility] Normal Range:    4.2 - 5.6%  Increased Risk:  5.7 - 6.4%  Diabetes:        >= 6.5%  Glycemic Control for adults with diabetes:  <7%   Specimen Collected: 10/11/20 14:49 Last Resulted: 10/12/20 13:53  Received From: [facility]  Result Received: 10/22/20 07:50   Urinalysis Component     Latest Ref Rng & Units 11/03/2020  Specific Gravity, UA     1.005 - 1.030 1.015  pH, UA     5.0 - 7.5 6.5  Color, UA     Yellow Yellow  Appearance Ur     Clear Clear  Leukocytes,UA     Negative Negative  Protein,UA     Negative/Trace Negative  Glucose, UA     Negative Negative  Ketones, UA     Negative Negative  RBC, UA     Negative Trace (A)   Bilirubin, UA     Negative Negative  Urobilinogen, Ur     0.2 - 1.0 mg/dL 0.2  Nitrite, UA     Negative Negative  Microscopic Examination      See below:   Component     Latest Ref Rng & Units 11/03/2020  WBC, UA     0 - 5 /hpf 0-5  RBC     0 - 2 /hpf 0-2  Epithelial Cells (non renal)     0 - 10 /hpf 0-10  Bacteria, UA     None seen/Few None seen  I have reviewed the labs.   Pertinent Imaging: Results for NYHEEM, BINETTE (MRN [id number]) as of 11/03/2020 16:49  Ref. Range 11/03/2020 13:47  Scan Result Unknown 115 ml    Assessment & Plan:    1. Dysuria - Urinalysis, Complete - urine sent for culture - Bladder Scan (Post Void Residual) in office -Explained that the urine was clear, but we will send it for culture to rule out any indolent infection -Stated that sometimes after prostate procedure scar tissue can develop and a cystoscopic exam can help better evaluate for this, he deferred at this time stating he would like to see if it improves over the next few weeks   2. BPH with LU TS -Continue tamsulosin 0.4 mg daily  Return for Pending urine culture results.  These notes generated with voice recognition software. I apologize for typographical errors.  Zara Council, PA-C  [facility] [address]  [place], [place] [address] [phone]

## 2020-11-05 LAB — URINALYSIS, COMPLETE
Bilirubin, UA: NEGATIVE
Glucose, UA: NEGATIVE
Ketones, UA: NEGATIVE
Leukocytes,UA: NEGATIVE
Nitrite, UA: NEGATIVE
Protein,UA: NEGATIVE
Specific Gravity, UA: 1.015 (ref 1.005–1.030)
Urobilinogen, Ur: 0.2 mg/dL (ref 0.2–1.0)
pH, UA: 6.5 (ref 5.0–7.5)

## 2020-11-05 LAB — MICROSCOPIC EXAMINATION: Bacteria, UA: NONE SEEN

## 2020-11-06 LAB — CULTURE, URINE COMPREHENSIVE

## 2020-11-21 ENCOUNTER — Other Ambulatory Visit: Payer: Self-pay | Admitting: Physician Assistant

## 2020-11-21 DIAGNOSIS — N401 Enlarged prostate with lower urinary tract symptoms: Secondary | ICD-10-CM

## 2020-12-28 DIAGNOSIS — R140 Abdominal distension (gaseous): Secondary | ICD-10-CM | POA: Diagnosis not present

## 2020-12-28 DIAGNOSIS — K5900 Constipation, unspecified: Secondary | ICD-10-CM | POA: Diagnosis not present

## 2020-12-30 ENCOUNTER — Other Ambulatory Visit: Payer: Self-pay | Admitting: Gastroenterology

## 2020-12-30 DIAGNOSIS — R140 Abdominal distension (gaseous): Secondary | ICD-10-CM

## 2020-12-30 DIAGNOSIS — K5900 Constipation, unspecified: Secondary | ICD-10-CM

## 2021-01-03 ENCOUNTER — Other Ambulatory Visit: Payer: Self-pay | Admitting: Physician Assistant

## 2021-01-03 DIAGNOSIS — M65331 Trigger finger, right middle finger: Secondary | ICD-10-CM | POA: Diagnosis not present

## 2021-01-03 DIAGNOSIS — K5909 Other constipation: Secondary | ICD-10-CM | POA: Diagnosis not present

## 2021-01-06 ENCOUNTER — Telehealth: Payer: Self-pay | Admitting: Urology

## 2021-01-06 NOTE — Telephone Encounter (Signed)
Patient walked in requesting a refill on his medication Oxybutynin.

## 2021-01-10 ENCOUNTER — Other Ambulatory Visit: Payer: Self-pay | Admitting: Physician Assistant

## 2021-01-10 DIAGNOSIS — M65339 Trigger finger, unspecified middle finger: Secondary | ICD-10-CM | POA: Diagnosis not present

## 2021-01-10 DIAGNOSIS — M65332 Trigger finger, left middle finger: Secondary | ICD-10-CM | POA: Diagnosis not present

## 2021-01-10 DIAGNOSIS — M65331 Trigger finger, right middle finger: Secondary | ICD-10-CM | POA: Diagnosis not present

## 2021-01-12 ENCOUNTER — Other Ambulatory Visit: Payer: Self-pay

## 2021-01-12 ENCOUNTER — Ambulatory Visit
Admission: RE | Admit: 2021-01-12 | Discharge: 2021-01-12 | Disposition: A | Discharge status: Home / Self Care | Payer: Medicare HMO | Source: Ambulatory Visit | Attending: Gastroenterology | Admitting: Gastroenterology

## 2021-01-12 DIAGNOSIS — R140 Abdominal distension (gaseous): Secondary | ICD-10-CM | POA: Insufficient documentation

## 2021-01-12 DIAGNOSIS — K5900 Constipation, unspecified: Secondary | ICD-10-CM | POA: Diagnosis not present

## 2021-01-12 DIAGNOSIS — N281 Cyst of kidney, acquired: Secondary | ICD-10-CM | POA: Diagnosis not present

## 2021-01-12 DIAGNOSIS — K8020 Calculus of gallbladder without cholecystitis without obstruction: Secondary | ICD-10-CM | POA: Diagnosis not present

## 2021-01-12 LAB — POCT I-STAT CREATININE: Creatinine, Ser: 0.7 mg/dL (ref 0.61–1.24)

## 2021-01-12 MED ORDER — IOHEXOL 300 MG/ML  SOLN
100.0000 mL | Freq: Once | INTRAMUSCULAR | Status: AC | PRN
  Administered 2021-01-12: 100 mL via INTRAVENOUS

## 2021-02-01 NOTE — Progress Notes (Signed)
02/02/2021 10:49 AM   Rockwell Germany 1933-03-24 [id number]  Referring provider: Juluis Pitch, MD [phone] [place],  [place] [address]  Urological history: 1. BPH with LU TS -s/p HoLEP 09/2019 -I PSS 6/3 -taking tamsulosin 0.4 mg daily   2. Incomplete bladder emptying -PVR 26 mL  Chief Complaint  Patient presents with   Benign Prostatic Hypertrophy   HPI: Timothy Bass  is a 87 y.o. male who presents today for urine problems.   He continues to experience urinary urgency.  He had been on oxybutynin, but that prescription has run out.  Patient denies any modifying or aggravating factors.  Patient denies any gross hematuria, dysuria or suprapubic/flank pain.  Patient denies any fevers, chills, nausea or vomiting.    Contrast CT ordered by primary care performed for worsening constipation and abdominal bloating had incidental finding of calculus in or near the prostatic urethra.  UA benign  PVR minimal   IPSS     Row Name 02/02/21 1000         International Prostate Symptom Score   How often have you had the sensation of not emptying your bladder? Not at All     How often have you had to urinate less than every two hours? Less than half the time     How often have you found you stopped and started again several times when you urinated? Less than 1 in 5 times     How often have you found it difficult to postpone urination? Not at All     How often have you had a weak urinary stream? Not at All     How often have you had to strain to start urination? Not at All     How many times did you typically get up at night to urinate? 3 Times     Total IPSS Score 6           Quality of Life due to urinary symptoms   If you were to spend the rest of your life with your urinary condition just the way it is now how would you feel about that? Mixed              Score:  1-7 Mild 8-19 Moderate 20-35 Severe    PMH: Past Medical History:  Diagnosis Date   Arthritis     BPH without obstruction/lower urinary tract symptoms    Coronary artery disease    Dysuria    Enlarged prostate    GERD (gastroesophageal reflux disease)    Hypercholesteremia    Hyperglycemia    Hyperlipemia    Hypertension    Myocardial infarction ([place]) 1995   Nocturia    Prostatic hypertrophy    Urinary frequency    Urinary incontinence     Surgical History: Past Surgical History:  Procedure Laterality Date   CARDIAC CATHETERIZATION N/A 01/05/2016   Procedure: LEFT HEART CATH AND CORS/GRAFTS ANGIOGRAPHY;  Surgeon: Corey Skains, MD;  Location: [facility];  Service: Cardiovascular;  Laterality: N/A;   CARDIAC CATHETERIZATION N/A 01/05/2016   Procedure: Coronary Stent Intervention;  Surgeon: Isaias Cowman, MD;  Location: [place] [facility];  Service: Cardiovascular;  Laterality: N/A;   cardiac stents  2017   COLONOSCOPY WITH PROPOFOL N/A 07/02/2017   Procedure: COLONOSCOPY WITH PROPOFOL;  Surgeon: Lollie Sails, MD;  Location: [facility] ENDOSCOPY;  Service: Endoscopy;  Laterality: N/A;   CORONARY ARTERY BYPASS GRAFT     x2  ESOPHAGOGASTRODUODENOSCOPY (EGD) WITH PROPOFOL N/A 05/27/2015   Procedure: ESOPHAGOGASTRODUODENOSCOPY (EGD) WITH PROPOFOL;  Surgeon: Hulen Luster, MD;  Location: [facility] ENDOSCOPY;  Service: Gastroenterology;  Laterality: N/A;   HOLEP-LASER ENUCLEATION OF THE PROSTATE WITH MORCELLATION N/A 10/17/2019   Procedure: HOLEP-LASER ENUCLEATION OF THE PROSTATE WITH MORCELLATION;  Surgeon: Billey Co, MD;  Location: [facility] ORS;  Service: Urology;  Laterality: N/A;   VASECTOMY      Home Medications:  Allergies as of 02/02/2021       Reactions   Peanuts [peanut Oil] Itching        Medication List        Accurate as of February 02, 2021 10:49 AM. If you have any questions, ask your nurse or doctor.          STOP taking these medications    oxybutynin 10 MG 24 hr tablet Commonly known as: DITROPAN-XL Stopped by: Dewell Monnier, PA-C        TAKE these medications    acidophilus Caps capsule Take 1 capsule by mouth daily.   amLODipine-benazepril 5-10 MG capsule Commonly known as: LOTREL Take 1 capsule by mouth every morning.   atorvastatin 80 MG tablet Commonly known as: Lipitor Take 1 tablet (80 mg total) by mouth daily. What changed: when to take this   bisacodyl 5 MG EC tablet Commonly known as: DULCOLAX Take 5 mg by mouth daily as needed for moderate constipation.   cholecalciferol 25 MCG (1000 UNIT) tablet Commonly known as: VITAMIN D3 Take 1,000 Units by mouth daily.   clopidogrel 75 MG tablet Commonly known as: PLAVIX Take 1 tablet (75 mg total) by mouth daily with breakfast.   Gemtesa 75 MG Tabs Generic drug: Vibegron Take 75 mg by mouth daily. Started by: Zara Council, PA-C   Hydrocortisone (Perianal) 1 % Crea Place 1 application rectally daily.   METAMUCIL FIBER PO Take by mouth daily.   OMEGA-3 FISH OIL PO Take by mouth daily.   ONE-A-DAY MENS 50+ PO Take by mouth daily.   polyethylene glycol 17 g packet Commonly known as: MIRALAX / GLYCOLAX Take 17 g by mouth daily as needed.   senna 8.6 MG Tabs tablet Commonly known as: SENOKOT Take 1 tablet by mouth daily as needed for mild constipation.   terazosin 2 MG capsule Commonly known as: HYTRIN Take 2 mg by mouth at bedtime.        Allergies:  Allergies  Allergen Reactions   Peanuts [Peanut Oil] Itching    Family History: Family History  Family history unknown: Yes    Social History:  reports that he quit smoking about 27 years ago. His smoking use included cigarettes. He has a 30.00 pack-year smoking history. He has never used smokeless tobacco. He reports that he does not drink alcohol and does not use drugs.  ROS: Pertinent ROS in HPI  Physical Exam: BP 130/73   Pulse 82   Ht '5\' 4"'$  (1.626 m)   Wt 168 lb (76.2 kg)   BMI 28.84 kg/m   Constitutional:  Well nourished. Alert and oriented, No acute  distress. HEENT: [place] AT, mask in place.  Trachea midline Cardiovascular: No clubbing, cyanosis, or edema. Respiratory: Normal respiratory effort, no increased work of breathing. Neurologic: Grossly intact, no focal deficits, moving all 4 extremities. Psychiatric: Normal mood and affect.  Laboratory Data: Urinalysis Component     Latest Ref Rng & Units 02/02/2021  Specific Gravity, UA     1.005 - 1.030 1.020  pH, UA  5.0 - 7.5 6.5  Color, UA     Yellow Yellow  Appearance Ur     Clear Clear  Leukocytes,UA     Negative Negative  Protein,UA     Negative/Trace Negative  Glucose, UA     Negative Negative  Ketones, UA     Negative Negative  RBC, UA     Negative Trace (A)  Bilirubin, UA     Negative Negative  Urobilinogen, Ur     0.2 - 1.0 mg/dL 0.2  Nitrite, UA     Negative Negative  Microscopic Examination      See below:   Component     Latest Ref Rng & Units 02/02/2021  WBC, UA     0 - 5 /hpf 0-5  RBC     0 - 2 /hpf 0-2  Epithelial Cells (non renal)     0 - 10 /hpf None seen  Bacteria, UA     None seen/Few None seen  I have reviewed the labs.   Pertinent Imaging: CLINICAL DATA:  Worsening constipation, abdominal bloating for 4-5 years   EXAM: CT ABDOMEN AND PELVIS WITH CONTRAST   TECHNIQUE: Multidetector CT imaging of the abdomen and pelvis was performed using the standard protocol following bolus administration of intravenous contrast.   CONTRAST:  129m OMNIPAQUE IOHEXOL 300 MG/ML SOLN, additional oral enteric contrast   COMPARISON:  None.   FINDINGS: Lower chest: No acute abnormality. Dependent bibasilar partial atelectasis and/or scarring. Coronary artery calcifications.   Hepatobiliary: No solid liver abnormality is seen. Multiple small gallstones near the gallbladder neck (series 2, image 24). Gallbladder wall thickening, or biliary dilatation.   Pancreas: Unremarkable. No pancreatic ductal dilatation or surrounding inflammatory  changes.   Spleen: Normal in size without significant abnormality.   Adrenals/Urinary Tract: Adrenal glands are unremarkable. Benign exophytic cyst of the inferior pole of the left kidney. Kidneys are otherwise normal, without renal calculi, solid lesion, or hydronephrosis. There is an elongated, 1.4 cm calculus in or near the prostatic urethra (series 6, image 86). Bladder is unremarkable.   Stomach/Bowel: Stomach is within normal limits. Appendix appears normal. No evidence of bowel wall thickening, distention, or inflammatory changes.   Vascular/Lymphatic: Aortic atherosclerosis. No enlarged abdominal or pelvic lymph nodes.   Reproductive: No mass or other significant abnormality.   Other: No abdominal wall hernia or abnormality. No abdominopelvic ascites.   Musculoskeletal: No acute or significant osseous findings.   IMPRESSION: 1. No acute CT findings of the abdomen or pelvis to explain bloating. 2. There is an elongated, 1.4 cm calculus in or near the prostatic urethra. Correlate for bladder outlet obstruction. No evidence of additional urinary tract calculus or hydronephrosis. 3. Cholelithiasis without evidence of acute cholecystitis. 4. Coronary artery disease.   Aortic Atherosclerosis (ICD10-I70.0).     Electronically Signed   By: AEddie CandleM.D.   On: 01/13/2021 10:04  I have independently reviewed the films.  See HPI.     Assessment & Plan:    1. Frequency -Explained that the oxybutynin has a side effect of constipation and since he has been prescribed Linzess for constipation issues I advised him not to have that medication refilled -I given him Gemtesa 75 mg, #28 samples to take at this time while awaiting the results of the cystoscopy -Explained to the patient that the calcification seen on CT scan may be the cause of some bladder irritation contributing to his urinary frequency, but we would know more once the cystoscopy is completed -  I have explained  to the patient that they will  be scheduled for a cystoscopy in our office to evaluate their bladder.  The cystoscopy consists of passing a tube with a lens up through their urethra and into their urinary bladder.   We will inject the urethra with a lidocaine gel prior to introducing the cystoscope to help with any discomfort during the procedure.   After the procedure, they might experience blood in the urine and discomfort with urination.  This will abate after the first few voids.  I have  encouraged the patient to increase water intake  during this time.  Patient denies any allergies to lidocaine.   -schedule cystoscopy  2. Urethral calcification -Elongated greater 1 cm calculus is in or near the prostatic urethra on recent CT scan -Patient be scheduled for an office cystoscopy for further evaluation of the CT finding  3. BPH with LU TS -s/p HoLEP   Return for cystoscopy for frequency/urethral calcification .  These notes generated with voice recognition software. I apologize for typographical errors.  Zara Council, PA-C  [facility] [address]  [place], [place] [address] [phone]

## 2021-02-02 ENCOUNTER — Encounter: Payer: Self-pay | Admitting: Urology

## 2021-02-02 ENCOUNTER — Ambulatory Visit: Payer: Medicare HMO | Admitting: Urology

## 2021-02-02 ENCOUNTER — Other Ambulatory Visit: Payer: Self-pay

## 2021-02-02 VITALS — BP 130/73 | HR 82 | Ht 64.0 in | Wt 168.0 lb

## 2021-02-02 DIAGNOSIS — N401 Enlarged prostate with lower urinary tract symptoms: Secondary | ICD-10-CM

## 2021-02-02 DIAGNOSIS — R350 Frequency of micturition: Secondary | ICD-10-CM

## 2021-02-02 DIAGNOSIS — N138 Other obstructive and reflux uropathy: Secondary | ICD-10-CM

## 2021-02-02 DIAGNOSIS — N211 Calculus in urethra: Secondary | ICD-10-CM | POA: Diagnosis not present

## 2021-02-02 LAB — URINALYSIS, COMPLETE
Bilirubin, UA: NEGATIVE
Glucose, UA: NEGATIVE
Ketones, UA: NEGATIVE
Leukocytes,UA: NEGATIVE
Nitrite, UA: NEGATIVE
Protein,UA: NEGATIVE
Specific Gravity, UA: 1.02 (ref 1.005–1.030)
Urobilinogen, Ur: 0.2 mg/dL (ref 0.2–1.0)
pH, UA: 6.5 (ref 5.0–7.5)

## 2021-02-02 LAB — BLADDER SCAN AMB NON-IMAGING

## 2021-02-02 LAB — MICROSCOPIC EXAMINATION
Bacteria, UA: NONE SEEN
Epithelial Cells (non renal): NONE SEEN /hpf (ref 0–10)

## 2021-02-02 MED ORDER — GEMTESA 75 MG PO TABS: 75.0000 mg | ORAL_TABLET | Freq: Every day | ORAL | 0 refills | Status: DC

## 2021-02-08 LAB — CULTURE, URINE COMPREHENSIVE

## 2021-02-17 ENCOUNTER — Other Ambulatory Visit: Payer: Self-pay | Admitting: Physician Assistant

## 2021-02-21 DIAGNOSIS — Z23 Encounter for immunization: Secondary | ICD-10-CM | POA: Diagnosis not present

## 2021-02-23 ENCOUNTER — Other Ambulatory Visit: Payer: Self-pay | Admitting: Physician Assistant

## 2021-03-02 ENCOUNTER — Other Ambulatory Visit: Payer: Medicare HMO | Admitting: Urology

## 2021-03-03 ENCOUNTER — Ambulatory Visit (INDEPENDENT_AMBULATORY_CARE_PROVIDER_SITE_OTHER): Payer: Medicare HMO | Admitting: Urology

## 2021-03-03 ENCOUNTER — Encounter: Payer: Self-pay | Admitting: Urology

## 2021-03-03 ENCOUNTER — Other Ambulatory Visit: Payer: Self-pay | Admitting: Urology

## 2021-03-03 ENCOUNTER — Other Ambulatory Visit: Payer: Self-pay

## 2021-03-03 ENCOUNTER — Telehealth: Payer: Self-pay | Admitting: Urology

## 2021-03-03 VITALS — BP 143/57 | HR 85 | Wt 166.0 lb

## 2021-03-03 DIAGNOSIS — R3914 Feeling of incomplete bladder emptying: Secondary | ICD-10-CM

## 2021-03-03 DIAGNOSIS — N210 Calculus in bladder: Secondary | ICD-10-CM

## 2021-03-03 NOTE — Progress Notes (Signed)
Cystoscopy Procedure Note:  Indication: Dysuria, OAB symptoms, history of HOLEP for elevated PVRs and incomplete emptying in May 2021.  Recent CT showing possible calcification in the prostatic urethra  After informed consent and discussion of the procedure and its risks, PADRAIG NHAN was positioned and prepped in the standard fashion. Cystoscopy was performed with a flexible cystoscope. The urethra, bladder neck and entire bladder was visualized in a standard fashion. The prostate was opened consider accident with prior HOLEP, and there is a 1.5 cm stone lodged in the bladder neck.  This was mobile and was able to be pushed back into the bladder. The ureteral orifices were visualized in their normal location and orientation.  Bladder mucosa otherwise grossly normal, stone visualized on retroflexion  Findings: 1.5 cm bladder stone  -------------------------------------------------------------------------  Assessment and Plan: 87 year old male with history of incomplete bladder emptying and elevated PVRs greater than 250 mL who underwent an uncomplicated HOLEP in May 2021 with excellent results and normal PVRs with relief of his urinary symptoms.  His only complaint now is some occasional dysuria and urinary frequency.  Cystoscopy shows a 1.5 cm bladder stone.  We discussed options including observation, cystolitholapaxy, or cystolitholapaxy and resection of any residual tissue.  He has a nice open channel, and I recommended cystolitholapaxy alone.  Risk and benefits discussed at length.  Schedule cystolitholapaxy  Nickolas Madrid, MD 03/03/2021

## 2021-03-03 NOTE — Patient Instructions (Signed)
Bladder Stone A bladder stone is a buildup of crystals made from the proteins and minerals found in urine. These substances build up when urine becomes too concentrated. Urine is concentrated when there is less water and more proteins and minerals in it. Bladder stones usually develop when a person has another medical condition that prevents the bladder from emptying completely. Crystals can form in the small amount of urine that is left in the bladder. Bladder stones that grow large can become painful and may block the flow of urine. What are the causes? This condition may be caused by: An enlarged prostate, which prevents the bladder from emptying well. An infection of a part of your urinary system (urinary tract infection, or UTI). This includes the: Kidneys. Bladder. Ureters. These are the tubes that carry urine to your bladder. Urethra. This is the tube that drains urine from your bladder. A weak spot in the bladder that creates a small pouch (bladder diverticulum). Nerve damage that may interfere with the signals from your brain to your bladder muscles (neurogenic bladder). This can result from conditions such as Parkinson's disease or spinal cord injuries. What increases the risk? This condition is more likely to develop in people who: Get frequent UTIs. Have another medical condition that affects the bladder. Have a history of bladder surgery. Have a spinal cord injury. Have an abnormal shape of the bladder (deformity). What are the signs or symptoms? Common symptoms of this condition include: Pain in the abdomen. A need to urinate more often. Difficulty or pain when urinating. Blood in the urine. Cloudy urine or urine that is dark in color. Pain in the penis or testicles in men. Small bladder stones do not always cause symptoms. How is this diagnosed? This condition may be diagnosed based on your symptoms, medical history, and physical exam. The physical exam will check for  tenderness in your abdomen. For men, an exam in the rectum may be done to check the prostate gland. You may have tests, such as: A urine test (urinalysis). A urine sample test to check for other infections (culture). Blood tests, including tests to look for a certain substance (creatinine). A creatinine level that is higher than normal could indicate a blockage. A procedure to check your bladder using a scope with a camera (cystoscopy). You may also have imaging studies, such as: CT scan or ultrasound of your abdomen and the area between your hip bones (pelvis or pelvic area). An X-ray of your urinary system. How is this treated? This condition may be treated with: Cystolitholapaxy. This procedure uses a laser, ultrasound, or other device to break the stone into smaller pieces. Fluids are used to flush the small pieces from the area. Surgery to remove the stone. A stent. This is a small mesh tube that is threaded into your ureter to make urine flow. Medicines to treat pain. Follow these instructions at home: Medicines Take over-the-counter and prescription medicines only as told by your health care provider. Ask your health care provider if the medicine prescribed to you: Requires you to avoid driving or using heavy machinery. Can cause constipation. You may need to take these actions to prevent or treat constipation: Take over-the-counter or prescription medicines. Eat foods that are high in fiber, such as beans, whole grains, and fresh fruits and vegetables. Limit foods that are high in fat and processed sugars, such as fried or sweet foods. Alcohol use Do not drink alcohol if: Your health care provider tells you not to drink.  You are pregnant, may be pregnant, or are planning to become pregnant. If you drink alcohol: Limit how much you drink to: 0-1 drink a day for women. 0-2 drinks a day for men. Be aware of how much alcohol is in your drink. In the [place] one drink equals one 12  oz bottle of beer (355 mL), one 5 oz glass of wine (148 mL), or one 1 oz glass of hard liquor (44 mL). Activity Rest as told by your health care provider. Return to your normal activities as told by your health care provider. Ask your health care provider what activities are safe for you. General instructions  Drink enough fluid to keep your urine pale yellow. Tell your health care provider about any unusual symptoms related to urinating. Early diagnosis of an enlarged prostate and other bladder conditions may reduce your risk of getting bladder stones. Do not use any products that contain nicotine or tobacco, such as cigarettes, e-cigarettes, or chewing tobacco. If you need help quitting, ask your health care provider. Do not use drugs. Where to find more information [facility]): [email or web address] Contact a health care provider if you: Have a fever. Feel nauseous or vomit. Are unable to urinate. Have a large amount of blood in your urine. Get help right away if you: Have severe back pain or pain in the lower part of your abdomen. Cannot eat or drink without vomiting. Vomit after taking your medicine. Summary A bladder stone is a buildup of crystals made from the proteins and minerals found in urine. These substances build up when urine becomes too concentrated. Bladder stones that grow large can become painful and may block the flow of urine. Bladder stones may be treated with a laser, a stent, surgery, or pain medicines. This information is not intended to replace advice given to you by your health care provider. Make sure you discuss any questions you have with your health care provider. Document Revised: 11/28/2018 Document Reviewed: 11/28/2018 Elsevier Patient Education  [place]. ????  ?? ??? ???? ???? ???? ???? ???? ??????. ??? ??? ??? ?? ??? ? ?????. ??? ??? ?? ???? ???? ?? ? ?????. ?? ??? ????? ??? ??? ???? ?? ?? ??? ??? ?? ? ?????. ??? ??? ??  ?? ??? ???? ??? ? ????. ??? ?? ??? ??? ???? ??? ??? ??? ? ????. ??? ?????? ? ??? ???? ?? ??? ? ????. 1. ??? ? ???? ?? ??? ???. 2. ???? ??? ??(?? ?? ?? UTI). ???? ??? ?????. o ??. o ??. o ??. ??? ??? ???? ???? ????. o ??. ??? ???? ??? ???? ????. 3. ?? ???(?? ??)? ??? ??? ?? ??. 4. ??? ?? ???? ??? ??? ??? ? ?? ?? ??(??? ??). ??? ?????? ?? ??? ?? ??? ?? ??? ? ????. ??? ??? ??????? ? ??? ??? ?? ?????? ??? ???? ? ????.  UTI? ?? ????.  ??? ??? ??? ?? ??? ?? ??.  ?? ??? ??? ????.  ?? ??? ????.  ??? ??? ???????(??). ?? ?? ??? ?????? ? ??? ???? ??? ??? ????.  ?? ??.  ? ?? ??? ? ??? ????.  ?? ? ????? ??.  ??? ??? ????.  ?? ?? ?? ??? ??? ??.  ??? ?? ???? ??? ??. ?? ?? ??? ?? ??? ???? ?? ????. ??? ??? ?????? ? ??? ??, ?? ? ?? ??? ???? ??? ? ????. ?? ??? ??? ??? ??? ????. ??? ?? ?? ??? ?? ???? ??? ? ????. 1. ??? ?? ??? ?? ? ????.  o ?? ??(?? ??). o ?? ??(??)? ???? ?? ?? ?? ??. o ?? ??(?????)? ?? ?? ??? ??? ?? ??. ???? ?? ????? ??? ??? ??? ? ????. o ???? ?? ???? ???? ??? ???? ??(??? ??). 2. ??? ?? ?? ??? ?? ?? ????. o ?? ? ??? ? ?? ??(?? ?? ?? ??)? CT ?? ?? ???. o ????? ????. ??? ??? ?????? ? ??? ???? ??? ? ????.  ??????. ? ??? ???, ??? ?? ?? ??? ???? ?? ? ?? ???? ??? ????. ??? ?? ???? ?? ??? ???? ? ?????.  ??? ???? ??.  ???. ??? ?? ??? ??? ?? ??? ???? ?? ?? ?????.  ?? ??? ?? ???. ??? ?? ??? ?????. ? 1. ?? ??? ???? ??? ??? ????? ? ???? ??????. 2. ???? ??? ?? ??? ?? ?? ?? ????? ??????. 1. ???? ??? ??? ??? ???. 2. ??? ??? ? ????. ??? ????? ????? ?? ??? ??? ? ? ????. ? ????? ?? ???? ??????. ? ?, ???, ??? ?? ? ??? ?? ???? ?? ??? ??????. ? ?? ???? ? ??? ?? ??? ?? ??? ?? ??? ??????. ??? ?? 1. ??? ?? ???? ?? ??? ????. 1. ?? ???? ?? ??? ??? ???. 2. ?? ???? ?? ???? ??? ?? ????. 2. ?? ??? ??: 1. ???? ??? ?? ??????. ? ??? ?? ?? 0-1?. ? ??? ?? ?? 0~2?. 2. ?? ??? ?? ???? ?? ??? ??????. ???? ?? 1?? 12?? ?? ? ?(349mL), 5?? ?? ? ?(114mL) ?? 1?? ?? ??(56mL)? ????. ??  ?? ???? ??? ??  ??? ?????.  ?? ??? ???? ??? ?? ???? ???? ??????. ?? ??? ???? ?? ????? ??????. ?? ??    ??? ?? ???? ??? ??? ??? ??????.  ??? ??? ????? ??? ?? ?? ??? ????? ?????. ??? ??? ? ?? ?? ??? ??? ???? ?? ??? ?? ??? ?? ? ????.  ??, ?? ?? ?? ?? ??? ?? ????? ??? ??? ??? ???? ????. ??? ??? ???? ?? ????? ??????.  ??? ???? ????. ? ?? ??? ?? ? ?? ? ???? ?? ??([facility]): [email or web address] ??? ?? ?? ?? ??? ????? ??????.  ?? ??.  ????? ???? ??? ??.  ??? ? ? ????.  ??? ?? ?? ??? ????. ??? ?? ?? ?? ??? ????.  ???

## 2021-03-03 NOTE — Progress Notes (Signed)
Surgical Physician Order Form  * Scheduling expectation : Next Available  *Length of Case: 30 minutes  *Clearance needed: no  *Anticoagulation Instructions: Hold all anticoagulants  *Aspirin Instructions: Hold Plavix ok to continue ASA  *Post-op visit Date/Instructions:  6 month follow up PVR with MD  *Diagnosis: Bladder Stone  *Procedure: Cystolitholapaxy <2.5cm (12751)  -Admit type: OUTpatient  -Anesthesia: General  -VTE Prophylaxis Standing Order SCD's       Other:   -Standing Lab Orders Per Anesthesia    Lab other: UA&Urine Culture  -Standing Test orders EKG/Chest x-ray per Anesthesia       Test other:   - Medications:     Ancef 2gm IV   Other Instructions:

## 2021-03-03 NOTE — Progress Notes (Signed)
Interpreter [phone]

## 2021-03-03 NOTE — Telephone Encounter (Signed)
Per Dr. Diamantina Providence  Patient is to be scheduled for Cystolitholapaxy  Mr. Dershem  was seen by surgery coordinator in office with virtual interpreter and possible surgical dates were discussed, 03/18/21 was agreed upon for surgery.  Patient was directed to call [phone] between 1-3pm the day before surgery to find out surgical arrival time.  Instructions were given not to eat or drink from midnight on the night before surgery and have a driver for the day of surgery. On the surgery day patient was instructed to enter through the [place] entrance of [facility] report the Same Day Surgery desk.   Pre-Admit Testing will be in contact via phone to set up an interview with the anesthesia team to review your history and medications prior to surgery.   Reminder of this information was given to the patient during his visit today.  Patient is to hold anticoag's ok to continue ASA per Dr. Diamantina Providence.  Currently taking Plavix.

## 2021-03-04 NOTE — Progress Notes (Signed)
[facility] Posting Form   Surgery Date/Time: Date: 03/18/2021  Surgeon: Dr. Nickolas Madrid, MD  Surgery Location: Day Surgery  Inpt ( No  )   Outpt (Yes)   Obs ( No  )   Diagnosis: N21.0 Bladder Stone  -CPT: [phone]  Surgery: Cystolitholapaxy  Stop Anticoagulations: Yes; Stop Plavix, can continue ASA  Cardiac/Medical/Pulmonary Clearance needed: none  *Orders entered into EPIC  Date: 03/04/21   *Case booked in [place]  Date: 03/03/2021  *Notified pt of Surgery: Date: 03/03/21  PRE-OP UA & CX: Yes, Obtain at Pre-admit visit  *Placed into Prior Authorization Work Que Date: 03/04/21   Assistant/laser/rep:No

## 2021-03-11 ENCOUNTER — Other Ambulatory Visit: Payer: Medicare HMO

## 2021-03-14 ENCOUNTER — Other Ambulatory Visit: Payer: Self-pay

## 2021-03-14 ENCOUNTER — Other Ambulatory Visit
Admission: RE | Admit: 2021-03-14 | Discharge: 2021-03-14 | Disposition: A | Discharge status: Home / Self Care | Payer: Medicare HMO | Source: Ambulatory Visit | Attending: Urology | Admitting: Urology

## 2021-03-14 ENCOUNTER — Encounter: Payer: Self-pay | Admitting: Urology

## 2021-03-14 VITALS — Ht 66.0 in | Wt 166.0 lb

## 2021-03-14 DIAGNOSIS — N210 Calculus in bladder: Secondary | ICD-10-CM | POA: Diagnosis not present

## 2021-03-14 DIAGNOSIS — Z01818 Encounter for other preprocedural examination: Secondary | ICD-10-CM | POA: Diagnosis not present

## 2021-03-14 DIAGNOSIS — I25718 Atherosclerosis of autologous vein coronary artery bypass graft(s) with other forms of angina pectoris: Secondary | ICD-10-CM | POA: Diagnosis not present

## 2021-03-14 DIAGNOSIS — Z01810 Encounter for preprocedural cardiovascular examination: Secondary | ICD-10-CM | POA: Diagnosis not present

## 2021-03-14 DIAGNOSIS — I10 Essential (primary) hypertension: Secondary | ICD-10-CM | POA: Diagnosis not present

## 2021-03-14 HISTORY — DX: Other constipation: K59.09

## 2021-03-14 HISTORY — DX: Trigger finger, right middle finger: M65.331

## 2021-03-14 LAB — URINALYSIS, COMPLETE (UACMP) WITH MICROSCOPIC
Bacteria, UA: NONE SEEN
Bilirubin Urine: NEGATIVE
Glucose, UA: NEGATIVE mg/dL
Hgb urine dipstick: NEGATIVE
Ketones, ur: NEGATIVE mg/dL
Leukocytes,Ua: NEGATIVE
Nitrite: NEGATIVE
Protein, ur: NEGATIVE mg/dL
Specific Gravity, Urine: 1.013 (ref 1.005–1.030)
Squamous Epithelial / HPF: NONE SEEN (ref 0–5)
pH: 7 (ref 5.0–8.0)

## 2021-03-14 LAB — CBC
HCT: 44.1 % (ref 39.0–52.0)
Hemoglobin: 15.3 g/dL (ref 13.0–17.0)
MCH: 33.5 pg (ref 26.0–34.0)
MCHC: 34.7 g/dL (ref 30.0–36.0)
MCV: 96.5 fL (ref 80.0–100.0)
Platelets: 208 10*3/uL (ref 150–400)
RBC: 4.57 MIL/uL (ref 4.22–5.81)
RDW: 12.6 % (ref 11.5–15.5)
WBC: 5.8 10*3/uL (ref 4.0–10.5)
nRBC: 0 % (ref 0.0–0.2)

## 2021-03-14 LAB — BASIC METABOLIC PANEL
Anion gap: 6 (ref 5–15)
BUN: 17 mg/dL (ref 8–23)
CO2: 32 mmol/L (ref 22–32)
Calcium: 9.5 mg/dL (ref 8.9–10.3)
Chloride: 98 mmol/L (ref 98–111)
Creatinine, Ser: 0.69 mg/dL (ref 0.61–1.24)
GFR, Estimated: >60 mL/min (ref >60)
Glucose, Bld: 95 mg/dL (ref 70–99)
Potassium: 3.7 mmol/L (ref 3.5–5.1)
Sodium: 136 mmol/L (ref 135–145)

## 2021-03-14 NOTE — Patient Instructions (Addendum)
Your procedure is scheduled on: Friday, October 28 Report to the Registration Desk on the 1st floor of the [facility]. To find out your arrival time, please call [phone] between 1PM - 3PM on: Thursday, October 27  REMEMBER: Instructions that are not followed completely may result in serious medical risk, up to and including death; or upon the discretion of your surgeon and anesthesiologist your surgery may need to be rescheduled.  Do not eat or drink after midnight the night before surgery.  No gum chewing, lozengers or hard candies.  DO NOT TAKE ANY MEDICATIONS THE MORNING OF SURGERY  Follow recommendations from Cardiologist or PCP regarding stopping Plavix. Per secure chat with Dr. Diamantina Providence, last day to take Plavix is today, Monday, October 24. Resume AFTER surgery per surgeon's instructions.  One week prior to surgery: Stop Anti-inflammatories (NSAIDS) such as Advil, Aleve, Ibuprofen, Motrin, Naproxen, Naprosyn and Aspirin based products such as Excedrin, Goodys Powder, BC Powder. Stop ANY OVER THE COUNTER supplements until after surgery. STOP MULTIPLE VITAMIN, FISH OIL You may however, continue to take Tylenol if needed for pain up until the day of surgery.  No Alcohol for 24 hours before or after surgery.  On the morning of surgery brush your teeth with toothpaste and water, you may rinse your mouth with mouthwash if you wish. Do not swallow any toothpaste or mouthwash.  Do not wear jewelry.  Do not wear lotions, powders, or perfumes.   Do not shave body from the neck down 48 hours prior to surgery just in case you cut yourself which could leave a site for infection.   Contact lenses, hearing aids and dentures may not be worn into surgery.  Do not bring valuables to the hospital. [facility] is not responsible for any missing/lost belongings or valuables.   Notify your doctor if there is any change in your medical condition (cold, fever, infection).  Wear comfortable  clothing (specific to your surgery type) to the hospital.  After surgery, you can help prevent lung complications by doing breathing exercises.  Take deep breaths and cough every 1-2 hours. Your doctor may order a device called an Incentive Spirometer to help you take deep breaths.  If you are being discharged the day of surgery, you will not be allowed to drive home. You will need a responsible adult (18 years or older) to drive you home and stay with you that night.   If you are taking public transportation, you will need to have a responsible adult (18 years or older) with you. Please confirm with your physician that it is acceptable to use public transportation.   Please call the [facility] Dept. at [phone] if you have any questions about these instructions.  Surgery Visitation Policy:  Patients undergoing a surgery or procedure may have one family member or support person with them as long as that person is not COVID-19 positive or experiencing its symptoms.  That person may remain in the waiting area during the procedure and may rotate out with other people.

## 2021-03-15 ENCOUNTER — Encounter: Payer: Self-pay | Admitting: Urology

## 2021-03-15 LAB — URINE CULTURE: Culture: <10000 — AB

## 2021-03-15 NOTE — Progress Notes (Signed)
Perioperative Services  Pre-Admission/Anesthesia Testing Clinical Review  Date: 03/15/21  Patient Demographics:  Name: Timothy Bass DOB:   1932/08/25 MRN:   [id number]  Planned Surgical Procedure(s):    Case: [id number] Date/Time: 03/18/21 1301   Procedure: CYSTOSCOPY WITH LITHOLAPAXY   Anesthesia type: General   Pre-op diagnosis: Bladder Stone   Location: [facility]   Surgeons: Billey Co, MD     NOTE: Available PAT nursing documentation and vital signs have been reviewed. Clinical nursing staff has updated patient's PMH/PSHx, current medication list, and drug allergies/intolerances to ensure comprehensive history available to assist in medical decision making as it pertains to the aforementioned surgical procedure and anticipated anesthetic course. Extensive review of available clinical information performed. [facility] [facility] and PSHx updated with any diagnoses/procedures that  may have been inadvertently omitted during his intake with the pre-admission testing department's nursing staff.  Clinical Discussion:  Timothy Bass is a 87 y.o. male who is submitted for pre-surgical anesthesia review and clearance prior to him undergoing the above procedure. Patient is a Former Smoker (30 pack years; quit 05/1993). Pertinent PMH includes: CAD (s/p CABG), MI, RBBB, PVD, aortic atherosclerosis, HTN, HLD, prediabetes, DOE, GERD (no daily Tx), BPH with LUTS, nephrolithiasis.  Patient is followed by cardiology Nehemiah Massed, MD). He was last seen in the cardiology clinic on 09/09/2020; notes reviewed. At the time of his clinic visit, the patient denied any chest pain, PND, orthopnea, palpitations, significant peripheral edema, vertiginous symptoms, or presyncope/syncope. He did report some mild intermittent exertional dyspnea.  Patient with a PMH significant for cardiovascular diagnoses.  Patient suffered an acute MI in 1995.  Records surrounding events leading to  cardiac event unavailable for review at time of consult.  Information obtained from patient's current primary cardiologist.  Following MI, patient ultimately underwent a three-vessel CABG procedure.  LIMA-LAD, SVG-D1, and SVG-dLCX bypass grafts were placed.  Patient with recurrent [place] class IV anginal symptoms with multiple cardiac risk factors. He underwent diagnostic left heart catheterization on 01/05/2016 revealing multivessel CAD; 100% proximal LAD, 100% D1, 100% distal LAD, 30% proximal to mid LCx, 65% distal LCx-1, 100% distal LCx-2, and 95% distal graft lesion.  LIMA to LAD bypass graft atretic.  SVG-D1 bypass graft patent.  There was significant stenosis of the SVG-PDA graft.  PCI was performed whereby a 2.75 x 12 mm Resolute Integrity DES x1 was placed to the distal SVG-PDA graft resulting in 0% residual stenosis and restoration of TIMI-3 flow.  Following stent placement, and secondary to patient's PVD, patient remains on daily antiplatelet therapy using clopidogrel; compliant with therapy with no evidence of GI bleeding.  Blood pressure mildly elevated at 140/78 on currently prescribed CCB and ACEi therapies.  Patient is on a statin + omega-3 fatty acid for his HLD.  Patient is prediabetic; last Hgb A1c was 6.0% when checked on 10/11/2020. Functional capacity, as defined by DASI, is documented as being >/= 4 METS. No changes were made to his medication regimen.  Patient follow-up with outpatient cardiology in 9 months or sooner if needed.  Timothy Bass is scheduled for an CYSTOSCOPY WITH LITHOLAPAXY on 03/18/2021 with Dr. Nickolas Madrid, MD. Given patient's past medical history significant for cardiovascular diagnoses, presurgical cardiac clearance was sought by the PAT team. Per cardiology, "this patient is optimized for surgery and may proceed with the planned procedural course with a LOW risk of significant perioperative cardiovascular complications".  Again, this patient is on daily  antiplatelet therapy. He has been instructed on recommendations for holding his clopidogrel for 3 days prior to his procedure with plans to restart as soon as postoperative bleeding risk felt to be minimized by his attending surgeon. The patient has been instructed that his last dose of his anticoagulant will be on 03/14/2021.  Patient denies previous perioperative complications with anesthesia in the past. In review of the available records, it is noted that patient underwent a general anesthetic course here (ASA III) in 09/2019 without documented complications. Please note, patient speaks [place] only and will require the assistance of a medical interpreter for all interactions with medical/ancillary staff during his time as an inpatient and throughout his subsequent care as an outpatient.   Vitals with BMI 03/14/2021 03/03/2021 02/02/2021  Height 5\' 6"  - 5\' 4"   Weight 166 lbs 166 lbs 168 lbs  BMI 26.81 48.18 56.31  Systolic - 497 026  Diastolic - 57 73  Pulse - 85 82    Providers/Specialists:   NOTE: Primary physician provider listed below. Patient may have been seen by APP or partner within same practice.   PROVIDER ROLE / SPECIALTY LAST Ranae Pila, MD UROLOGY (SURGEON) 03/03/2021  Juluis Pitch, MD PRIMARY CARE PROVIDER 01/10/2021  Serafina Royals, MD CARDIOLOGY 09/09/2020   Allergies:  Peanuts [peanut oil]  Current Home Medications:   No current facility-administered medications for this encounter.    amLODipine-benazepril (LOTREL) 5-10 MG capsule   atorvastatin (LIPITOR) 80 MG tablet   bisacodyl (DULCOLAX) 5 MG EC tablet   clopidogrel (PLAVIX) 75 MG tablet   Hydrocortisone, Perianal, 1 % CREA   LINZESS 145 MCG CAPS capsule   Multiple Vitamins-Minerals (ONE-A-DAY MENS 50+ PO)   Omega-3 Fatty Acids (OMEGA-3 FISH OIL PO)   polyethylene glycol (MIRALAX / GLYCOLAX) 17 g packet   Psyllium (METAMUCIL FIBER PO)   senna (SENOKOT) 8.6 MG TABS tablet   History:    Past Medical History:  Diagnosis Date   Acquired trigger finger of both middle fingers    Aortic atherosclerosis (HCC)    Arthritis    BPH without obstruction/lower urinary tract symptoms    Cholelithiasis    Chronic constipation    Coronary artery disease    a.) 3v CABG (LIMA-LAD, SVG-D1, SVG-dLCx) in 1995. b.) LHC 01/05/2016: EF 55%; 100% pLAD, 100% oD1, 100% dLAD, 30% p-mLCx, 65% dLCx-1, 100% dLCx-2, 95% distal graft lesion. Atretic LIMA-LAD; patent SVG-D1; sig disease SVG-PDA. 2.75 x 12 mm Resolute Integrity DES x 1 to dSVG-PDA graft.   DOE (dyspnea on exertion)    Dysuria    GERD (gastroesophageal reflux disease)    History of 2019 novel coronavirus disease (COVID-19) 05/28/2020   Hypercholesteremia    Hyperlipidemia    Hypertension    Long term current use of antithrombotics/antiplatelets    a.) Clopidogrel   Myocardial infarction ([place]) 1995   Nephrolithiasis    Nocturia    Prediabetes    Prostatic hypertrophy    PVD (peripheral vascular disease) (HCC)    RBBB (right bundle branch block)    S/P CABG x 3 1995   a.) 3v CABG (LIMA-LAD, SVG-D1, SVG-dLCx)   Urinary frequency    Urinary incontinence    Past Surgical History:  Procedure Laterality Date   CARDIAC CATHETERIZATION N/A 01/05/2016   Procedure: LEFT HEART CATH AND CORS/GRAFTS ANGIOGRAPHY;  Surgeon: Corey Skains, MD;  Location: [facility];  Service: Cardiovascular;  Laterality: N/A;   CARDIAC CATHETERIZATION N/A 01/05/2016   Procedure: Coronary  Stent Intervention (2.75 x 12 mm Resolute Intregity DES x1 to dSVG-PDA graft);  Surgeon: Isaias Cowman, MD;  Location: [facility];  Service: Cardiovascular;  Laterality: N/A;   COLONOSCOPY WITH PROPOFOL N/A 07/02/2017   Procedure: COLONOSCOPY WITH PROPOFOL;  Surgeon: Lollie Sails, MD;  Location: [facility];  Service: Endoscopy;  Laterality: N/A;   CORONARY ARTERY BYPASS GRAFT  1995   Procedure: 3v CABG (LIMA-LAD, SVG-D1, SVG-dLCx)    ESOPHAGOGASTRODUODENOSCOPY (EGD) WITH PROPOFOL N/A 05/27/2015   Procedure: ESOPHAGOGASTRODUODENOSCOPY (EGD) WITH PROPOFOL;  Surgeon: Hulen Luster, MD;  Location: [facility] ENDOSCOPY;  Service: Gastroenterology;  Laterality: N/A;   HOLEP-LASER ENUCLEATION OF THE PROSTATE WITH MORCELLATION N/A 10/17/2019   Procedure: HOLEP-LASER ENUCLEATION OF THE PROSTATE WITH MORCELLATION;  Surgeon: Billey Co, MD;  Location: [facility];  Service: Urology;  Laterality: N/A;   VASECTOMY     Family History  Family history unknown: Yes   Social History   Tobacco Use   Smoking status: Former    Packs/day: 1.50    Years: 20.00    Pack years: 30.00    Types: Cigarettes    Quit date: 05/22/1993    Years since quitting: 27.8   Smokeless tobacco: Never  Vaping Use   Vaping Use: Never used  Substance Use Topics   Alcohol use: No    Alcohol/week: 0.0 standard drinks   Drug use: No    Pertinent Clinical Results:  LABS: Labs reviewed: Acceptable for surgery.  Hospital Outpatient Visit on 03/14/2021  Component Date Value Ref Range Status   Sodium 03/14/2021 136  135 - 145 mmol/L Final   Potassium 03/14/2021 3.7  3.5 - 5.1 mmol/L Final   Chloride 03/14/2021 98  98 - 111 mmol/L Final   CO2 03/14/2021 32  22 - 32 mmol/L Final   Glucose, Bld 03/14/2021 95  70 - 99 mg/dL Final   Glucose reference range applies only to samples taken after fasting for at least 8 hours.   BUN 03/14/2021 17  8 - 23 mg/dL Final   Creatinine, Ser 03/14/2021 0.69  0.61 - 1.24 mg/dL Final   Calcium 03/14/2021 9.5  8.9 - 10.3 mg/dL Final   GFR, Estimated 03/14/2021 >60  >60 mL/min Final   Comment: (NOTE) Calculated using the CKD-EPI Creatinine Equation (2021)   Anion gap 03/14/2021 6  5 - 15 Final   Performed at [facility], [address] [place], [place] [address]   WBC 03/14/2021 5.8  4.0 - 10.5 K/uL Final   RBC 03/14/2021 4.57  4.22 - 5.81 MIL/uL Final   Hemoglobin 03/14/2021 15.3  13.0 - 17.0 g/dL Final   HCT  03/14/2021 44.1  39.0 - 52.0 % Final   MCV 03/14/2021 96.5  80.0 - 100.0 fL Final   MCH 03/14/2021 33.5  26.0 - 34.0 pg Final   MCHC 03/14/2021 34.7  30.0 - 36.0 g/dL Final   RDW 03/14/2021 12.6  11.5 - 15.5 % Final   Platelets 03/14/2021 208  150 - 400 K/uL Final   nRBC 03/14/2021 0.0  0.0 - 0.2 % Final   Performed at [facility], [address] [place], [place] [address]   Color, Urine 03/14/2021 YELLOW  YELLOW Final   APPearance 03/14/2021 CLEAR  CLEAR Final   Specific Gravity, Urine 03/14/2021 1.013  1.005 - 1.030 Final   pH 03/14/2021 7.0  5.0 - 8.0 Final   Glucose, UA 03/14/2021 NEGATIVE  NEGATIVE mg/dL Final   Hgb urine dipstick 03/14/2021 NEGATIVE  NEGATIVE Final   Bilirubin Urine 03/14/2021 NEGATIVE  NEGATIVE Final   Ketones, ur 03/14/2021 NEGATIVE  NEGATIVE mg/dL Final   Protein, ur 03/14/2021 NEGATIVE  NEGATIVE mg/dL Final   Nitrite 03/14/2021 NEGATIVE  NEGATIVE Final   Leukocytes,Ua 03/14/2021 NEGATIVE  NEGATIVE Final   Squamous Epithelial / LPF 03/14/2021 NONE SEEN  0 - 5 Final   WBC, UA 03/14/2021 0-5  0 - 5 WBC/hpf Final   RBC / HPF 03/14/2021 0-5  0 - 5 RBC/hpf Final   Bacteria, UA 03/14/2021 NONE SEEN  NONE SEEN Final   Performed at [facility], [address] [place], [place] [address]    ECG: Date: 03/14/2021 Time ECG obtained: 1412 PM Rate: 76 bpm Rhythm:  Sinus rhythm 1st degree AV block; RBBB Axis (leads I and aVF): Normal Intervals: PR 232 ms. QRS 152 ms. QTc 508 ms. ST segment and T wave changes: No evidence of acute ST segment elevation or depression Comparison: Similar to previous tracing obtained on 10/07/2019   IMAGING / PROCEDURES: CT ABDOMEN AND PELVIS WITH CONTRAST performed on 01/12/2021 No acute CT findings of the abdomen or pelvis to explain bloating. There is an elongated, 1.4 cm calculus in or near the prostatic urethra. Correlate for bladder outlet obstruction.  No evidence of additional urinary tract calculus or  hydronephrosis. Cholelithiasis without evidence of acute cholecystitis. Coronary artery disease. Aortic atherosclerosis  LEFT HEART CATHETERIZATION AND CORONARY ANGIOGRAPHY performed on 01/05/2016 LVEF 55% Multi-vessel CAD 100% stenosis proximal LAD 100% stenosis ostial D1 100% stenosis distal LAD 30% stenosis proximal to mid LCx 65% stenosis distal LCx-1 100% stenosis distal LCx-2 95% stenosis distal graft lesion Bypass grafts LIMA-LAD atretic SVG-D1 patent SVG-PDA significant with stenosis requiring further treatment Successful PCI 2.75 x 12 mm Resolute Integrity DES x1 placed to the distal SVG-PDA graft resulting in 0% residual stenosis and restoration of TIMI-3 flow   Impression and Plan:  Timothy Bass has been referred for pre-anesthesia review and clearance prior to him undergoing the planned anesthetic and procedural courses. Available labs, pertinent testing, and imaging results were personally reviewed by me. This patient has been appropriately cleared by cardiology with an overall LOW risk of significant perioperative cardiovascular complications.  Based on clinical review performed today (03/15/21), barring any significant acute changes in the patient's overall condition, it is anticipated that he will be able to proceed with the planned surgical intervention. Any acute changes in clinical condition may necessitate his procedure being postponed and/or cancelled. Patient will meet with anesthesia team (MD and/or CRNA) on the day of his procedure for preoperative evaluation/assessment. Questions regarding anesthetic course will be fielded at that time.   Pre-surgical instructions were reviewed with the patient during his PAT appointment and questions were fielded by PAT clinical staff. Patient was advised that if any questions or concerns arise prior to his procedure then he should return a call to PAT and/or his surgeon's office to discuss.  Honor Loh, MSN, APRN, FNP-C,  CEN [facility]  Peri-operative Services Nurse Practitioner Phone: [phone] Fax: [phone] 03/15/21 9:21 AM  NOTE: This note has been prepared using Dragon dictation software. Despite my best ability to proofread, there is always the potential that unintentional transcriptional errors may still occur from this process.

## 2021-03-17 MED ORDER — CEFAZOLIN SODIUM-DEXTROSE 2-4 GM/100ML-% IV SOLN
2.0000 g | INTRAVENOUS | Status: AC
  Administered 2021-03-18: 2 g via INTRAVENOUS

## 2021-03-17 MED ORDER — ORAL CARE MOUTH RINSE: 15.0000 mL | Freq: Once | OROMUCOSAL | Status: AC

## 2021-03-17 MED ORDER — LACTATED RINGERS IV SOLN
INTRAVENOUS | Status: DC
Start: 2021-03-17 — End: 2021-03-18

## 2021-03-17 MED ORDER — CHLORHEXIDINE GLUCONATE 0.12 % MT SOLN: 15.0000 mL | Freq: Once | OROMUCOSAL | Status: AC

## 2021-03-17 MED ORDER — FAMOTIDINE 20 MG PO TABS: 20.0000 mg | ORAL_TABLET | Freq: Once | ORAL | Status: AC

## 2021-03-18 ENCOUNTER — Other Ambulatory Visit: Payer: Self-pay

## 2021-03-18 ENCOUNTER — Ambulatory Visit
Admission: RE | Admit: 2021-03-18 | Discharge: 2021-03-18 | Disposition: A | Discharge status: Home / Self Care | Payer: Medicare HMO | Attending: Urology | Admitting: Urology

## 2021-03-18 ENCOUNTER — Encounter
Admission: RE | Disposition: A | Discharge status: Home / Self Care | Payer: Self-pay | Source: Home / Self Care | Attending: Urology

## 2021-03-18 ENCOUNTER — Ambulatory Visit: Payer: Medicare HMO | Admitting: Urgent Care

## 2021-03-18 ENCOUNTER — Encounter: Payer: Self-pay | Admitting: Urology

## 2021-03-18 DIAGNOSIS — Z955 Presence of coronary angioplasty implant and graft: Secondary | ICD-10-CM | POA: Diagnosis not present

## 2021-03-18 DIAGNOSIS — N401 Enlarged prostate with lower urinary tract symptoms: Secondary | ICD-10-CM | POA: Diagnosis not present

## 2021-03-18 DIAGNOSIS — Z951 Presence of aortocoronary bypass graft: Secondary | ICD-10-CM | POA: Insufficient documentation

## 2021-03-18 DIAGNOSIS — Z7902 Long term (current) use of antithrombotics/antiplatelets: Secondary | ICD-10-CM | POA: Diagnosis not present

## 2021-03-18 DIAGNOSIS — R7303 Prediabetes: Secondary | ICD-10-CM | POA: Insufficient documentation

## 2021-03-18 DIAGNOSIS — E7800 Pure hypercholesterolemia, unspecified: Secondary | ICD-10-CM | POA: Diagnosis not present

## 2021-03-18 DIAGNOSIS — Z8616 Personal history of COVID-19: Secondary | ICD-10-CM | POA: Diagnosis not present

## 2021-03-18 DIAGNOSIS — N210 Calculus in bladder: Secondary | ICD-10-CM | POA: Insufficient documentation

## 2021-03-18 DIAGNOSIS — Z87891 Personal history of nicotine dependence: Secondary | ICD-10-CM | POA: Diagnosis not present

## 2021-03-18 HISTORY — DX: Calculus of kidney: N20.0

## 2021-03-18 HISTORY — DX: Prediabetes: R73.03

## 2021-03-18 HISTORY — DX: Peripheral vascular disease, unspecified: I73.9

## 2021-03-18 HISTORY — DX: Unspecified right bundle-branch block: I45.10

## 2021-03-18 HISTORY — DX: Atherosclerosis of aorta: I70.0

## 2021-03-18 HISTORY — DX: Hyperlipidemia, unspecified: E78.5

## 2021-03-18 HISTORY — DX: Calculus of gallbladder without cholecystitis without obstruction: K80.20

## 2021-03-18 HISTORY — DX: Other forms of dyspnea: R06.09

## 2021-03-18 HISTORY — DX: Long term (current) use of antithrombotics/antiplatelets: Z79.02

## 2021-03-18 HISTORY — PX: CYSTOSCOPY WITH LITHOLAPAXY: SHX1425

## 2021-03-18 SURGERY — CYSTOSCOPY, WITH BLADDER CALCULUS LITHOLAPAXY
Anesthesia: General

## 2021-03-18 MED ORDER — ONDANSETRON HCL 4 MG/2ML IJ SOLN
INTRAMUSCULAR | Status: DC | PRN
  Administered 2021-03-18: 4 mg via INTRAVENOUS

## 2021-03-18 MED ORDER — FENTANYL CITRATE (PF) 100 MCG/2ML IJ SOLN
INTRAMUSCULAR | Status: DC | PRN
  Administered 2021-03-18: 50 ug via INTRAVENOUS
  Administered 2021-03-18: 25 ug via INTRAVENOUS

## 2021-03-18 MED ORDER — SODIUM CHLORIDE 0.9 % IR SOLN
Status: DC | PRN
  Administered 2021-03-18: 1000 mL

## 2021-03-18 MED ORDER — CHLORHEXIDINE GLUCONATE 0.12 % MT SOLN
OROMUCOSAL | Status: AC
  Administered 2021-03-18: 15 mL via OROMUCOSAL
  Filled 2021-03-18: qty 15

## 2021-03-18 MED ORDER — FENTANYL CITRATE (PF) 100 MCG/2ML IJ SOLN: 25.0000 ug | INTRAMUSCULAR | Status: DC | PRN

## 2021-03-18 MED ORDER — FAMOTIDINE 20 MG PO TABS
ORAL_TABLET | ORAL | Status: AC
  Administered 2021-03-18: 20 mg via ORAL
  Filled 2021-03-18: qty 1

## 2021-03-18 MED ORDER — PROPOFOL 10 MG/ML IV BOLUS
INTRAVENOUS | Status: AC
  Filled 2021-03-18: qty 20

## 2021-03-18 MED ORDER — LIDOCAINE HCL (CARDIAC) PF 100 MG/5ML IV SOSY
PREFILLED_SYRINGE | INTRAVENOUS | Status: DC | PRN
Start: 2021-03-18 — End: 2021-03-18
  Administered 2021-03-18: 100 mg via INTRAVENOUS

## 2021-03-18 MED ORDER — ONDANSETRON HCL 4 MG/2ML IJ SOLN: 4.0000 mg | Freq: Once | INTRAMUSCULAR | Status: DC | PRN

## 2021-03-18 MED ORDER — FENTANYL CITRATE (PF) 100 MCG/2ML IJ SOLN
INTRAMUSCULAR | Status: AC
  Filled 2021-03-18: qty 2

## 2021-03-18 MED ORDER — ONDANSETRON HCL 4 MG/2ML IJ SOLN
INTRAMUSCULAR | Status: AC
  Filled 2021-03-18: qty 2

## 2021-03-18 MED ORDER — SUGAMMADEX SODIUM 500 MG/5ML IV SOLN
INTRAVENOUS | Status: AC
  Filled 2021-03-18: qty 5

## 2021-03-18 MED ORDER — LIDOCAINE HCL (PF) 2 % IJ SOLN
INTRAMUSCULAR | Status: AC
  Filled 2021-03-18: qty 5

## 2021-03-18 MED ORDER — ROCURONIUM BROMIDE 10 MG/ML (PF) SYRINGE
PREFILLED_SYRINGE | INTRAVENOUS | Status: AC
  Filled 2021-03-18: qty 10

## 2021-03-18 MED ORDER — DEXAMETHASONE SODIUM PHOSPHATE 10 MG/ML IJ SOLN
INTRAMUSCULAR | Status: AC
  Filled 2021-03-18: qty 1

## 2021-03-18 MED ORDER — CEFAZOLIN SODIUM-DEXTROSE 2-4 GM/100ML-% IV SOLN
INTRAVENOUS | Status: AC
  Filled 2021-03-18: qty 100

## 2021-03-18 MED ORDER — PROPOFOL 10 MG/ML IV BOLUS
INTRAVENOUS | Status: DC | PRN
  Administered 2021-03-18: 150 mg via INTRAVENOUS
  Administered 2021-03-18: 100 mg via INTRAVENOUS

## 2021-03-18 SURGICAL SUPPLY — 15 items
BAG DRAIN CYSTO-URO LG1000N (MISCELLANEOUS) ×2 IMPLANT
GAUZE 4X4 16PLY ~~LOC~~+RFID DBL (SPONGE) ×4 IMPLANT
GLOVE SURG UNDER POLY LF SZ7.5 (GLOVE) ×2 IMPLANT
GOWN STRL REUS W/ TWL LRG LVL3 (GOWN DISPOSABLE) ×1 IMPLANT
GOWN STRL REUS W/ TWL XL LVL3 (GOWN DISPOSABLE) ×1 IMPLANT
GOWN STRL REUS W/TWL LRG LVL3 (GOWN DISPOSABLE) ×2
GOWN STRL REUS W/TWL XL LVL3 (GOWN DISPOSABLE) ×2
IV NS IRRIG 3000ML ARTHROMATIC (IV SOLUTION) ×2 IMPLANT
KIT TURNOVER CYSTO (KITS) ×2 IMPLANT
PACK CYSTO AR (MISCELLANEOUS) ×2 IMPLANT
SET CYSTO W/LG BORE CLAMP LF (SET/KITS/TRAYS/PACK) ×2 IMPLANT
SYR TOOMEY IRRIG 70ML (MISCELLANEOUS) ×2
SYRINGE TOOMEY IRRIG 70ML (MISCELLANEOUS) ×1 IMPLANT
WATER STERILE IRR 1000ML POUR (IV SOLUTION) ×2 IMPLANT
WATER STERILE IRR 500ML POUR (IV SOLUTION) ×2 IMPLANT

## 2021-03-18 NOTE — Anesthesia Preprocedure Evaluation (Addendum)
Anesthesia Evaluation  Patient identified by MRN, date of birth, ID band Patient awake    Reviewed: Allergy & Precautions, H&P , NPO status , Patient's Chart, lab work & pertinent test results, reviewed documented beta blocker date and time   History of Anesthesia Complications Negative for: history of anesthetic complications  Airway Mallampati: II  TM Distance: >3 FB Neck ROM: full    Dental no notable dental hx. (+) Edentulous Upper, Edentulous Lower   Pulmonary neg shortness of breath, neg sleep apnea, neg COPD, neg recent URI, former smoker,    Pulmonary exam normal breath sounds clear to auscultation- rhonchi (-) wheezing      Cardiovascular Exercise Tolerance: Good hypertension, (-) angina+ CAD, + Past MI (1995), + Cardiac Stents (2017), + CABG (in 1994), + Peripheral Vascular Disease and + DOE  Normal cardiovascular exam+ dysrhythmias (RBBB) (-) Valvular Problems/Murmurs Rhythm:regular Rate:Normal - Systolic murmurs and - Diastolic murmurs EKG 42/70: Sinus rhythm with 1st degree A-V block Right bundle branch block   Neuro/Psych neg Seizures negative neurological ROS  negative psych ROS   GI/Hepatic Neg liver ROS, GERD  Medicated and Controlled,  Endo/Other  negative endocrine ROS  Renal/GU Renal disease: nephrolithiasis.   Bladder Stone    Musculoskeletal  (+) Arthritis ,   Abdominal (+) - obese,   Peds  Hematology negative hematology ROS (+)   Anesthesia Other Findings Past Medical History: No date: Acquired trigger finger of both middle fingers No date: Aortic atherosclerosis (HCC) No date: Arthritis No date: BPH without obstruction/lower urinary tract symptoms No date: Cholelithiasis No date: Chronic constipation No date: Coronary artery disease     Comment:  a.) 3v CABG (LIMA-LAD, SVG-D1, SVG-dLCx) in 1995. b.)               LHC 01/05/2016: EF 55%; 100% pLAD, 100% oD1, 100% dLAD,               30%  p-mLCx, 65% dLCx-1, 100% dLCx-2, 95% distal graft               lesion. Atretic LIMA-LAD; patent SVG-D1; sig disease               SVG-PDA. 2.75 x 12 mm Resolute Integrity DES x 1 to               dSVG-PDA graft. No date: DOE (dyspnea on exertion) No date: Dysuria No date: GERD (gastroesophageal reflux disease) 05/28/2020: History of 2019 novel coronavirus disease (COVID-19) No date: Hypercholesteremia No date: Hyperlipidemia No date: Hypertension No date: Long term current use of antithrombotics/antiplatelets     Comment:  a.) Clopidogrel 1995: Myocardial infarction ([place]) No date: Nephrolithiasis No date: Nocturia No date: Prediabetes No date: Prostatic hypertrophy No date: PVD (peripheral vascular disease) (HCC) No date: RBBB (right bundle branch block) 1995: S/P CABG x 3     Comment:  a.) 3v CABG (LIMA-LAD, SVG-D1, SVG-dLCx) No date: Urinary frequency No date: Urinary incontinence   Reproductive/Obstetrics negative OB ROS                            Anesthesia Physical  Anesthesia Plan  ASA: 3  Anesthesia Plan: General   Post-op Pain Management:    Induction: Intravenous  PONV Risk Score and Plan: 1 and Ondansetron and Dexamethasone  Airway Management Planned: LMA  Additional Equipment:   Intra-op Plan:   Post-operative Plan:   Informed Consent: I have reviewed the patients History and  Physical, chart, labs and discussed the procedure including the risks, benefits and alternatives for the proposed anesthesia with the patient or authorized representative who has indicated his/her understanding and acceptance.     Dental advisory given  Plan Discussed with: CRNA and Anesthesiologist  Anesthesia Plan Comments:        Anesthesia Quick Evaluation

## 2021-03-18 NOTE — Anesthesia Postprocedure Evaluation (Signed)
Anesthesia Post Note  Patient: GREGGORY SAFRANEK  Procedure(s) Performed: CYSTOSCOPY WITH LITHOLAPAXY  Patient location during evaluation: PACU Anesthesia Type: General Level of consciousness: awake and alert and oriented Pain management: pain level controlled Vital Signs Assessment: post-procedure vital signs reviewed and stable Respiratory status: spontaneous breathing, nonlabored ventilation and respiratory function stable Cardiovascular status: blood pressure returned to baseline and stable Postop Assessment: no signs of nausea or vomiting Anesthetic complications: no   No notable events documented.   Last Vitals:  Vitals:   03/18/21 1353 03/18/21 1451  BP: (!) 161/77 136/75  Pulse: 79 76  Resp: 17 15  Temp: (!) 35.8 C   SpO2: 94% 98%    Last Pain:  Vitals:   03/18/21 1451  TempSrc:   PainSc: 0-No pain                 Khrystyna Schwalm

## 2021-03-18 NOTE — Op Note (Signed)
Date of procedure: 03/18/21  Preoperative diagnosis:  Bladder stone  Postoperative diagnosis:  Same  Procedure: Cystolitholapaxy, 1.5cm  Surgeon: Nickolas Madrid, MD  Anesthesia: General  Complications: None  Intraoperative findings:  Wide open channel after prior HOLEP, ureteral orifices orthotopic bilaterally, no suspicious bladder lesions Uncomplicated mechanical litholapaxy of 1.5 cm yellow stone, no bleeding  EBL: None  Specimens: None  Drains: None  Indication: Timothy Bass is a 87 y.o. patient with history of incomplete bladder emptying who underwent an uncomplicated HOLEP in May 2021 with relief of his urinary symptoms and low PVRs, he presented with some dysuria and frequency, and was found of a 1.5 cm bladder stone.  After reviewing the management options for treatment, they elected to proceed with the above surgical procedure(s). We have discussed the potential benefits and risks of the procedure, side effects of the proposed treatment, the likelihood of the patient achieving the goals of the procedure, and any potential problems that might occur during the procedure or recuperation. Informed consent has been obtained.  Description of procedure:  The patient was taken to the operating room and general anesthesia was induced. SCDs were placed for DVT prophylaxis. The patient was placed in the dorsal lithotomy position, prepped and draped in the usual sterile fashion, and preoperative antibiotics(Ancef) were administered. A preoperative time-out was performed.   A 21 French rigid cystoscope was used to intubate the urethra normal-appearing urethra was followed proximally in the bladder.  The prostatic urethra was wide open after prior HOLEP.  Ureteral orifices were orthotopic, and bladder mucosa grossly normal throughout.  There was a 1.5 cm yellow stone at the base of the bladder.  The mechanical stone crusher was used to carefully fragment the stone to small pieces, and these  were all irrigated free from the bladder.  Thorough cystoscopy revealed no residual stones and no bleeding.  The bladder was drained and this concluded our procedure.  Disposition: Stable to PACU  Plan: Follow-up in clinic in 6 months for PVR and symptom check  Nickolas Madrid, MD

## 2021-03-18 NOTE — Transfer of Care (Signed)
Immediate Anesthesia Transfer of Care Note  Patient: Timothy Bass  Procedure(s) Performed: CYSTOSCOPY WITH LITHOLAPAXY  Patient Location: PACU  Anesthesia Type:General  Level of Consciousness: awake  Airway & Oxygen Therapy: Patient Spontanous Breathing and Patient connected to face mask oxygen  Post-op Assessment: Report given to RN and Post -op Vital signs reviewed and stable  Post vital signs: stable  Last Vitals:  Vitals Value Taken Time  BP 156/82 03/18/21 1315  Temp 36.2 C 03/18/21 1315  Pulse 75 03/18/21 1320  Resp 16 03/18/21 1320  SpO2 96 % 03/18/21 1320  Vitals shown include unvalidated device data.  Last Pain:  Vitals:   03/18/21 1315  TempSrc:   PainSc: 0-No pain         Complications: No notable events documented.

## 2021-03-18 NOTE — Discharge Instructions (Signed)

## 2021-03-18 NOTE — H&P (Signed)
03/18/21 12:10 PM   Timothy Bass 01-27-1933 [id number]  CC: bladder stone  HPI: 88 year old male with history of incomplete bladder emptying and elevated PVRs greater than 250 mL who underwent an uncomplicated HOLEP in May 2021 with excellent results and normal PVRs with relief of his urinary symptoms.  His only complaint now is some occasional dysuria and urinary frequency.  Cystoscopy shows a 1.5 cm bladder stone.  We discussed options including observation, cystolitholapaxy, or cystolitholapaxy and resection of any residual tissue.  He has a nice open channel, and I recommended cystolitholapaxy alone.  Risk and benefits discussed at length.  PMH: Past Medical History:  Diagnosis Date   Acquired trigger finger of both middle fingers    Aortic atherosclerosis (HCC)    Arthritis    BPH without obstruction/lower urinary tract symptoms    Cholelithiasis    Chronic constipation    Coronary artery disease    a.) 3v CABG (LIMA-LAD, SVG-D1, SVG-dLCx) in 1995. b.) LHC 01/05/2016: EF 55%; 100% pLAD, 100% oD1, 100% dLAD, 30% p-mLCx, 65% dLCx-1, 100% dLCx-2, 95% distal graft lesion. Atretic LIMA-LAD; patent SVG-D1; sig disease SVG-PDA. 2.75 x 12 mm Resolute Integrity DES x 1 to dSVG-PDA graft.   DOE (dyspnea on exertion)    Dysuria    GERD (gastroesophageal reflux disease)    History of 2019 novel coronavirus disease (COVID-19) 05/28/2020   Hypercholesteremia    Hyperlipidemia    Hypertension    Long term current use of antithrombotics/antiplatelets    a.) Clopidogrel   Myocardial infarction ([place]) 1995   Nephrolithiasis    Nocturia    Prediabetes    Prostatic hypertrophy    PVD (peripheral vascular disease) (HCC)    RBBB (right bundle branch block)    S/P CABG x 3 1995   a.) 3v CABG (LIMA-LAD, SVG-D1, SVG-dLCx)   Urinary frequency    Urinary incontinence     Surgical History: Past Surgical History:  Procedure Laterality Date   CARDIAC CATHETERIZATION N/A 01/05/2016   Procedure:  LEFT HEART CATH AND CORS/GRAFTS ANGIOGRAPHY;  Surgeon: Corey Skains, MD;  Location: [place] [facility];  Service: Cardiovascular;  Laterality: N/A;   CARDIAC CATHETERIZATION N/A 01/05/2016   Procedure: Coronary Stent Intervention (2.75 x 12 mm Resolute Intregity DES x1 to dSVG-PDA graft);  Surgeon: Isaias Cowman, MD;  Location: [place] [facility];  Service: Cardiovascular;  Laterality: N/A;   COLONOSCOPY WITH PROPOFOL N/A 07/02/2017   Procedure: COLONOSCOPY WITH PROPOFOL;  Surgeon: Lollie Sails, MD;  Location: [facility] ENDOSCOPY;  Service: Endoscopy;  Laterality: N/A;   CORONARY ARTERY BYPASS GRAFT  1995   Procedure: 3v CABG (LIMA-LAD, SVG-D1, SVG-dLCx)   ESOPHAGOGASTRODUODENOSCOPY (EGD) WITH PROPOFOL N/A 05/27/2015   Procedure: ESOPHAGOGASTRODUODENOSCOPY (EGD) WITH PROPOFOL;  Surgeon: Hulen Luster, MD;  Location: [facility] ENDOSCOPY;  Service: Gastroenterology;  Laterality: N/A;   HOLEP-LASER ENUCLEATION OF THE PROSTATE WITH MORCELLATION N/A 10/17/2019   Procedure: HOLEP-LASER ENUCLEATION OF THE PROSTATE WITH MORCELLATION;  Surgeon: Billey Co, MD;  Location: [facility] ORS;  Service: Urology;  Laterality: N/A;   VASECTOMY       Family History: Family History  Family history unknown: Yes    Social History:  reports that he quit smoking about 27 years ago. His smoking use included cigarettes. He has a 30.00 pack-year smoking history. He has never used smokeless tobacco. He reports that he does not drink alcohol and does not use drugs.  Physical Exam: BP (!) 168/88   Pulse 93  Temp (!) 97.5 F (36.4 C) (Tympanic)   Resp 16   Ht 5\' 6"  (1.676 m)   Wt 75.3 kg   SpO2 96%   BMI 26.79 kg/m    Constitutional:  Alert and oriented, No acute distress. Cardiovascular: No clubbing, cyanosis, or edema. Respiratory: CTA bilaterally GI: Abdomen is soft, nontender, nondistended, no abdominal masses  Assessment & Plan:   87 year old male with history of incomplete bladder emptying and  elevated PVRs greater than 250 mL who underwent an uncomplicated HOLEP in May 2021 with excellent results and normal PVRs with relief of his urinary symptoms.  His only complaint now is some occasional dysuria and urinary frequency.  Cystoscopy shows a 1.5 cm bladder stone.  We discussed options including observation, cystolitholapaxy, or cystolitholapaxy and resection of any residual tissue.  He has a nice open channel, and I recommended cystolitholapaxy alone.  Risk and benefits discussed at length.  Cystolitholopaxy today  Nickolas Madrid, MD 03/18/2021  [facility] [address], [place], [place] [address] [phone]

## 2021-03-18 NOTE — Progress Notes (Signed)
Able to ambulate and void prior to discharge. D/C instructions discussed with son.

## 2021-03-19 ENCOUNTER — Encounter: Payer: Self-pay | Admitting: Urology

## 2021-05-18 ENCOUNTER — Encounter: Payer: Self-pay | Admitting: Urology

## 2021-08-31 ENCOUNTER — Ambulatory Visit (INDEPENDENT_AMBULATORY_CARE_PROVIDER_SITE_OTHER): Payer: Medicare Other | Admitting: Urology

## 2021-08-31 ENCOUNTER — Encounter: Payer: Self-pay | Admitting: Urology

## 2021-08-31 VITALS — BP 161/79 | HR 80 | Ht 66.0 in | Wt 165.0 lb

## 2021-08-31 DIAGNOSIS — N401 Enlarged prostate with lower urinary tract symptoms: Secondary | ICD-10-CM

## 2021-08-31 DIAGNOSIS — N138 Other obstructive and reflux uropathy: Secondary | ICD-10-CM

## 2021-08-31 DIAGNOSIS — N3281 Overactive bladder: Secondary | ICD-10-CM

## 2021-08-31 LAB — BLADDER SCAN AMB NON-IMAGING

## 2021-08-31 NOTE — Progress Notes (Signed)
? ?  08/31/2021 ?10:43 AM  ? ?EAMES DIBIASIO ?11-09-1932 ?[id number] ? ?Reason for visit: Follow up BPH status post HOLEP, OAB, dysuria, bladder stone ? ?HPI: ?88 year old male who underwent a uncomplicated HOLEP in May 2021 for a long history of obstructive symptoms, UTIs, and elevated PVRs greater than 250 mL.  He overall has done well.  He had some worsening dysuria and urinary frequency and he was actually found to have a 1.5 cm bladder stone in August 2022, and underwent cystolitholapaxy with me on 03/18/2021.  There was a wide open prostatic fossa at that time after HOLEP, and bladder was otherwise normal. ? ?Today, he reports resolution of the dysuria, and has minimal to no leakage.  His primary issue is ongoing nocturia every 2 hours, as well as some urinary frequency during the day.  PVR is normal at 80 mL.  He drinks primarily water during the day. ? ?He would like to try another OAB medication.  I recommended starting with Myrbetriq with his history of constipation, as well as his age and frailty and high risk for confusion/falls on anticholinergics.  We also discussed behavioral strategies including minimizing fluids 4 hours prior to bedtime. ? ?-Trial of Myrbetriq 50 mg daily ?-RTC 1 month PA ?-If Myrbetriq not covered/not affordable, could consider trospium 60 mg XL daily ?-With his age and long history of bladder outlet obstruction, may need to have realistic goals, and overall has had significant improvement after HOLEP with no further infections or elevation of PVRs ? ?Billey Co, MD ? ?[facility] ?[address], [address] ?[place], [place] [address] ?[phone] ? ? ?

## 2021-09-01 ENCOUNTER — Ambulatory Visit: Payer: Medicare HMO | Admitting: Urology

## 2021-09-29 ENCOUNTER — Ambulatory Visit: Payer: Medicare Other | Admitting: Physician Assistant

## 2021-09-30 ENCOUNTER — Encounter: Payer: Self-pay | Admitting: Physician Assistant

## 2021-10-07 ENCOUNTER — Telehealth: Payer: Self-pay | Admitting: Physician Assistant

## 2021-10-07 NOTE — Telephone Encounter (Signed)
Spoke with patient, denies any painful urination. Denies any other ongoing symptoms. Rescheduled follow up.

## 2021-10-07 NOTE — Telephone Encounter (Signed)
Timothy Bass needs to be triaged for painful urination, . FYI he missed his last appt and I tried to r/s his appt. Please call him at [phone].

## 2021-10-28 ENCOUNTER — Ambulatory Visit (INDEPENDENT_AMBULATORY_CARE_PROVIDER_SITE_OTHER): Payer: Medicare Other | Admitting: Physician Assistant

## 2021-10-28 ENCOUNTER — Encounter: Payer: Self-pay | Admitting: Physician Assistant

## 2021-10-28 ENCOUNTER — Ambulatory Visit: Payer: Medicare Other | Admitting: Physician Assistant

## 2021-10-28 VITALS — BP 126/73 | HR 88 | Ht 66.0 in | Wt 157.0 lb

## 2021-10-28 DIAGNOSIS — R350 Frequency of micturition: Secondary | ICD-10-CM

## 2021-10-28 LAB — BLADDER SCAN AMB NON-IMAGING

## 2021-10-28 MED ORDER — GEMTESA 75 MG PO TABS: 75.0000 mg | ORAL_TABLET | Freq: Every day | ORAL | 0 refills | Status: DC

## 2021-10-28 MED ORDER — CEFTRIAXONE SODIUM 1 G IJ SOLR: 1.0000 g | Freq: Once | INTRAMUSCULAR | Status: DC

## 2021-10-28 NOTE — Progress Notes (Signed)
10/28/2021 4:25 PM   Rockwell Germany [birth year] [id number]  CC: Chief Complaint  Patient presents with   Follow-up   Benign Prostatic Hypertrophy    4 week follow-up   HPI: Timothy Bass is a 88 y.o. male with PMH BPH with LUTS s/p HOLEP with Dr. Diamantina Providence in May 2021 and urinary frequency with nocturia who presents today for recheck on Myrbetriq 50 mg daily.  Visit facilitated by video interpreter.  Today he reports he took Myrbetriq for 1 week but developed hypertension with systolic pressure of 546 and subsequently stopped the medication.  He states he also had some headache and sweating on the medication.  PMH: Past Medical History:  Diagnosis Date   Acquired trigger finger of both middle fingers    Aortic atherosclerosis (HCC)    Arthritis    BPH without obstruction/lower urinary tract symptoms    Cholelithiasis    Chronic constipation    Coronary artery disease    a.) 3v CABG (LIMA-LAD, SVG-D1, SVG-dLCx) in 1995. b.) LHC 01/05/2016: EF 55%; 100% pLAD, 100% oD1, 100% dLAD, 30% p-mLCx, 65% dLCx-1, 100% dLCx-2, 95% distal graft lesion. Atretic LIMA-LAD; patent SVG-D1; sig disease SVG-PDA. 2.75 x 12 mm Resolute Integrity DES x 1 to dSVG-PDA graft.   DOE (dyspnea on exertion)    Dysuria    GERD (gastroesophageal reflux disease)    History of 2019 novel coronavirus disease (COVID-19) 05/28/2020   Hypercholesteremia    Hyperlipidemia    Hypertension    Long term current use of antithrombotics/antiplatelets    a.) Clopidogrel   Myocardial infarction ([place]) 1995   Nephrolithiasis    Nocturia    Prediabetes    Prostatic hypertrophy    PVD (peripheral vascular disease) (HCC)    RBBB (right bundle branch block)    S/P CABG x 3 1995   a.) 3v CABG (LIMA-LAD, SVG-D1, SVG-dLCx)   Urinary frequency    Urinary incontinence     Surgical History: Past Surgical History:  Procedure Laterality Date   CARDIAC CATHETERIZATION N/A 01/05/2016   Procedure: LEFT HEART CATH AND CORS/GRAFTS  ANGIOGRAPHY;  Surgeon: Corey Skains, MD;  Location: [place] [facility];  Service: Cardiovascular;  Laterality: N/A;   CARDIAC CATHETERIZATION N/A 01/05/2016   Procedure: Coronary Stent Intervention (2.75 x 12 mm Resolute Intregity DES x1 to dSVG-PDA graft);  Surgeon: Isaias Cowman, MD;  Location: [place] [facility];  Service: Cardiovascular;  Laterality: N/A;   COLONOSCOPY WITH PROPOFOL N/A 07/02/2017   Procedure: COLONOSCOPY WITH PROPOFOL;  Surgeon: Lollie Sails, MD;  Location[facility] ENDOSCOPY;  Service: Endoscopy;  Laterality: N/A;   CORONARY ARTERY BYPASS GRAFT  1995   Procedure: 3v CABG (LIMA-LAD, SVG-D1, SVG-dLCx)   CYSTOSCOPY WITH LITHOLAPAXY N/A 03/18/2021   Procedure: CYSTOSCOPY WITH LITHOLAPAXY;  Surgeon: Billey Co, MD;  Location: [facility] ORS;  Service: Urology;  Laterality: N/A;   ESOPHAGOGASTRODUODENOSCOPY (EGD) WITH PROPOFOL N/A 05/27/2015   Procedure: ESOPHAGOGASTRODUODENOSCOPY (EGD) WITH PROPOFOL;  Surgeon: Hulen Luster, MD;  Location: [facility] ENDOSCOPY;  Service: Gastroenterology;  Laterality: N/A;   HOLEP-LASER ENUCLEATION OF THE PROSTATE WITH MORCELLATION N/A 10/17/2019   Procedure: HOLEP-LASER ENUCLEATION OF THE PROSTATE WITH MORCELLATION;  Surgeon: Billey Co, MD;  Location: [facility] ORS;  Service: Urology;  Laterality: N/A;   VASECTOMY      Home Medications:  Allergies as of 10/28/2021       Reactions   Aspirin    Pt.states aspirin gives pt. high blood pressure Other reaction(s): Other (See  Comments), Unknown Pt.states aspirin gives pt. high blood pressure   Peanuts [peanut Oil] Itching        Medication List        Accurate as of October 28, 2021  4:25 PM. If you have any questions, ask your nurse or doctor.          amLODipine-benazepril 5-10 MG capsule Commonly known as: LOTREL Take 1 capsule by mouth every morning.   atorvastatin 80 MG tablet Commonly known as: Lipitor Take 1 tablet (80 mg total) by mouth daily.   bisacodyl 5 MG EC  tablet Commonly known as: DULCOLAX Take 5 mg by mouth daily as needed for moderate constipation.   clopidogrel 75 MG tablet Commonly known as: PLAVIX Take 1 tablet (75 mg total) by mouth daily with breakfast.   Hydrocortisone (Perianal) 1 % Crea Place 1 application rectally daily.   Linzess 145 MCG Caps capsule Generic drug: linaclotide Take 145 mcg by mouth daily as needed.   METAMUCIL FIBER PO Take by mouth daily.   OMEGA-3 FISH OIL PO Take by mouth daily.   ONE-A-DAY MENS 50+ PO Take by mouth daily.   polyethylene glycol 17 g packet Commonly known as: MIRALAX / GLYCOLAX Take 17 g by mouth daily as needed.   senna 8.6 MG Tabs tablet Commonly known as: SENOKOT Take 1 tablet by mouth daily as needed for mild constipation.        Allergies:  Allergies  Allergen Reactions   Aspirin     Pt.states aspirin gives pt. high blood pressure Other reaction(s): Other (See Comments), Unknown Pt.states aspirin gives pt. high blood pressure   Peanuts [Peanut Oil] Itching    Family History: Family History  Family history unknown: Yes    Social History:   reports that he quit smoking about 28 years ago. His smoking use included cigarettes. He has a 30.00 pack-year smoking history. He has been exposed to tobacco smoke. He has never used smokeless tobacco. He reports that he does not drink alcohol and does not use drugs.  Physical Exam: BP 126/73   Pulse 88   Ht '5\' 6"'$  (1.676 m)   Wt 157 lb (71.2 kg)   BMI 25.34 kg/m   Constitutional:  Alert and oriented, no acute distress, nontoxic appearing HEENT: [place], AT Cardiovascular: No clubbing, cyanosis, or edema Respiratory: Normal respiratory effort, no increased work of breathing Skin: No rashes, bruises or suspicious lesions Neurologic: Grossly intact, no focal deficits, moving all 4 extremities Psychiatric: Normal mood and affect  Laboratory Data: Results for orders placed or performed in visit on 10/28/21  BLADDER SCAN  AMB NON-IMAGING  Result Value Ref Range   Scan Result 3m    Assessment & Plan:   1. Urinary frequency Stopped Myrbetriq early due to side effects.  I offered him a trial of Gemtesa as an alternative.  We discussed that this may take up to 2 weeks before he notices a difference in his symptoms.  He expressed understanding. - BLADDER SCAN AMB NON-IMAGING - Vibegron (GEMTESA) 75 MG TABS; Take 75 mg by mouth daily.  Dispense: 28 tablet; Refill: 0   Return in about 4 weeks (around 11/25/2021) for Symptom recheck with PVR.  SDebroah Loop PA-C  [facility] [address] [place] [phone]

## 2021-11-25 ENCOUNTER — Ambulatory Visit (INDEPENDENT_AMBULATORY_CARE_PROVIDER_SITE_OTHER): Payer: Medicare Other | Admitting: Physician Assistant

## 2021-11-25 ENCOUNTER — Encounter: Payer: Self-pay | Admitting: Physician Assistant

## 2021-11-25 VITALS — BP 146/71 | HR 85 | Ht 66.0 in | Wt 157.0 lb

## 2021-11-25 DIAGNOSIS — L293 Anogenital pruritus, unspecified: Secondary | ICD-10-CM | POA: Diagnosis not present

## 2021-11-25 DIAGNOSIS — R350 Frequency of micturition: Secondary | ICD-10-CM | POA: Diagnosis not present

## 2021-11-25 LAB — BLADDER SCAN AMB NON-IMAGING

## 2021-11-25 MED ORDER — HYDROCORTISONE (PERIANAL) 1 % EX CREA: 1.0000 "application " | TOPICAL_CREAM | Freq: Every day | CUTANEOUS | 2 refills | Status: DC

## 2021-11-25 MED ORDER — GEMTESA 75 MG PO TABS: 75.0000 mg | ORAL_TABLET | Freq: Every day | ORAL | 11 refills | Status: DC

## 2021-11-25 NOTE — Progress Notes (Signed)
11/25/2021 4:31 PM   Rockwell Germany Mar 13, 1933 [id number]  CC: Chief Complaint  Patient presents with   Follow-up    4 week follow-up    Urinary Frequency   HPI: Timothy Bass is a 88 y.o. male with PMH BPH with LUTS s/p HOLEP with Dr. Diamantina Providence in May 2021 and urinary frequency with nocturia who presents today for symptom recheck on Gemtesa after previously discontinuing Myrbetriq due to side effects.  Visit facilitated by video interpreter.   Today he reports improved frequency and nocturia, now x2, on [place]  He wishes to continue it.  PVR 50 mL.  He also reports he has been using hydrocortisone cream on his perineum for some itching, which resolves this.  He asks for a refill today.  PMH: Past Medical History:  Diagnosis Date   Acquired trigger finger of both middle fingers    Aortic atherosclerosis (HCC)    Arthritis    BPH without obstruction/lower urinary tract symptoms    Cholelithiasis    Chronic constipation    Coronary artery disease    a.) 3v CABG (LIMA-LAD, SVG-D1, SVG-dLCx) in 1995. b.) LHC 01/05/2016: EF 55%; 100% pLAD, 100% oD1, 100% dLAD, 30% p-mLCx, 65% dLCx-1, 100% dLCx-2, 95% distal graft lesion. Atretic LIMA-LAD; patent SVG-D1; sig disease SVG-PDA. 2.75 x 12 mm Resolute Integrity DES x 1 to dSVG-PDA graft.   DOE (dyspnea on exertion)    Dysuria    GERD (gastroesophageal reflux disease)    History of 2019 novel coronavirus disease (COVID-19) 05/28/2020   Hypercholesteremia    Hyperlipidemia    Hypertension    Long term current use of antithrombotics/antiplatelets    a.) Clopidogrel   Myocardial infarction ([place]) 1995   Nephrolithiasis    Nocturia    Prediabetes    Prostatic hypertrophy    PVD (peripheral vascular disease) (HCC)    RBBB (right bundle branch block)    S/P CABG x 3 1995   a.) 3v CABG (LIMA-LAD, SVG-D1, SVG-dLCx)   Urinary frequency    Urinary incontinence     Surgical History: Past Surgical History:  Procedure Laterality Date    CARDIAC CATHETERIZATION N/A 01/05/2016   Procedure: LEFT HEART CATH AND CORS/GRAFTS ANGIOGRAPHY;  Surgeon: Corey Skains, MD;  Location: [place] CV LAB;  Service: Cardiovascular;  Laterality: N/A;   CARDIAC CATHETERIZATION N/A 01/05/2016   Procedure: Coronary Stent Intervention (2.75 x 12 mm Resolute Intregity DES x1 to dSVG-PDA graft);  Surgeon: Isaias Cowman, MD;  Location: [place] [facility];  Service: Cardiovascular;  Laterality: N/A;   COLONOSCOPY WITH PROPOFOL N/A 07/02/2017   Procedure: COLONOSCOPY WITH PROPOFOL;  Surgeon: Lollie Sails, MD;  Location: [facility] ENDOSCOPY;  Service: Endoscopy;  Laterality: N/A;   CORONARY ARTERY BYPASS GRAFT  1995   Procedure: 3v CABG (LIMA-LAD, SVG-D1, SVG-dLCx)   CYSTOSCOPY WITH LITHOLAPAXY N/A 03/18/2021   Procedure: CYSTOSCOPY WITH LITHOLAPAXY;  Surgeon: Billey Co, MD;  Location: [facility] ORS;  Service: Urology;  Laterality: N/A;   ESOPHAGOGASTRODUODENOSCOPY (EGD) WITH PROPOFOL N/A 05/27/2015   Procedure: ESOPHAGOGASTRODUODENOSCOPY (EGD) WITH PROPOFOL;  Surgeon: Hulen Luster, MD;  Location: [facility] ENDOSCOPY;  Service: Gastroenterology;  Laterality: N/A;   HOLEP-LASER ENUCLEATION OF THE PROSTATE WITH MORCELLATION N/A 10/17/2019   Procedure: HOLEP-LASER ENUCLEATION OF THE PROSTATE WITH MORCELLATION;  Surgeon: Billey Co, MD;  Location: [facility] ORS;  Service: Urology;  Laterality: N/A;   VASECTOMY      Home Medications:  Allergies as of 11/25/2021  Reactions   Aspirin    Pt.states aspirin gives pt. high blood pressure Other reaction(s): Other (See Comments), Unknown Pt.states aspirin gives pt. high blood pressure   Peanuts [peanut Oil] Itching        Medication List        Accurate as of November 25, 2021  4:31 PM. If you have any questions, ask your nurse or doctor.          amLODipine-benazepril 5-10 MG capsule Commonly known as: LOTREL Take 1 capsule by mouth every morning.   atorvastatin 80 MG tablet Commonly  known as: Lipitor Take 1 tablet (80 mg total) by mouth daily.   bisacodyl 5 MG EC tablet Commonly known as: DULCOLAX Take 5 mg by mouth daily as needed for moderate constipation.   clopidogrel 75 MG tablet Commonly known as: PLAVIX Take 1 tablet (75 mg total) by mouth daily with breakfast.   Gemtesa 75 MG Tabs Generic drug: Vibegron Take 75 mg by mouth daily.   Hydrocortisone (Perianal) 1 % Crea Place 1 application  rectally daily.   Linzess 145 MCG Caps capsule Generic drug: linaclotide Take 145 mcg by mouth daily as needed.   METAMUCIL FIBER PO Take by mouth daily.   OMEGA-3 FISH OIL PO Take by mouth daily.   ONE-A-DAY MENS 50+ PO Take by mouth daily.   polyethylene glycol 17 g packet Commonly known as: MIRALAX / GLYCOLAX Take 17 g by mouth daily as needed.   senna 8.6 MG Tabs tablet Commonly known as: SENOKOT Take 1 tablet by mouth daily as needed for mild constipation.        Allergies:  Allergies  Allergen Reactions   Aspirin     Pt.states aspirin gives pt. high blood pressure Other reaction(s): Other (See Comments), Unknown Pt.states aspirin gives pt. high blood pressure   Peanuts [Peanut Oil] Itching    Family History: Family History  Family history unknown: Yes    Social History:   reports that he quit smoking about 28 years ago. His smoking use included cigarettes. He has a 30.00 pack-year smoking history. He has been exposed to tobacco smoke. He has never used smokeless tobacco. He reports that he does not drink alcohol and does not use drugs.  Physical Exam: BP (!) 146/71   Pulse 85   Ht '5\' 6"'$  (1.676 m)   Wt 157 lb (71.2 kg)   BMI 25.34 kg/m   Constitutional:  Alert and oriented, no acute distress, nontoxic appearing HEENT: [place], AT Cardiovascular: No clubbing, cyanosis, or edema Respiratory: Normal respiratory effort, no increased work of breathing Skin: No rashes, bruises or suspicious lesions Neurologic: Grossly intact, no focal  deficits, moving all 4 extremities Psychiatric: Normal mood and affect  Laboratory Data: Results for orders placed or performed in visit on 11/25/21  BLADDER SCAN AMB NON-IMAGING  Result Value Ref Range   Scan Result 31m    Assessment & Plan:   1. Urinary frequency Emptying well, Gemtesa improving frequency and nocturia.  We will plan to continue this.  If Gemtesa stops helping, recommend pursuing PTNS in this patient. - BLADDER SCAN AMB NON-IMAGING - Vibegron (GEMTESA) 75 MG TABS; Take 75 mg by mouth daily.  Dispense: 30 tablet; Refill: 11  2. Pruritus genitalia Refilling hydrocortisone cream as requested - Hydrocortisone, Perianal, 1 % CREA; Place 1 application  rectally daily.  Dispense: 28 g; Refill: 2  Return in about 1 year (around 11/26/2022) for Annual OAB f/u with PVR.  SDebroah Loop PA-C  [facility] [address] [place], [place] [address] [phone]

## 2022-03-15 DIAGNOSIS — R7303 Prediabetes: Secondary | ICD-10-CM | POA: Insufficient documentation

## 2022-03-23 ENCOUNTER — Other Ambulatory Visit: Payer: Self-pay | Admitting: Family Medicine

## 2022-03-23 DIAGNOSIS — I719 Aortic aneurysm of unspecified site, without rupture: Secondary | ICD-10-CM

## 2022-05-26 ENCOUNTER — Ambulatory Visit (INDEPENDENT_AMBULATORY_CARE_PROVIDER_SITE_OTHER): Payer: 59 | Admitting: Physician Assistant

## 2022-05-26 VITALS — BP 177/80 | HR 71 | Ht 66.0 in | Wt 178.2 lb

## 2022-05-26 DIAGNOSIS — R3129 Other microscopic hematuria: Secondary | ICD-10-CM

## 2022-05-26 DIAGNOSIS — R350 Frequency of micturition: Secondary | ICD-10-CM | POA: Diagnosis not present

## 2022-05-26 LAB — URINALYSIS, COMPLETE
Bilirubin, UA: NEGATIVE
Glucose, UA: NEGATIVE
Ketones, UA: NEGATIVE
Leukocytes,UA: NEGATIVE
Nitrite, UA: NEGATIVE
Protein,UA: NEGATIVE
Specific Gravity, UA: 1.02 (ref 1.005–1.030)
Urobilinogen, Ur: 0.2 mg/dL (ref 0.2–1.0)
pH, UA: 7 (ref 5.0–7.5)

## 2022-05-26 LAB — MICROSCOPIC EXAMINATION

## 2022-05-26 LAB — BLADDER SCAN AMB NON-IMAGING: Scan Result: 188

## 2022-05-26 MED ORDER — TAMSULOSIN HCL 0.4 MG PO CAPS: 0.4000 mg | ORAL_CAPSULE | Freq: Every day | ORAL | 0 refills | Status: DC

## 2022-05-26 NOTE — Progress Notes (Signed)
05/26/2022 1:46 PM   Timothy Bass 02-03-1933 [id number]  CC: Chief Complaint  Patient presents with   Urinary Frequency   HPI: Timothy Bass is a 89 y.o. male with PMH BPH with LUTS s/p HOLEP with Dr. Diamantina Providence in May 2021 and urinary frequency with nocturia on Gemtesa who presents today for evaluation of urinary urgency and frequency.  Visit today facilitated by in person [place] interpreter.  Today he reports urinary frequency at least every hour after taking MiraLAX.  He wonders if the MiraLAX could be causing this.  He has not noticed bowel frequency on MiraLAX, and admits that he can go 3 days without a bowel movement if he does not take it.  He also reports bladder discomfort over the past month but denies flank pain or gross hematuria.  He has not been treated for UTI or taken any other antibiotics.  He ran out of [place] about 1 month ago and requests a refill today.  In-office UA today positive for 1+ blood; urine microscopy with 11-30 RBCs/HPF. PVR 144m, previously 50 mL.  PMH: Past Medical History:  Diagnosis Date   Acquired trigger finger of both middle fingers    Aortic atherosclerosis (HCC)    Arthritis    BPH without obstruction/lower urinary tract symptoms    Cholelithiasis    Chronic constipation    Coronary artery disease    a.) 3v CABG (LIMA-LAD, SVG-D1, SVG-dLCx) in 1995. b.) LHC 01/05/2016: EF 55%; 100% pLAD, 100% oD1, 100% dLAD, 30% p-mLCx, 65% dLCx-1, 100% dLCx-2, 95% distal graft lesion. Atretic LIMA-LAD; patent SVG-D1; sig disease SVG-PDA. 2.75 x 12 mm Resolute Integrity DES x 1 to dSVG-PDA graft.   DOE (dyspnea on exertion)    Dysuria    GERD (gastroesophageal reflux disease)    History of 2019 novel coronavirus disease (COVID-19) 05/28/2020   Hypercholesteremia    Hyperlipidemia    Hypertension    Long term current use of antithrombotics/antiplatelets    a.) Clopidogrel   Myocardial infarction ([facility] 1995   Nephrolithiasis    Nocturia    Prediabetes     Prostatic hypertrophy    PVD (peripheral vascular disease) (HCC)    RBBB (right bundle branch block)    S/P CABG x 3 1995   a.) 3v CABG (LIMA-LAD, SVG-D1, SVG-dLCx)   Urinary frequency    Urinary incontinence     Surgical History: Past Surgical History:  Procedure Laterality Date   CARDIAC CATHETERIZATION N/A 01/05/2016   Procedure: LEFT HEART CATH AND CORS/GRAFTS ANGIOGRAPHY;  Surgeon: BCorey Skains MD;  [facility];  Service: Cardiovascular;  Laterality: N/A;   CARDIAC CATHETERIZATION N/A 01/05/2016   Procedure: Coronary Stent Intervention (2.75 x 12 mm Resolute Intregity DES x1 to dSVG-PDA graft);  Surgeon: AIsaias Cowman MD;  [facility];  Service: Cardiovascular;  Laterality: N/A;   COLONOSCOPY WITH PROPOFOL N/A 07/02/2017   Procedure: COLONOSCOPY WITH PROPOFOL;  Surgeon: SLollie Sails MD;  [facility];  Service: Endoscopy;  Laterality: N/A;   CORONARY ARTERY BYPASS GRAFT  1995   Procedure: 3v CABG (LIMA-LAD, SVG-D1, SVG-dLCx)   CYSTOSCOPY WITH LITHOLAPAXY N/A 03/18/2021   Procedure: CYSTOSCOPY WITH LITHOLAPAXY;  Surgeon: SBilley Co MD;  Location: [facility];  Service: Urology;  Laterality: N/A;   ESOPHAGOGASTRODUODENOSCOPY (EGD) WITH PROPOFOL N/A 05/27/2015   Procedure: ESOPHAGOGASTRODUODENOSCOPY (EGD) WITH PROPOFOL;  Surgeon: PHulen Luster MD;  [facility];  Service: Gastroenterology;  Laterality: N/A;   HOLEP-LASER ENUCLEATION  OF THE PROSTATE WITH MORCELLATION N/A 10/17/2019   Procedure: HOLEP-LASER ENUCLEATION OF THE PROSTATE WITH MORCELLATION;  Surgeon: Billey Co, MD;  Location: [facility];  Service: Urology;  Laterality: N/A;   VASECTOMY      Home Medications:  Allergies as of 05/26/2022       Reactions   Aspirin    Pt.states aspirin gives pt. high blood pressure Other reaction(s): Other (See Comments), Unknown Pt.states aspirin gives pt. high blood pressure   Peanuts [peanut Oil]  Itching        Medication List        Accurate as of May 26, 2022  1:46 PM. If you have any questions, ask your nurse or doctor.          STOP taking these medications    amLODipine-benazepril 5-10 MG capsule Commonly known as: LOTREL   bisacodyl 5 MG EC tablet Commonly known as: DULCOLAX   Hydrocortisone (Perianal) 1 % Crea   Linzess 145 MCG Caps capsule Generic drug: linaclotide   METAMUCIL FIBER PO   OMEGA-3 FISH OIL PO   ONE-A-DAY MENS 50+ PO   senna 8.6 MG Tabs tablet Commonly known as: SENOKOT       TAKE these medications    atorvastatin 80 MG tablet Commonly known as: Lipitor Take 1 tablet (80 mg total) by mouth daily.   clopidogrel 75 MG tablet Commonly known as: PLAVIX Take 1 tablet (75 mg total) by mouth daily with breakfast.   Gemtesa 75 MG Tabs Generic drug: Vibegron Take 75 mg by mouth daily.   metoprolol succinate 50 MG 24 hr tablet Commonly known as: TOPROL-XL Take 1 tablet by mouth daily.   montelukast 10 MG tablet Commonly known as: SINGULAIR Take 1 tablet by mouth at bedtime.   polyethylene glycol 17 g packet Commonly known as: MIRALAX / GLYCOLAX Take 17 g by mouth daily as needed.        Allergies:  Allergies  Allergen Reactions   Aspirin     Pt.states aspirin gives pt. high blood pressure Other reaction(s): Other (See Comments), Unknown Pt.states aspirin gives pt. high blood pressure   Peanuts [Peanut Oil] Itching    Family History: Family History  Family history unknown: Yes    Social History:   reports that he quit smoking about 29 years ago. His smoking use included cigarettes. He has a 30.00 pack-year smoking history. He has been exposed to tobacco smoke. He has never used smokeless tobacco. He reports that he does not drink alcohol and does not use drugs.  Physical Exam: BP (!) 177/80   Pulse 71   Ht '5\' 6"'$  (1.676 m)   Wt 178 lb 4 oz (80.9 kg)   BMI 28.77 kg/m   Constitutional:  Alert and  oriented, no acute distress, nontoxic appearing HEENT: [place], AT Cardiovascular: No clubbing, cyanosis, or edema Respiratory: Normal respiratory effort, no increased work of breathing Skin: No rashes, bruises or suspicious lesions Neurologic: Grossly intact, no focal deficits, moving all 4 extremities Psychiatric: Normal mood and affect  Laboratory Data: Results for orders placed or performed in visit on 05/26/22  Microscopic Examination   Urine  Result Value Ref Range   WBC, UA 0-5 0 - 5 /hpf   RBC, Urine 11-30 (A) 0 - 2 /hpf   Epithelial Cells (non renal) 0-10 0 - 10 /hpf   Bacteria, UA Few None seen/Few  Urinalysis, Complete  Result Value Ref Range   Specific Gravity, UA 1.020 1.005 - 1.030  pH, UA 7.0 5.0 - 7.5   Color, UA Yellow Yellow   Appearance Ur Clear Clear   Leukocytes,UA Negative Negative   Protein,UA Negative Negative/Trace   Glucose, UA Negative Negative   Ketones, UA Negative Negative   RBC, UA 1+ (A) Negative   Bilirubin, UA Negative Negative   Urobilinogen, Ur 0.2 0.2 - 1.0 mg/dL   Nitrite, UA Negative Negative   Microscopic Examination See below:   Bladder Scan (Post Void Residual) in office  Result Value Ref Range   Scan Result 188 ml    Assessment & Plan:   1. Frequent urination Urinary frequency over the past month with a return of incomplete bladder emptying, which initially resolved following HOLEP.  He has been off Gemtesa and I would like him to stay off this pending workup as follows.  Will start him on Flomax to increase bladder emptying and recommend further evaluation as below. - Urinalysis, Complete - Bladder Scan (Post Void Residual) in office - tamsulosin (FLOMAX) 0.4 MG CAPS capsule; Take 1 capsule (0.4 mg total) by mouth daily.  Dispense: 30 capsule; Refill: 0  2. Microscopic hematuria New today.  No flank pain, but he does admit to some bladder discomfort.  Will send for standard and atypical cultures, though low suspicion for infection  today in the absence of pyuria.  We discussed that I recommend pursuing hematuria workup at this time with a CT urogram and follow-up cystoscopy with Dr. Diamantina Providence.  He is in agreement with this plan. - CT HEMATURIA WORKUP; Future  - CULTURE, URINE COMPREHENSIVE - Mycoplasma / ureaplasma culture  Return in about 3 weeks (around 06/16/2022) for Cysto and CTU results with Dr. Diamantina Providence, will call with culture results.  Debroah Loop, PA-C  [facility] [address] [place], [place] [address] [phone]

## 2022-05-30 LAB — CULTURE, URINE COMPREHENSIVE

## 2022-06-02 LAB — MYCOPLASMA / UREAPLASMA CULTURE
Mycoplasma hominis Culture: NEGATIVE
Ureaplasma urealyticum: NEGATIVE

## 2022-06-17 ENCOUNTER — Other Ambulatory Visit: Payer: Self-pay | Admitting: Physician Assistant

## 2022-06-17 DIAGNOSIS — R350 Frequency of micturition: Secondary | ICD-10-CM

## 2022-06-19 NOTE — Telephone Encounter (Signed)
Request from pharmacy to refill for 90 day supply

## 2022-06-23 ENCOUNTER — Ambulatory Visit
Admission: RE | Admit: 2022-06-23 | Discharge: 2022-06-23 | Disposition: A | Discharge status: Home / Self Care | Payer: 59 | Source: Ambulatory Visit | Attending: Physician Assistant | Admitting: Physician Assistant

## 2022-06-23 DIAGNOSIS — R3129 Other microscopic hematuria: Secondary | ICD-10-CM | POA: Diagnosis present

## 2022-06-23 MED ORDER — IOHEXOL 350 MG/ML SOLN
100.0000 mL | Freq: Once | INTRAVENOUS | Status: AC | PRN
  Administered 2022-06-23: 100 mL via INTRAVENOUS

## 2022-06-28 ENCOUNTER — Other Ambulatory Visit: Payer: 59 | Admitting: Urology

## 2022-06-29 ENCOUNTER — Encounter: Payer: Self-pay | Admitting: Urology

## 2022-06-29 ENCOUNTER — Ambulatory Visit (INDEPENDENT_AMBULATORY_CARE_PROVIDER_SITE_OTHER): Payer: 59 | Admitting: Urology

## 2022-06-29 VITALS — BP 172/76 | HR 70 | Ht 66.0 in | Wt 178.0 lb

## 2022-06-29 DIAGNOSIS — R3129 Other microscopic hematuria: Secondary | ICD-10-CM

## 2022-06-29 MED ORDER — CEPHALEXIN 250 MG PO CAPS
500.0000 mg | ORAL_CAPSULE | Freq: Once | ORAL | Status: AC
  Administered 2022-06-29: 500 mg via ORAL

## 2022-06-29 MED ORDER — GEMTESA 75 MG PO TABS: 75.0000 mg | ORAL_TABLET | Freq: Every day | ORAL | 11 refills | Status: DC

## 2022-06-29 NOTE — Progress Notes (Signed)
Cystoscopy Procedure Note:  Indication: Microscopic hematuria  89 year old male who underwent HOLEP in May 2021 for a long history of obstructive symptoms, UTIs, and elevated PVRs >221m.  He had been doing well before developing some dysuria and urinary frequency and was actually found to have a 1.5 cm bladder stone in August 2022 and underwent a cystolitholapaxy in October 2022.  Prostatic fossa was wide open at that time.  After removal of bladder stone, he had had resolution of his dysuria, but had some residual bothersome urgency, frequency, and nocturia.  He did not do well on Myrbetriq secondary to hypertension, but sounds like he was doing very well on [place]  He was recently found to have microscopic hematuria as well as a mildly elevated PVR of 1616m and SaStarwood HotelsPA ordered a CT urogram which was benign and she discontinued the Ge[place] Ranchnd started him on Flomax.  He denies any improvement in the urination on the Flomax, and has had return of frequency and urgency since stopping the [place] Having nocturia 1-2 times at night currently.  After informed consent and discussion of the procedure and its risks, Timothy CHANDLEYas positioned and prepped in the standard fashion. Cystoscopy was performed with a flexible cystoscope. The urethra, bladder neck and entire bladder was visualized in a standard fashion.  Open prostatic fossa from prior HOLEP, minimal anterior regrowth of adenoma.  The ureteral orifices were visualized in their normal location and orientation.  Bladder mucosa grossly normal throughout  Imaging: CT urogram with no abnormalities  Findings: Normal cystoscopy, open prostatic fossa with minimal regrowth, normal bladder  Assessment and Plan: Recommended going back to the [place] seem to be helping him previously, stop Flomax.  Return precautions discussed RTC with me 2 to 3 months PVR  BrNickolas MadridMD 06/29/2022

## 2022-07-05 LAB — POCT I-STAT CREATININE: Creatinine, Ser: 0.8 mg/dL (ref 0.61–1.24)

## 2022-08-24 ENCOUNTER — Ambulatory Visit (INDEPENDENT_AMBULATORY_CARE_PROVIDER_SITE_OTHER): Payer: 59 | Admitting: Urology

## 2022-08-24 ENCOUNTER — Encounter: Payer: Self-pay | Admitting: Urology

## 2022-08-24 VITALS — BP 175/75 | HR 76 | Ht 64.0 in | Wt 180.0 lb

## 2022-08-24 DIAGNOSIS — R350 Frequency of micturition: Secondary | ICD-10-CM

## 2022-08-24 DIAGNOSIS — R399 Unspecified symptoms and signs involving the genitourinary system: Secondary | ICD-10-CM | POA: Diagnosis not present

## 2022-08-24 LAB — BLADDER SCAN AMB NON-IMAGING: Scan Result: 126

## 2022-08-24 MED ORDER — GEMTESA 75 MG PO TABS: 75.0000 mg | ORAL_TABLET | Freq: Every day | ORAL | 11 refills | Status: DC

## 2022-08-24 NOTE — Progress Notes (Signed)
   08/24/2022 1:54 PM   Timothy Bass 08/22/1932 [id number]  Reason for visit: Follow up BPH, OAB, urinary symptoms, history of microscopic hematuria  HPI: 89 year old male who underwent HOLEP in May 2021 for a long history of obstructive symptoms, UTIs, and elevated PVRs >268ml.  He had been doing well before developing some dysuria and urinary frequency and was actually found to have a 1.5 cm bladder stone in August 2022 and underwent a cystolitholapaxy in October 2022.  Prostatic fossa was wide open at that time.  After removal of bladder stone, he had had resolution of his dysuria, but had some residual bothersome urgency, frequency, and nocturia.  He did not do well on Myrbetriq secondary to hypertension, but was doing very well on [place]  He was seen by one of the PAs in January 2024 and had a mildly elevated PVR of 160 and microscopic hematuria and she ordered a CT urogram and scheduled cystoscopy.  The CT was benign.  She also stopped the [place] and started Flomax.  He had worsening of urinary symptoms when stopping the [place]  He had a clinic cystoscopy with me on 06/29/2022 which was completely benign, and I resumed his Gemtesa at that time and recommended close follow-up for repeat PVR.  PVR today is normal at 173ml, and he denies any urinary symptoms at all on the [place].  Continue Gemtesa RTC with me 1 year PVR   Billey Co, MD  [facility] [address] [place], [place] [address] [phone]

## 2022-09-25 DIAGNOSIS — Z955 Presence of coronary angioplasty implant and graft: Secondary | ICD-10-CM | POA: Insufficient documentation

## 2022-10-23 ENCOUNTER — Encounter: Payer: Self-pay | Admitting: Ophthalmology

## 2022-10-23 NOTE — Anesthesia Preprocedure Evaluation (Addendum)
Anesthesia Evaluation  Patient identified by MRN, date of birth, ID band Patient awake    Reviewed: Allergy & Precautions, H&P , NPO status , Patient's Chart, lab work & pertinent test results  Airway Mallampati: III  TM Distance: >3 FB Neck ROM: Full    Dental no notable dental hx. (+) Upper Dentures, Lower Dentures   Pulmonary former smoker   Pulmonary exam normal breath sounds clear to auscultation       Cardiovascular hypertension, + angina  + CAD, + Past MI, + Peripheral Vascular Disease and + DOE  Normal cardiovascular exam+ dysrhythmias  Rhythm:Regular Rate:Normal  Note: preop hypertension  ist Graft to Insertion lesion (Saphenous Graft To Dist Cx) Angioplasty Lesion crossed with guidewire. Pre-stent angioplasty was performed using a BALLOON TREK RX 2.5X12. A drug eluting stent was successfully placed, and does not overlap previously placed stent. Stent strut is well apposed. Post-stent angioplasty was not performed. The pre-interventional distal flow is decreased (TIMI 2). The post-interventional distal flow is normal (TIMI 3). The intervention was successful . No complications occurred at this lesion. Pressure wire/FFR was not performed on the lesion . IVUS was not performed on the lesion . There is a 0% residual stenosis post intervention. 01-05-16    Neuro/Psych negative neurological ROS  negative psych ROS   GI/Hepatic negative GI ROS, Neg liver ROS,GERD  ,,  Endo/Other  negative endocrine ROS    Renal/GU Renal disease  negative genitourinary   Musculoskeletal  (+) Arthritis ,    Abdominal   Peds negative pediatric ROS (+)  Hematology negative hematology ROS (+)   Anesthesia Other Findings Hyperlipidemia  BPH without obstruction/lower urinary tract symptoms Hypertension  Dysuria Nocturia  Urinary incontinence Urinary frequency  Coronary artery disease Prediabetes Prostatic  hypertrophy Hypercholesteremia GERD (gastroesophageal reflux disease) Myocardial infarction (HCC)  Arthritis Chronic constipation  Acquired trigger finger of both middle fingers DOE (dyspnea on exertion)  History of 2019 (COVID-19) Long term current use of antithrombotics/antiplatelets  S/P CABG x 3 Nephrolithiasis  PVD (peripheral vascular disease) (HCC) Aortic atherosclerosis (HCC)  RBBB (right bundle branch block) Cholelithiasis     Reproductive/Obstetrics negative OB ROS                             Anesthesia Physical Anesthesia Plan  ASA: 3  Anesthesia Plan: MAC   Post-op Pain Management:    Induction: Intravenous  PONV Risk Score and Plan:   Airway Management Planned: Natural Airway and Nasal Cannula  Additional Equipment:   Intra-op Plan:   Post-operative Plan:   Informed Consent: I have reviewed the patients History and Physical, chart, labs and discussed the procedure including the risks, benefits and alternatives for the proposed anesthesia with the patient or authorized representative who has indicated his/her understanding and acceptance.     Dental Advisory Given  Plan Discussed with: Anesthesiologist, CRNA and Surgeon  Anesthesia Plan Comments: (Patient consented for risks of anesthesia including but not limited to:  - adverse reactions to medications - damage to eyes, teeth, lips or other oral mucosa - nerve damage due to positioning  - sore throat or hoarseness - Damage to heart, brain, nerves, lungs, other parts of body or loss of life  Patient voiced understanding.)        Anesthesia Quick Evaluation

## 2022-10-26 NOTE — Discharge Instructions (Signed)

## 2022-10-30 ENCOUNTER — Ambulatory Visit: Payer: 59 | Admitting: Anesthesiology

## 2022-10-30 ENCOUNTER — Encounter: Payer: Self-pay | Admitting: Ophthalmology

## 2022-10-30 ENCOUNTER — Other Ambulatory Visit: Payer: Self-pay

## 2022-10-30 ENCOUNTER — Ambulatory Visit
Admission: RE | Admit: 2022-10-30 | Discharge: 2022-10-30 | Disposition: A | Discharge status: Home / Self Care | Payer: 59 | Attending: Ophthalmology | Admitting: Ophthalmology

## 2022-10-30 ENCOUNTER — Encounter
Admission: RE | Disposition: A | Discharge status: Home / Self Care | Payer: Self-pay | Source: Home / Self Care | Attending: Ophthalmology

## 2022-10-30 DIAGNOSIS — E7800 Pure hypercholesterolemia, unspecified: Secondary | ICD-10-CM | POA: Diagnosis not present

## 2022-10-30 DIAGNOSIS — K219 Gastro-esophageal reflux disease without esophagitis: Secondary | ICD-10-CM | POA: Insufficient documentation

## 2022-10-30 DIAGNOSIS — I4510 Unspecified right bundle-branch block: Secondary | ICD-10-CM | POA: Diagnosis not present

## 2022-10-30 DIAGNOSIS — Z7902 Long term (current) use of antithrombotics/antiplatelets: Secondary | ICD-10-CM | POA: Insufficient documentation

## 2022-10-30 DIAGNOSIS — Z87891 Personal history of nicotine dependence: Secondary | ICD-10-CM | POA: Insufficient documentation

## 2022-10-30 DIAGNOSIS — I252 Old myocardial infarction: Secondary | ICD-10-CM | POA: Insufficient documentation

## 2022-10-30 DIAGNOSIS — I739 Peripheral vascular disease, unspecified: Secondary | ICD-10-CM | POA: Insufficient documentation

## 2022-10-30 DIAGNOSIS — I700 Atherosclerosis of aorta: Secondary | ICD-10-CM | POA: Insufficient documentation

## 2022-10-30 DIAGNOSIS — Z79899 Other long term (current) drug therapy: Secondary | ICD-10-CM | POA: Insufficient documentation

## 2022-10-30 DIAGNOSIS — I2510 Atherosclerotic heart disease of native coronary artery without angina pectoris: Secondary | ICD-10-CM | POA: Diagnosis not present

## 2022-10-30 DIAGNOSIS — M1990 Unspecified osteoarthritis, unspecified site: Secondary | ICD-10-CM | POA: Insufficient documentation

## 2022-10-30 DIAGNOSIS — I10 Essential (primary) hypertension: Secondary | ICD-10-CM | POA: Diagnosis not present

## 2022-10-30 DIAGNOSIS — Z951 Presence of aortocoronary bypass graft: Secondary | ICD-10-CM | POA: Insufficient documentation

## 2022-10-30 DIAGNOSIS — H2511 Age-related nuclear cataract, right eye: Secondary | ICD-10-CM | POA: Insufficient documentation

## 2022-10-30 DIAGNOSIS — Z955 Presence of coronary angioplasty implant and graft: Secondary | ICD-10-CM | POA: Diagnosis not present

## 2022-10-30 HISTORY — PX: CATARACT EXTRACTION W/PHACO: SHX586

## 2022-10-30 SURGERY — PHACOEMULSIFICATION, CATARACT, WITH IOL INSERTION
Anesthesia: Monitor Anesthesia Care | Site: Eye | Laterality: Right

## 2022-10-30 MED ORDER — SIGHTPATH DOSE#1 BSS IO SOLN
INTRAOCULAR | Status: DC | PRN
  Administered 2022-10-30: 15 mL

## 2022-10-30 MED ORDER — SODIUM CHLORIDE 0.9% FLUSH
INTRAVENOUS | Status: DC | PRN
  Administered 2022-10-30: 10 mL via INTRAVENOUS

## 2022-10-30 MED ORDER — ARMC OPHTHALMIC DILATING DROPS
1.0000 | OPHTHALMIC | Status: DC | PRN
  Administered 2022-10-30 (×3): 1 via OPHTHALMIC

## 2022-10-30 MED ORDER — MOXIFLOXACIN HCL 0.5 % OP SOLN
OPHTHALMIC | Status: DC | PRN
  Administered 2022-10-30: .2 mL via OPHTHALMIC

## 2022-10-30 MED ORDER — FENTANYL CITRATE (PF) 100 MCG/2ML IJ SOLN
INTRAMUSCULAR | Status: DC | PRN
  Administered 2022-10-30: 25 ug via INTRAVENOUS

## 2022-10-30 MED ORDER — BRIMONIDINE TARTRATE-TIMOLOL 0.2-0.5 % OP SOLN
OPHTHALMIC | Status: DC | PRN
  Administered 2022-10-30: 1 [drp] via OPHTHALMIC

## 2022-10-30 MED ORDER — LACTATED RINGERS IV SOLN: INTRAVENOUS | Status: DC

## 2022-10-30 MED ORDER — SIGHTPATH DOSE#1 NA HYALUR & NA CHOND-NA HYALUR IO KIT
PACK | INTRAOCULAR | Status: DC | PRN
  Administered 2022-10-30: 1 via OPHTHALMIC

## 2022-10-30 MED ORDER — LIDOCAINE HCL (PF) 2 % IJ SOLN
INTRAOCULAR | Status: DC | PRN
  Administered 2022-10-30: 1 mL via INTRAOCULAR

## 2022-10-30 MED ORDER — TETRACAINE HCL 0.5 % OP SOLN
1.0000 [drp] | OPHTHALMIC | Status: DC | PRN
  Administered 2022-10-30 (×3): 1 [drp] via OPHTHALMIC

## 2022-10-30 MED ORDER — MIDAZOLAM HCL 2 MG/2ML IJ SOLN
INTRAMUSCULAR | Status: DC | PRN
  Administered 2022-10-30: 1 mg via INTRAVENOUS

## 2022-10-30 MED ORDER — PHENYLEPHRINE-KETOROLAC 1-0.3 % IO SOLN
INTRAOCULAR | Status: DC | PRN
  Administered 2022-10-30: 79 mL via OPHTHALMIC

## 2022-10-30 SURGICAL SUPPLY — 9 items
CATARACT SUITE SIGHTPATH (MISCELLANEOUS) ×1 IMPLANT
DISSECTOR HYDRO NUCLEUS 50X22 (MISCELLANEOUS) ×1 IMPLANT
DRSG TEGADERM 2-3/8X2-3/4 SM (GAUZE/BANDAGES/DRESSINGS) ×1 IMPLANT
FEE CATARACT SUITE SIGHTPATH (MISCELLANEOUS) ×1 IMPLANT
GLOVE SURG SYN 7.5 E (GLOVE) ×1 IMPLANT
GLOVE SURG SYN 7.5 PF PI (GLOVE) ×1 IMPLANT
GLOVE SURG SYN 8.5 E (GLOVE) ×1 IMPLANT
GLOVE SURG SYN 8.5 PF PI (GLOVE) ×1 IMPLANT
LENS IOL TECNIS EYHANCE 19.5 (Intraocular Lens) IMPLANT

## 2022-10-30 NOTE — Op Note (Signed)
OPERATIVE NOTE  DETROIT FRIEDEN [id number] 10/30/2022   PREOPERATIVE DIAGNOSIS: Nuclear sclerotic cataract right eye. H25.11   POSTOPERATIVE DIAGNOSIS: Nuclear sclerotic cataract right eye. H25.11   PROCEDURE:  Phacoemusification with posterior chamber intraocular lens placement of the right eye  Ultrasound time: Procedure(s): CATARACT EXTRACTION PHACO AND INTRAOCULAR LENS PLACEMENT (IOC) RIGHT  10.55  01:05.6 (Right)  LENS:   Implant Name Type Inv. Item Serial No. Manufacturer Lot No. LRB No. Used Action  LENS IOL TECNIS EYHANCE 19.5 - [id number] Intraocular Lens LENS IOL TECNIS EYHANCE 19.5 [id number] [employer]  Right 1 Implanted      SURGEON:  Julious Payer. Rolley Sims, MD   ANESTHESIA:  Topical with tetracaine drops, augmented with 1% preservative-free intracameral lidocaine.   COMPLICATIONS:  None.   DESCRIPTION OF PROCEDURE:  The patient was identified in the holding room and transported to the operating room and placed in the supine position under the operating microscope.  The right eye was identified as the operative eye, which was prepped and draped in the usual sterile ophthalmic fashion.   A 1 millimeter clear-corneal paracentesis was made superotemporally. Preservative-free 1% lidocaine mixed with 1:1,000 bisulfite-free aqueous solution of epinephrine was injected into the anterior chamber. There was no red reflex so Trypan blue was injected intracamerally to stain and the anterior capsule to facilitate safe creation of the capsulorrhexis. The anterior chamber was then filled with Viscoat viscoelastic. A 2.4 millimeter keratome was used to make a clear-corneal incision inferotemporally. A curvilinear capsulorrhexis was made with a cystotome and capsulorrhexis forceps. Balanced salt solution was used to hydrodissect and hydrodelineate the nucleus. Phacoemulsification was then used to remove the lens nucleus and epinucleus. The remaining cortex was then removed using the irrigation and  aspiration handpiece. Provisc was then placed into the capsular bag to distend it for lens placement. A +19.50 D [id number] intraocular lens was then injected into the capsular bag. The remaining viscoelastic was aspirated.   Wounds were hydrated with balanced salt solution.  The anterior chamber was inflated to a physiologic pressure with balanced salt solution.  No wound leaks were noted. Vigamox was injected intracamerally.  Timolol and Brimonidine drops were applied to the eye.  The patient was taken to the recovery room in stable condition without complications of anesthesia or surgery.  Rolly Pancake [place] 10/30/2022, 10:49 AM

## 2022-10-30 NOTE — Transfer of Care (Signed)
Immediate Anesthesia Transfer of Care Note  Patient: ROLLAND STEINERT  Procedure(s) Performed: CATARACT EXTRACTION PHACO AND INTRAOCULAR LENS PLACEMENT (IOC) RIGHT  10.55  01:05.6 (Right: Eye)  Patient Location: PACU  Anesthesia Type:MAC  Level of Consciousness: awake, alert , and oriented  Airway & Oxygen Therapy: Patient Spontanous Breathing  Post-op Assessment: Report given to RN and Post -op Vital signs reviewed and stable  Post vital signs: Reviewed and stable  Last Vitals:  Vitals Value Taken Time  BP 182/77 10/30/22 1052  Temp 97 10/30/22  1054  Pulse 57 10/30/22 1053  Resp 21 10/30/22 1053  SpO2 96 % 10/30/22 1053  Vitals shown include unvalidated device data.  Last Pain:  Vitals:   10/30/22 0859  TempSrc: Temporal  PainSc: 0-No pain         Complications: No notable events documented.

## 2022-10-30 NOTE — H&P (Signed)
[facility] [place]   Primary Care Physician:  Timothy Baseman, MD Ophthalmologist: Dr. Deberah Bass  Pre-Procedure History & Physical: HPI:  Timothy Bass is a 89 y.o. male here for cataract surgery.   Past Medical History:  Diagnosis Date   Acquired trigger finger of both middle fingers    Aortic atherosclerosis (HCC)    Arthritis    BPH without obstruction/lower urinary tract symptoms    Cholelithiasis    Chronic constipation    Coronary artery disease    a.) 3v CABG (LIMA-LAD, SVG-D1, SVG-dLCx) in 1995. b.) LHC 01/05/2016: EF 55%; 100% pLAD, 100% oD1, 100% dLAD, 30% p-mLCx, 65% dLCx-1, 100% dLCx-2, 95% distal graft lesion. Atretic LIMA-LAD; patent SVG-D1; sig disease SVG-PDA. 2.75 x 12 mm Resolute Integrity DES x 1 to dSVG-PDA graft.   DOE (dyspnea on exertion)    Dysuria    GERD (gastroesophageal reflux disease)    History of 2019 novel coronavirus disease (COVID-19) 05/28/2020   Hypercholesteremia    Hyperlipidemia    Hypertension    Long term current use of antithrombotics/antiplatelets    a.) Clopidogrel   Myocardial infarction (HCC) 1995   Nephrolithiasis    Nocturia    Prediabetes    Prostatic hypertrophy    PVD (peripheral vascular disease) (HCC)    RBBB (right bundle branch block)    S/P CABG x 3 1995   a.) 3v CABG (LIMA-LAD, SVG-D1, SVG-dLCx)   Urinary frequency    Urinary incontinence     Past Surgical History:  Procedure Laterality Date   CARDIAC CATHETERIZATION N/A 01/05/2016   Procedure: LEFT HEART CATH AND CORS/GRAFTS ANGIOGRAPHY;  Surgeon: Timothy Blinks, MD;  Location: [facility] INVASIVE CV LAB;  Service: Cardiovascular;  Laterality: N/A;   CARDIAC CATHETERIZATION N/A 01/05/2016   Procedure: Coronary Stent Intervention (2.75 x 12 mm Resolute Intregity DES x1 to dSVG-PDA graft);  Surgeon: Timothy Millard, MD;  Location: [facility] INVASIVE CV LAB;  Service: Cardiovascular;  Laterality: N/A;   COLONOSCOPY WITH PROPOFOL N/A 07/02/2017   Procedure: COLONOSCOPY  WITH PROPOFOL;  Surgeon: Timothy Deem, MD;  Location: [facility] ENDOSCOPY;  Service: Endoscopy;  Laterality: N/A;   CORONARY ARTERY BYPASS GRAFT  1995   Procedure: 3v CABG (LIMA-LAD, SVG-D1, SVG-dLCx)   CYSTOSCOPY WITH LITHOLAPAXY N/A 03/18/2021   Procedure: CYSTOSCOPY WITH LITHOLAPAXY;  Surgeon: Timothy Come, MD;  Location: [facility] ORS;  Service: Urology;  Laterality: N/A;   ESOPHAGOGASTRODUODENOSCOPY (EGD) WITH PROPOFOL N/A 05/27/2015   Procedure: ESOPHAGOGASTRODUODENOSCOPY (EGD) WITH PROPOFOL;  Surgeon: Timothy Cullens, MD;  Location: [facility] ENDOSCOPY;  Service: Gastroenterology;  Laterality: N/A;   HOLEP-LASER ENUCLEATION OF THE PROSTATE WITH MORCELLATION N/A 10/17/2019   Procedure: HOLEP-LASER ENUCLEATION OF THE PROSTATE WITH MORCELLATION;  Surgeon: Timothy Come, MD;  Location: [facility] ORS;  Service: Urology;  Laterality: N/A;   VASECTOMY      Prior to Admission medications   Medication Sig Start Date End Date Taking? Authorizing Provider  atorvastatin (LIPITOR) 80 MG tablet Take 1 tablet (80 mg total) by mouth daily. 01/06/16  Yes Timothy Blinks, MD  metoprolol succinate (TOPROL-XL) 50 MG 24 hr tablet Take 1 tablet by mouth daily. 03/15/22  Yes [provider]  montelukast (SINGULAIR) 10 MG tablet Take 1 tablet by mouth at bedtime. 04/07/22 04/07/23 Yes [provider]  polyethylene glycol (MIRALAX / GLYCOLAX) 17 g packet Take 17 g by mouth daily as needed.   Yes [provider]  Vibegron (GEMTESA) 75 MG TABS Take 1 tablet (75 mg total)  by mouth daily. 08/24/22  Yes Timothy Come, MD  clopidogrel (PLAVIX) 75 MG tablet Take 1 tablet (75 mg total) by mouth daily with breakfast. Patient not taking: Reported on 10/23/2022 01/06/16   Timothy Blinks, MD    Allergies as of 09/21/2022 - Review Complete 08/24/2022  Allergen Reaction Noted   Aspirin  02/17/2013   Peanuts [peanut oil] Itching 10/07/2014    Family History  Family history unknown: Yes    Social  History   Socioeconomic History   Marital status: Married    Spouse name: Timothy Bass   Number of children: 3   Years of education: Not on file   Highest education level: Not on file  Occupational History   Occupation: Retired  Tobacco Use   Smoking status: Former    Packs/day: 1.50    Years: 20.00    Additional pack years: 0.00    Total pack years: 30.00    Types: Cigarettes    Quit date: 05/22/1993    Years since quitting: 29.4    Passive exposure: Past   Smokeless tobacco: Never  Vaping Use   Vaping Use: Never used  Substance and Sexual Activity   Alcohol use: No    Alcohol/week: 0.0 standard drinks of alcohol   Drug use: No   Sexual activity: Not Currently    Partners: Female    Birth control/protection: None  Other Topics Concern   Not on file  Social History Narrative   Not on file   Social Determinants of Health   Financial Resource Strain: Not on file  Food Insecurity: Not on file  Transportation Needs: Not on file  Physical Activity: Not on file  Stress: Not on file  Social Connections: Not on file  Intimate Partner Violence: Not on file    Review of Systems: See HPI, otherwise negative ROS  Physical Exam: Ht 5\' 4"  (1.626 m)   Wt 79.4 kg   BMI 30.04 kg/m  General:   Alert, cooperative in NAD Head:  Normocephalic and atraumatic. Respiratory:  Normal work of breathing. Cardiovascular:  RRR  Impression/Plan: Timothy Bass is here for cataract surgery.  Risks, benefits, limitations, and alternatives regarding cataract surgery have been reviewed with the patient.  Questions have been answered.  All parties agreeable.   Timothy Pandy, MD  10/30/2022, 7:15 AM

## 2022-10-30 NOTE — Anesthesia Postprocedure Evaluation (Signed)
Anesthesia Post Note  Patient: Timothy Bass  Procedure(s) Performed: CATARACT EXTRACTION PHACO AND INTRAOCULAR LENS PLACEMENT (IOC) RIGHT  10.55  01:05.6 (Right: Eye)  Patient location during evaluation: PACU Anesthesia Type: MAC Level of consciousness: awake and alert Pain management: pain level controlled Vital Signs Assessment: post-procedure vital signs reviewed and stable Respiratory status: spontaneous breathing, nonlabored ventilation, respiratory function stable and patient connected to nasal cannula oxygen Cardiovascular status: stable and blood pressure returned to baseline Postop Assessment: no apparent nausea or vomiting Anesthetic complications: no   No notable events documented.   Last Vitals:  Vitals:   10/30/22 1053 10/30/22 1058  BP: (!) 182/77 (!) 184/71  Pulse: 60 (!) 59  Resp: 18 16  Temp: (!) 36.1 C (!) 36.4 C  SpO2: 98% 96%    Last Pain:  Vitals:   10/30/22 1058  TempSrc:   PainSc: 0-No pain                 [name]

## 2022-10-31 ENCOUNTER — Encounter: Payer: Self-pay | Admitting: Ophthalmology

## 2022-11-01 ENCOUNTER — Encounter: Payer: Self-pay | Admitting: Ophthalmology

## 2022-11-03 ENCOUNTER — Encounter: Payer: Self-pay | Admitting: Anesthesiology

## 2022-11-03 NOTE — Anesthesia Preprocedure Evaluation (Signed)
Anesthesia Evaluation    Airway        Dental   Pulmonary former smoker          Cardiovascular hypertension, + angina  + CAD, + Past MI, + Peripheral Vascular Disease and + DOE  + dysrhythmias   Note: preop hypertension   Graft to Insertion lesion (Saphenous Graft To Dist Cx) Angioplasty Lesion crossed with guidewire. Pre-stent angioplasty was performed using a BALLOON TREK RX 2.5X12. A drug eluting stent was successfully placed, and does not overlap previously placed stent. Stent strut is well apposed. Post-stent angioplasty was not performed. The pre-interventional distal flow is decreased (TIMI 2). The post-interventional distal flow is normal (TIMI 3). The intervention was successful . No complications occurred at this lesion. Pressure wire/FFR was not performed on the lesion . IVUS was not performed on the lesion . There is a 0% residual stenosis post intervention. 01-05-16    Neuro/Psych    GI/Hepatic ,GERD  ,,  Endo/Other    Renal/GU Renal disease     Musculoskeletal  (+) Arthritis ,    Abdominal   Peds  Hematology   Anesthesia Other Findings Hyperlipidemia  BPH without obstruction Hypertension  Dysuria Nocturia  Urinary incontinence Urinary frequency  Coronary artery disease Prediabetes  Prostatic hypertrophy Hypercholesteremia  GERD (gastroesophageal reflux disease) Myocardial infarction Arthritis Chronic constipation  Previous cataract 10-30-22 Acquired trigger finger of both middle fingers DOE (dyspnea on exertion)  History of 2019 Covid Long term current use of antithrombotics/antiplatelets   S/P CABG x 3 Nephrolithiasis  PVD (peripheral vascular disease)  Aortic atherosclerosis  RBBB (right bundle branch block) Cholelithiasis     Reproductive/Obstetrics                              Anesthesia Physical Anesthesia Plan Anesthesia Quick Evaluation

## 2022-11-06 NOTE — Discharge Instructions (Signed)

## 2022-11-08 NOTE — H&P (Signed)
[facility]   Primary Care Physician:  Dorothey Baseman, MD Ophthalmologist: Dr. Deberah Pelton  Pre-Procedure History & Physical: HPI:  Timothy Bass is a 89 y.o. male here for cataract surgery.   Past Medical History:  Diagnosis Date   Acquired trigger finger of both middle fingers    Aortic atherosclerosis (HCC)    Arthritis    BPH without obstruction/lower urinary tract symptoms    Cholelithiasis    Chronic constipation    Coronary artery disease    a.) 3v CABG (LIMA-LAD, SVG-D1, SVG-dLCx) in 1995. b.) LHC 01/05/2016: EF 55%; 100% pLAD, 100% oD1, 100% dLAD, 30% p-mLCx, 65% dLCx-1, 100% dLCx-2, 95% distal graft lesion. Atretic LIMA-LAD; patent SVG-D1; sig disease SVG-PDA. 2.75 x 12 mm Resolute Integrity DES x 1 to dSVG-PDA graft.   DOE (dyspnea on exertion)    Dysuria    GERD (gastroesophageal reflux disease)    History of 2019 novel coronavirus disease (COVID-19) 05/28/2020   Hypercholesteremia    Hyperlipidemia    Hypertension    Long term current use of antithrombotics/antiplatelets    a.) Clopidogrel   Myocardial infarction (HCC) 1995   Nephrolithiasis    Nocturia    Prediabetes    Prostatic hypertrophy    PVD (peripheral vascular disease) (HCC)    RBBB (right bundle branch block)    S/P CABG x 3 1995   a.) 3v CABG (LIMA-LAD, SVG-D1, SVG-dLCx)   Urinary frequency    Urinary incontinence     Past Surgical History:  Procedure Laterality Date   CARDIAC CATHETERIZATION N/A 01/05/2016   Procedure: LEFT HEART CATH AND CORS/GRAFTS ANGIOGRAPHY;  Surgeon: Lamar Blinks, MD;  Location: [facility] INVASIVE CV LAB;  Service: Cardiovascular;  Laterality: N/A;   CARDIAC CATHETERIZATION N/A 01/05/2016   Procedure: Coronary Stent Intervention (2.75 x 12 mm Resolute Intregity DES x1 to dSVG-PDA graft);  Surgeon: Marcina Millard, MD;  Location: [facility] INVASIVE CV LAB;  Service: Cardiovascular;  Laterality: N/A;   CATARACT EXTRACTION W/PHACO Right 10/30/2022   Procedure: CATARACT  EXTRACTION PHACO AND INTRAOCULAR LENS PLACEMENT (IOC) RIGHT  10.55  01:05.6;  Surgeon: Estanislado Pandy, MD;  Location: [facility];  Service: Ophthalmology;  Laterality: Right;   COLONOSCOPY WITH PROPOFOL N/A 07/02/2017   Procedure: COLONOSCOPY WITH PROPOFOL;  Surgeon: Christena Deem, MD;  Location: [facility] ENDOSCOPY;  Service: Endoscopy;  Laterality: N/A;   CORONARY ARTERY BYPASS GRAFT  1995   Procedure: 3v CABG (LIMA-LAD, SVG-D1, SVG-dLCx)   CYSTOSCOPY WITH LITHOLAPAXY N/A 03/18/2021   Procedure: CYSTOSCOPY WITH LITHOLAPAXY;  Surgeon: Sondra Come, MD;  Location: [facility] ORS;  Service: Urology;  Laterality: N/A;   ESOPHAGOGASTRODUODENOSCOPY (EGD) WITH PROPOFOL N/A 05/27/2015   Procedure: ESOPHAGOGASTRODUODENOSCOPY (EGD) WITH PROPOFOL;  Surgeon: Wallace Cullens, MD;  Location: [facility] ENDOSCOPY;  Service: Gastroenterology;  Laterality: N/A;   HOLEP-LASER ENUCLEATION OF THE PROSTATE WITH MORCELLATION N/A 10/17/2019   Procedure: HOLEP-LASER ENUCLEATION OF THE PROSTATE WITH MORCELLATION;  Surgeon: Sondra Come, MD;  Location: [facility] ORS;  Service: Urology;  Laterality: N/A;   VASECTOMY      Prior to Admission medications   Medication Sig Start Date End Date Taking? Authorizing Provider  atorvastatin (LIPITOR) 80 MG tablet Take 1 tablet (80 mg total) by mouth daily. 01/06/16  Yes Lamar Blinks, MD  metoprolol succinate (TOPROL-XL) 50 MG 24 hr tablet Take 1 tablet by mouth daily. 03/15/22  Yes [provider]  montelukast (SINGULAIR) 10 MG tablet Take 1 tablet by mouth at bedtime. 04/07/22  04/07/23 Yes [provider]  polyethylene glycol (MIRALAX / GLYCOLAX) 17 g packet Take 17 g by mouth daily as needed.   Yes [provider]  Vibegron (GEMTESA) 75 MG TABS Take 1 tablet (75 mg total) by mouth daily. 08/24/22  Yes Sondra Come, MD  clopidogrel (PLAVIX) 75 MG tablet Take 1 tablet (75 mg total) by mouth daily with breakfast. Patient not taking: Reported on  10/23/2022 01/06/16   Lamar Blinks, MD    Allergies as of 09/21/2022 - Review Complete 08/24/2022  Allergen Reaction Noted   Aspirin  02/17/2013   Peanuts [peanut oil] Itching 10/07/2014    Family History  Family history unknown: Yes    Social History   Socioeconomic History   Marital status: Married    Spouse name: [name]   Number of children: 3   Years of education: Not on file   Highest education level: Not on file  Occupational History   Occupation: Retired  Tobacco Use   Smoking status: Former    Packs/day: 1.50    Years: 20.00    Additional pack years: 0.00    Total pack years: 30.00    Types: Cigarettes    Quit date: 05/22/1993    Years since quitting: 29.4    Passive exposure: Past   Smokeless tobacco: Never  Vaping Use   Vaping Use: Never used  Substance and Sexual Activity   Alcohol use: No    Alcohol/week: 0.0 standard drinks of alcohol   Drug use: No   Sexual activity: Not Currently    Partners: Female    Birth control/protection: None  Other Topics Concern   Not on file  Social History Narrative   Not on file   Social Determinants of Health   Financial Resource Strain: Not on file  Food Insecurity: Not on file  Transportation Needs: Not on file  Physical Activity: Not on file  Stress: Not on file  Social Connections: Not on file  Intimate Partner Violence: Not on file    Review of Systems: See HPI, otherwise negative ROS  Physical Exam: There were no vitals taken for this visit. General:   Alert, cooperative in NAD Head:  Normocephalic and atraumatic. Respiratory:  Normal work of breathing. Cardiovascular:  RRR  Impression/Plan: Timothy Bass is here for cataract surgery.  Risks, benefits, limitations, and alternatives regarding cataract surgery have been reviewed with the patient.  Questions have been answered.  All parties agreeable.   Estanislado Pandy, MD  11/08/2022, 7:05 AM

## 2022-12-01 ENCOUNTER — Ambulatory Visit: Payer: Medicare Other | Admitting: Physician Assistant

## 2022-12-13 ENCOUNTER — Ambulatory Visit: Payer: 59 | Admitting: Anesthesiology

## 2022-12-13 ENCOUNTER — Other Ambulatory Visit: Payer: Self-pay

## 2022-12-13 ENCOUNTER — Ambulatory Visit
Admission: RE | Admit: 2022-12-13 | Discharge: 2022-12-13 | Disposition: A | Discharge status: Home / Self Care | Payer: 59 | Attending: Ophthalmology | Admitting: Ophthalmology

## 2022-12-13 ENCOUNTER — Encounter
Admission: RE | Disposition: A | Discharge status: Home / Self Care | Payer: Self-pay | Source: Home / Self Care | Attending: Ophthalmology

## 2022-12-13 ENCOUNTER — Encounter: Payer: Self-pay | Admitting: Ophthalmology

## 2022-12-13 DIAGNOSIS — M1990 Unspecified osteoarthritis, unspecified site: Secondary | ICD-10-CM | POA: Insufficient documentation

## 2022-12-13 DIAGNOSIS — I252 Old myocardial infarction: Secondary | ICD-10-CM | POA: Insufficient documentation

## 2022-12-13 DIAGNOSIS — H2512 Age-related nuclear cataract, left eye: Secondary | ICD-10-CM | POA: Diagnosis present

## 2022-12-13 DIAGNOSIS — I25119 Atherosclerotic heart disease of native coronary artery with unspecified angina pectoris: Secondary | ICD-10-CM | POA: Diagnosis not present

## 2022-12-13 DIAGNOSIS — I739 Peripheral vascular disease, unspecified: Secondary | ICD-10-CM | POA: Insufficient documentation

## 2022-12-13 DIAGNOSIS — Z87891 Personal history of nicotine dependence: Secondary | ICD-10-CM | POA: Diagnosis not present

## 2022-12-13 DIAGNOSIS — Z955 Presence of coronary angioplasty implant and graft: Secondary | ICD-10-CM | POA: Diagnosis not present

## 2022-12-13 DIAGNOSIS — I10 Essential (primary) hypertension: Secondary | ICD-10-CM | POA: Diagnosis not present

## 2022-12-13 HISTORY — PX: CATARACT EXTRACTION W/PHACO: SHX586

## 2022-12-13 SURGERY — PHACOEMULSIFICATION, CATARACT, WITH IOL INSERTION
Anesthesia: Monitor Anesthesia Care | Site: Eye | Laterality: Left

## 2022-12-13 MED ORDER — LACTATED RINGERS IV SOLN: INTRAVENOUS | Status: DC

## 2022-12-13 MED ORDER — MIDAZOLAM HCL 2 MG/2ML IJ SOLN
INTRAMUSCULAR | Status: DC | PRN
  Administered 2022-12-13: 1 mg via INTRAVENOUS

## 2022-12-13 MED ORDER — SIGHTPATH DOSE#1 NA HYALUR & NA CHOND-NA HYALUR IO KIT
PACK | INTRAOCULAR | Status: DC | PRN
  Administered 2022-12-13: 1 via OPHTHALMIC

## 2022-12-13 MED ORDER — MOXIFLOXACIN HCL 0.5 % OP SOLN
OPHTHALMIC | Status: DC | PRN
  Administered 2022-12-13: .2 mL via OPHTHALMIC

## 2022-12-13 MED ORDER — FENTANYL CITRATE (PF) 100 MCG/2ML IJ SOLN
INTRAMUSCULAR | Status: DC | PRN
  Administered 2022-12-13: 25 ug via INTRAVENOUS

## 2022-12-13 MED ORDER — SIGHTPATH DOSE#1 BSS IO SOLN
INTRAOCULAR | Status: DC | PRN
  Administered 2022-12-13: 78 mL via OPHTHALMIC

## 2022-12-13 MED ORDER — TETRACAINE HCL 0.5 % OP SOLN
1.0000 [drp] | OPHTHALMIC | Status: DC | PRN
  Administered 2022-12-13 (×3): 1 [drp] via OPHTHALMIC

## 2022-12-13 MED ORDER — ARMC OPHTHALMIC DILATING DROPS
1.0000 | OPHTHALMIC | Status: DC | PRN
  Administered 2022-12-13 (×3): 1 via OPHTHALMIC

## 2022-12-13 MED ORDER — LIDOCAINE HCL (PF) 2 % IJ SOLN
INTRAOCULAR | Status: DC | PRN
  Administered 2022-12-13: 1 mL via INTRAOCULAR

## 2022-12-13 MED ORDER — SIGHTPATH DOSE#1 BSS IO SOLN
INTRAOCULAR | Status: DC | PRN
  Administered 2022-12-13: 15 mL

## 2022-12-13 MED ORDER — BRIMONIDINE TARTRATE-TIMOLOL 0.2-0.5 % OP SOLN
OPHTHALMIC | Status: DC | PRN
  Administered 2022-12-13: 1 [drp] via OPHTHALMIC

## 2022-12-13 SURGICAL SUPPLY — 9 items
CATARACT SUITE SIGHTPATH (MISCELLANEOUS) ×1 IMPLANT
DISSECTOR HYDRO NUCLEUS 50X22 (MISCELLANEOUS) ×1 IMPLANT
DRSG TEGADERM 2-3/8X2-3/4 SM (GAUZE/BANDAGES/DRESSINGS) ×1 IMPLANT
FEE CATARACT SUITE SIGHTPATH (MISCELLANEOUS) ×1 IMPLANT
GLOVE SURG SYN 7.5 E (GLOVE) ×1 IMPLANT
GLOVE SURG SYN 7.5 PF PI (GLOVE) ×1 IMPLANT
GLOVE SURG SYN 8.5 E (GLOVE) ×1 IMPLANT
GLOVE SURG SYN 8.5 PF PI (GLOVE) ×1 IMPLANT
LENS IOL TECNIS EYHANCE 20.0 (Intraocular Lens) IMPLANT

## 2022-12-13 NOTE — Anesthesia Preprocedure Evaluation (Signed)
Anesthesia Evaluation  Patient identified by MRN, date of birth, ID band Patient awake  General Assessment Comment:  Patient very hard of hearing. No health changes since last anesthetic couple weeks ago here.  Reviewed: Allergy & Precautions, H&P , NPO status , Patient's Chart, lab work & pertinent test results  Airway Mallampati: III  TM Distance: >3 FB Neck ROM: Full    Dental  (+) Upper Dentures, Lower Dentures   Pulmonary former smoker   Pulmonary exam normal breath sounds clear to auscultation       Cardiovascular hypertension, + angina  + CAD, + Past MI, + Peripheral Vascular Disease and + DOE  Normal cardiovascular exam+ dysrhythmias  Rhythm:Regular Rate:Normal  Note: preop hypertension  ist Graft to Insertion lesion (Saphenous Graft To Dist Cx) Angioplasty Lesion crossed with guidewire. Pre-stent angioplasty was performed using a BALLOON TREK RX 2.5X12. A drug eluting stent was successfully placed, and does not overlap previously placed stent. Stent strut is well apposed. Post-stent angioplasty was not performed. The pre-interventional distal flow is decreased (TIMI 2). The post-interventional distal flow is normal (TIMI 3). The intervention was successful . No complications occurred at this lesion. Pressure wire/FFR was not performed on the lesion . IVUS was not performed on the lesion . There is a 0% residual stenosis post intervention. 01-05-16    Neuro/Psych negative neurological ROS  negative psych ROS   GI/Hepatic negative GI ROS, Neg liver ROS,GERD  ,,  Endo/Other  negative endocrine ROS    Renal/GU Renal disease  negative genitourinary   Musculoskeletal  (+) Arthritis ,    Abdominal   Peds negative pediatric ROS (+)  Hematology negative hematology ROS (+)   Anesthesia Other Findings Hyperlipidemia  BPH without obstruction/lower urinary tract symptoms Hypertension  Dysuria Nocturia  Urinary  incontinence Urinary frequency  Coronary artery disease Prediabetes Prostatic hypertrophy Hypercholesteremia GERD (gastroesophageal reflux disease) Myocardial infarction (HCC)  Arthritis Chronic constipation  Acquired trigger finger of both middle fingers DOE (dyspnea on exertion)  History of 2019 (COVID-19) Long term current use of antithrombotics/antiplatelets  S/P CABG x 3 Nephrolithiasis  PVD (peripheral vascular disease) (HCC) Aortic atherosclerosis (HCC)  RBBB (right bundle branch block) Cholelithiasis     Reproductive/Obstetrics negative OB ROS                             Anesthesia Physical Anesthesia Plan  ASA: 3  Anesthesia Plan: MAC   Post-op Pain Management: Minimal or no pain anticipated   Induction: Intravenous  PONV Risk Score and Plan: 1 and Midazolam  Airway Management Planned: Natural Airway and Nasal Cannula  Additional Equipment:   Intra-op Plan:   Post-operative Plan:   Informed Consent: I have reviewed the patients History and Physical, chart, labs and discussed the procedure including the risks, benefits and alternatives for the proposed anesthesia with the patient or authorized representative who has indicated his/her understanding and acceptance.     Dental Advisory Given  Plan Discussed with: Anesthesiologist, CRNA and Surgeon  Anesthesia Plan Comments: (Adult son at bedside acting as Nurse, learning disability; patient and son are OK with and request this. Explained risks of anesthesia, including PONV, and rare emergencies such as cardiac events, respiratory problems, and allergic reactions, requiring invasive intervention. Discussed the role of CRNA in patient's perioperative care. Patient understands. )        Anesthesia Quick Evaluation

## 2022-12-13 NOTE — Anesthesia Postprocedure Evaluation (Signed)
Anesthesia Post Note  Patient: Timothy Bass  Procedure(s) Performed: CATARACT EXTRACTION PHACO AND INTRAOCULAR LENS PLACEMENT (IOC) LEFT  5.19  00:46.9 (Left: Eye)  Patient location during evaluation: PACU Anesthesia Type: MAC Level of consciousness: awake and alert Pain management: pain level controlled Vital Signs Assessment: post-procedure vital signs reviewed and stable Respiratory status: spontaneous breathing, nonlabored ventilation, respiratory function stable and patient connected to nasal cannula oxygen Cardiovascular status: stable and blood pressure returned to baseline Postop Assessment: no apparent nausea or vomiting Anesthetic complications: no   No notable events documented.   Last Vitals:  Vitals:   12/13/22 1110 12/13/22 1115  BP: (!) 169/81 (!) 158/83  Pulse: 63 62  Resp: 18 16  Temp: (!) 36.1 C 36.4 C  SpO2: 95% 96%    Last Pain:  Vitals:   12/13/22 1115  TempSrc:   PainSc: 0-No pain                 Corinda Gubler

## 2022-12-13 NOTE — Transfer of Care (Signed)
Immediate Anesthesia Transfer of Care Note  Patient: Timothy Bass  Procedure(s) Performed: CATARACT EXTRACTION PHACO AND INTRAOCULAR LENS PLACEMENT (IOC) LEFT  5.19  00:46.9 (Left: Eye)  Patient Location: PACU  Anesthesia Type: MAC  Level of Consciousness: awake, alert  and patient cooperative  Airway and Oxygen Therapy: Patient Spontanous Breathing and Patient connected to supplemental oxygen  Post-op Assessment: Post-op Vital signs reviewed, Patient's Cardiovascular Status Stable, Respiratory Function Stable, Patent Airway and No signs of Nausea or vomiting  Post-op Vital Signs: Reviewed and stable  Complications: No notable events documented.

## 2022-12-13 NOTE — H&P (Signed)
[facility]   Primary Care Physician:  Dorothey Baseman, MD Ophthalmologist: Dr. Deberah Pelton  Pre-Procedure History & Physical: HPI:  Timothy Bass is a 89 y.o. male here for cataract surgery.   Past Medical History:  Diagnosis Date   Acquired trigger finger of both middle fingers    Aortic atherosclerosis (HCC)    Arthritis    BPH without obstruction/lower urinary tract symptoms    Cholelithiasis    Chronic constipation    Coronary artery disease    a.) 3v CABG (LIMA-LAD, SVG-D1, SVG-dLCx) in 1995. b.) LHC 01/05/2016: EF 55%; 100% pLAD, 100% oD1, 100% dLAD, 30% p-mLCx, 65% dLCx-1, 100% dLCx-2, 95% distal graft lesion. Atretic LIMA-LAD; patent SVG-D1; sig disease SVG-PDA. 2.75 x 12 mm Resolute Integrity DES x 1 to dSVG-PDA graft.   DOE (dyspnea on exertion)    Dysuria    GERD (gastroesophageal reflux disease)    History of 2019 novel coronavirus disease (COVID-19) 05/28/2020   Hypercholesteremia    Hyperlipidemia    Hypertension    Long term current use of antithrombotics/antiplatelets    a.) Clopidogrel   Myocardial infarction (HCC) 1995   Nephrolithiasis    Nocturia    Prediabetes    Prostatic hypertrophy    PVD (peripheral vascular disease) (HCC)    RBBB (right bundle branch block)    S/P CABG x 3 1995   a.) 3v CABG (LIMA-LAD, SVG-D1, SVG-dLCx)   Urinary frequency    Urinary incontinence     Past Surgical History:  Procedure Laterality Date   CARDIAC CATHETERIZATION N/A 01/05/2016   Procedure: LEFT HEART CATH AND CORS/GRAFTS ANGIOGRAPHY;  Surgeon: Lamar Blinks, MD;  Location: [facility] INVASIVE CV LAB;  Service: Cardiovascular;  Laterality: N/A;   CARDIAC CATHETERIZATION N/A 01/05/2016   Procedure: Coronary Stent Intervention (2.75 x 12 mm Resolute Intregity DES x1 to dSVG-PDA graft);  Surgeon: Marcina Millard, MD;  Location: [facility] INVASIVE CV LAB;  Service: Cardiovascular;  Laterality: N/A;   CATARACT EXTRACTION W/PHACO Right 10/30/2022   Procedure: CATARACT  EXTRACTION PHACO AND INTRAOCULAR LENS PLACEMENT (IOC) RIGHT  10.55  01:05.6;  Surgeon: Estanislado Pandy, MD;  Location: [facility];  Service: Ophthalmology;  Laterality: Right;   COLONOSCOPY WITH PROPOFOL N/A 07/02/2017   Procedure: COLONOSCOPY WITH PROPOFOL;  Surgeon: Christena Deem, MD;  Location: [facility] ENDOSCOPY;  Service: Endoscopy;  Laterality: N/A;   CORONARY ARTERY BYPASS GRAFT  1995   Procedure: 3v CABG (LIMA-LAD, SVG-D1, SVG-dLCx)   CYSTOSCOPY WITH LITHOLAPAXY N/A 03/18/2021   Procedure: CYSTOSCOPY WITH LITHOLAPAXY;  Surgeon: Sondra Come, MD;  Location: [facility] ORS;  Service: Urology;  Laterality: N/A;   ESOPHAGOGASTRODUODENOSCOPY (EGD) WITH PROPOFOL N/A 05/27/2015   Procedure: ESOPHAGOGASTRODUODENOSCOPY (EGD) WITH PROPOFOL;  Surgeon: Wallace Cullens, MD;  Location: [facility] ENDOSCOPY;  Service: Gastroenterology;  Laterality: N/A;   HOLEP-LASER ENUCLEATION OF THE PROSTATE WITH MORCELLATION N/A 10/17/2019   Procedure: HOLEP-LASER ENUCLEATION OF THE PROSTATE WITH MORCELLATION;  Surgeon: Sondra Come, MD;  Location: [facility] ORS;  Service: Urology;  Laterality: N/A;   VASECTOMY      Prior to Admission medications   Medication Sig Start Date End Date Taking? Authorizing Provider  atorvastatin (LIPITOR) 80 MG tablet Take 1 tablet (80 mg total) by mouth daily. 01/06/16  Yes Lamar Blinks, MD  metoprolol succinate (TOPROL-XL) 50 MG 24 hr tablet Take 1 tablet by mouth daily. 03/15/22  Yes [provider]  montelukast (SINGULAIR) 10 MG tablet Take 1 tablet by mouth at bedtime. 04/07/22  04/07/23 Yes [provider]  polyethylene glycol (MIRALAX / GLYCOLAX) 17 g packet Take 17 g by mouth daily as needed.   Yes [provider]  Vibegron (GEMTESA) 75 MG TABS Take 1 tablet (75 mg total) by mouth daily. 08/24/22  Yes Sondra Come, MD  clopidogrel (PLAVIX) 75 MG tablet Take 1 tablet (75 mg total) by mouth daily with breakfast. Patient not taking: Reported on  10/23/2022 01/06/16   Lamar Blinks, MD    Allergies as of 09/21/2022 - Review Complete 08/24/2022  Allergen Reaction Noted   Aspirin  02/17/2013   Peanuts [peanut oil] Itching 10/07/2014    Family History  Family history unknown: Yes    Social History   Socioeconomic History   Marital status: Married    Spouse name: [name]   Number of children: 3   Years of education: Not on file   Highest education level: Not on file  Occupational History   Occupation: Retired  Tobacco Use   Smoking status: Former    Current packs/day: 0.00    Average packs/day: 1.5 packs/day for 20.0 years (30.0 ttl pk-yrs)    Types: Cigarettes    Start date: 05/22/1973    Quit date: 05/22/1993    Years since quitting: 29.5    Passive exposure: Past   Smokeless tobacco: Never  Vaping Use   Vaping status: Never Used  Substance and Sexual Activity   Alcohol use: No    Alcohol/week: 0.0 standard drinks of alcohol   Drug use: No   Sexual activity: Not Currently    Partners: Female    Birth control/protection: None  Other Topics Concern   Not on file  Social History Narrative   Not on file   Social Determinants of Health   Financial Resource Strain: Patient Declined (03/15/2022)   Received from [facility], [facility]   Overall Financial Resource Strain (CARDIA)    Difficulty of Paying Living Expenses: Patient declined  Food Insecurity: Patient Declined (03/15/2022)   Received from [facility], [facility]   Hunger Vital Sign    Worried About Running Out of Food in the Last Year: Patient declined    Ran Out of Food in the Last Year: Patient declined  Transportation Needs: Patient Declined (03/15/2022)   Received from [facility], [facility]   PRAPARE - Transportation    In the past 12 months, has lack of transportation kept you from medical appointments or from getting medications?:  Patient declined    Lack of Transportation (Non-Medical): Patient declined  Physical Activity: Not on file  Stress: Not on file  Social Connections: Not on file  Intimate Partner Violence: Not on file    Review of Systems: See HPI, otherwise negative ROS  Physical Exam: There were no vitals taken for this visit. General:   Alert, cooperative in NAD Head:  Normocephalic and atraumatic. Respiratory:  Normal work of breathing. Cardiovascular:  RRR  Impression/Plan: MANSOOR HILLYARD is here for cataract surgery.  Risks, benefits, limitations, and alternatives regarding cataract surgery have been reviewed with the patient.  Questions have been answered.  All parties agreeable.   Estanislado Pandy, MD  12/13/2022, 7:11 AM

## 2022-12-13 NOTE — Op Note (Signed)
OPERATIVE NOTE  Timothy Bass [id number] 12/13/2022   PREOPERATIVE DIAGNOSIS: Nuclear sclerotic cataract left eye. H25.12   POSTOPERATIVE DIAGNOSIS: Nuclear sclerotic cataract left eye. H25.12   PROCEDURE:  Phacoemusification with posterior chamber intraocular lens placement of the left eye  Ultrasound time: Procedure(s): CATARACT EXTRACTION PHACO AND INTRAOCULAR LENS PLACEMENT (IOC) LEFT  5.19  00:46.9 (Left)  LENS:   Implant Name Type Inv. Item Serial No. Manufacturer Lot No. LRB No. Used Action  LENS IOL TECNIS EYHANCE 20.0 - [id number] Intraocular Lens LENS IOL TECNIS EYHANCE 20.0 [id number] [employer]  Left 1 Implanted      SURGEON:  Julious Payer. Rolley Sims, MD   ANESTHESIA:  Topical with tetracaine drops, augmented with 1% preservative-free intracameral lidocaine.   COMPLICATIONS:  None.   DESCRIPTION OF PROCEDURE:  The patient was identified in the holding room and transported to the operating room and placed in the supine position under the operating microscope.  The left eye was identified as the operative eye, which was prepped and draped in the usual sterile ophthalmic fashion.   A 1 millimeter clear-corneal paracentesis was made inferotemporally. Preservative-free 1% lidocaine mixed with 1:1,000 bisulfite-free aqueous solution of epinephrine was injected into the anterior chamber. The anterior chamber was then filled with Viscoat viscoelastic. A 2.4 millimeter keratome was used to make a clear-corneal incision superotemporally. A curvilinear capsulorrhexis was made with a cystotome and capsulorrhexis forceps. Balanced salt solution was used to hydrodissect and hydrodelineate the nucleus. Phacoemulsification was then used to remove the lens nucleus and epinucleus. The remaining cortex was then removed using the irrigation and aspiration handpiece. Provisc was then placed into the capsular bag to distend it for lens placement. A +20.00 D DIB00 intraocular lens was then injected into the  capsular bag. The remaining viscoelastic was aspirated.   Wounds were hydrated with balanced salt solution.  The anterior chamber was inflated to a physiologic pressure with balanced salt solution.  No wound leaks were noted. Vigamox was injected intracamerally.  Timolol and Brimonidine drops were applied to the eye.  The patient was taken to the recovery room in stable condition without complications of anesthesia or surgery.  Timothy Bass 12/13/2022, 11:09 AM

## 2022-12-14 ENCOUNTER — Encounter: Payer: Self-pay | Admitting: Ophthalmology

## 2022-12-21 ENCOUNTER — Ambulatory Visit: Payer: 59 | Admitting: Physician Assistant

## 2023-06-01 ENCOUNTER — Telehealth: Payer: Self-pay

## 2023-06-01 NOTE — Telephone Encounter (Signed)
 Incoming letter from [employer] stating that they will no longer cover [place]. (See media)

## 2023-06-26 ENCOUNTER — Other Ambulatory Visit: Payer: Self-pay | Admitting: Urology

## 2023-07-13 DIAGNOSIS — K5909 Other constipation: Secondary | ICD-10-CM | POA: Diagnosis not present

## 2023-08-23 ENCOUNTER — Ambulatory Visit (INDEPENDENT_AMBULATORY_CARE_PROVIDER_SITE_OTHER): Payer: Self-pay | Admitting: Urology

## 2023-08-23 VITALS — BP 165/77 | HR 79 | Ht 67.0 in | Wt 175.0 lb

## 2023-08-23 DIAGNOSIS — R399 Unspecified symptoms and signs involving the genitourinary system: Secondary | ICD-10-CM

## 2023-08-23 DIAGNOSIS — N3281 Overactive bladder: Secondary | ICD-10-CM | POA: Diagnosis not present

## 2023-08-23 DIAGNOSIS — N138 Other obstructive and reflux uropathy: Secondary | ICD-10-CM

## 2023-08-23 DIAGNOSIS — N401 Enlarged prostate with lower urinary tract symptoms: Secondary | ICD-10-CM | POA: Diagnosis not present

## 2023-08-23 LAB — BLADDER SCAN AMB NON-IMAGING: Scan Result: 104

## 2023-08-23 MED ORDER — TROSPIUM CHLORIDE ER 60 MG PO CP24: 60.0000 mg | ORAL_CAPSULE | Freq: Every day | ORAL | 11 refills | Status: DC

## 2023-08-23 NOTE — Progress Notes (Signed)
   08/23/2023 2:01 PM   Timothy Bass September 04, 1932 [id number]  Reason for visit: Follow up BPH, OAB  HPI: Today's visit conducted with translator.  89 year old male who underwent HOLEP in May 2021 for a long history of obstructive symptoms, UTIs, and elevated PVRs >245ml.  He had been doing well before developing some dysuria and urinary frequency and was actually found to have a 1.5 cm bladder stone in August 2022 and underwent a cystolitholapaxy in October 2022.  Prostatic fossa was wide open at that time.  After removal of bladder stone, he had had resolution of his dysuria, but had some residual bothersome urgency, frequency, and nocturia.  He did not do well on Myrbetriq secondary to hypertension, but was doing very well on [place].  Unfortunately Gemtesa no longer covered by his insurance and he is interested in other options.  He also had an episode of mildly elevated PVR and microscopic hematuria and one of our PAs ordered a CT urogram and cystoscopy.  CT was benign, cystoscopy February 2024 was benign.  Today he reports worsening of his urinary frequency off of the [place], denies any incontinence.  PVR today is normal at .  I recommended a trial of trospium 60 mg XL daily, and risks and benefits were discussed  Trial of trospium 60 mg XL to replace [place], RTC 3 months PVR  Sondra Come, MD  [facility] [address], [address] [place], [place] [address] [phone]

## 2023-09-25 ENCOUNTER — Telehealth: Payer: Self-pay | Admitting: Urology

## 2023-09-25 DIAGNOSIS — I25810 Atherosclerosis of coronary artery bypass graft(s) without angina pectoris: Secondary | ICD-10-CM | POA: Diagnosis not present

## 2023-09-25 DIAGNOSIS — E782 Mixed hyperlipidemia: Secondary | ICD-10-CM | POA: Diagnosis not present

## 2023-09-25 DIAGNOSIS — Z955 Presence of coronary angioplasty implant and graft: Secondary | ICD-10-CM | POA: Diagnosis not present

## 2023-09-25 DIAGNOSIS — I10 Essential (primary) hypertension: Secondary | ICD-10-CM | POA: Diagnosis not present

## 2023-09-25 NOTE — Telephone Encounter (Signed)
 Pt came into office and requested if there is an coupon or discount that they can receive to get the prescription Gemtesa . Pt states its too expensive. Pt is requesting a call back with options for Gemtesa 

## 2023-09-25 NOTE — Progress Notes (Signed)
 Established Patient Visit   Chief Complaint: Chief Complaint  Patient presents with  . Follow-up    6 month    Date of Service: 09/25/2023 Date of Birth: 07-27-1932 PCP: Glover Alm Hail, MD  History of Present Illness: Mr. Timothy Bass is a 89 y.o.male patient with a history of   CABG x 3, 1995? 2.75 x 12 mm Promus DES of SVG to PDA, 01/05/2016 Hypertension Hyperlipidemia   The patient presents today for a 6 month follow-up, previously followed by Dr. Hester. The patient speaks [place] and [place]. He is offered an interpreter but he declines. There is a language barrier. He denies chest pain or shortness of breath. He denies peripheral edema. He denies palpitations. He does not exercise, but does errands.  The patient underwent ETT Myoview 09/05/2021, exercising for 3:30 minutes on a Bruce protocol, developing chest pressure and shortness of breath. ECG was equivocal due to baseline LAFB. Gated scintigraphy revealed LVEF 52%. SPECT imaging revealed no evidence of scar or ischemia. 2D echocardiogram 09/05/2021 revealed normal left ventricular function with LVEF 50% with moderate LVH with mild aortic, mitral, tricuspid, and pulmonic regurgitation without stenosis.  The patient has hypertension with mildly elevated blood pressure today on valsartan-HCTZ and metoprolol succinate. He states that home blood pressures are also elevated most of the time.   The patient has hyperlipidemia on atorvastatin . LDL cholesterol was 32 on 88/88/7975.   Past Medical and Surgical History  Past Medical History Past Medical History:  Diagnosis Date  . CAD (coronary artery disease)   . COVID-19 04/2020  . Hyperglycemia   . Hyperlipidemia   . Hypertension   . Prostatic hypertrophy   . Reflux esophagitis 05/27/2015    Past Surgical History He has a past surgical history that includes Coronary artery bypass graft (1995); Vasectomy; egd (05/27/2015); and Colonoscopy (07/02/2017).   Medications and  Allergies  Current Medications  Current Outpatient Medications  Medication Sig Dispense Refill  . atorvastatin  (LIPITOR) 80 MG tablet TAKE 1 TABLET BY MOUTH EVERY DAY 90 tablet 3  . azelastine (ASTELIN) 137 mcg nasal spray PLACE 1 SPRAY INTO BOTH NOSTRILS 2 (TWO) TIMES DAILY 30 mL 2  . calcium  polycarbophiL (FIBERCON) 625 mg tablet Take 625 mg by mouth once daily    . cetirizine (ZYRTEC) 10 MG tablet TAKE 1 TABLET (10 MG TOTAL) BY MOUTH ONCE DAILY AS NEEDED FOR ALLERGIES 30 tablet 11  . clopidogreL  (PLAVIX ) 75 mg tablet TAKE 1 TABLET (75 MG TOTAL) BY MOUTH DAILY WITH BREAKFAST. (NO FURTHER REFILLS UNTIL DR VISIT) 90 tablet 3  . GEMTESA  75 mg Tab Take 75 mg by mouth once daily    . L. gasseri-B. bifidum-B longum (PHILLIPS' COLON HEALTH) 1.5 billion cell Cap Take 1 tablet by mouth once daily    . Lactobacillus acidophilus (PROBIOTIC) 10 billion cell Cap Take 1 tablet by mouth once daily    . linaCLOtide (LINZESS) 72 mcg capsule Take 1 capsule (72 mcg total) by mouth once daily 30 capsule 11  . metoprolol SUCCinate (TOPROL-XL) 50 MG XL tablet TAKE 1 TABLET BY MOUTH EVERY DAY 90 tablet 1  . montelukast (SINGULAIR) 10 mg tablet TAKE 1 TABLET BY MOUTH EVERYDAY AT BEDTIME 90 tablet 1  . mv-mn-om3-dha-epa-fish-lut-zea (OCUVITE ADULT 50 PLUS) 250 mg (90 mg-160 mg) Cap Take 1 tablet by mouth once daily    . psyllium (METAMUCIL) 0.52 gram capsule Take 0.52 g by mouth once daily    . valsartan-hydroCHLOROthiazide (DIOVAN-HCT) 160-25 mg tablet Take 1 tablet by mouth  once daily 30 tablet 11   No current facility-administered medications for this visit.    Allergies: Aspirin  and Peanut  Social and Family History  Social History  reports that he quit smoking about 29 years ago. His smoking use included cigarettes. He has never used smokeless tobacco. He reports that he does not drink alcohol and does not use drugs.  Family History Family History  Problem Relation Name Age of Onset  . Colon cancer Neg  Hx    . Colon polyps Neg Hx    . High blood pressure (Hypertension) Neg Hx    . Diabetes type II Neg Hx    . Stroke Neg Hx      Review of Systems   Review of Systems: The patient denies chest pain, shortness of breath, orthopnea, paroxysmal nocturnal dyspnea, pedal edema, palpitations, heart racing, presyncope, syncope, easy fatigability, with sweats Review of 8 Systems is negative except as described above.  Physical Examination   Vitals:BP (!) 142/80   Pulse 78   Ht 165.1 cm (5' 5)   Wt 80.3 kg (177 lb)   SpO2 97%   BMI 29.45 kg/m  Ht:165.1 cm (5' 5) Wt:80.3 kg (177 lb) ADJ:Anib surface area is 1.92 meters squared. Body mass index is 29.45 kg/m.  General: Alert and oriented. Well-appearing. No acute distress. HEENT: Pupils equally reactive to light and accomodation    Neck: no JVD Lungs: Normal effort of breathing; clear to auscultation bilaterally; no wheezes, rales, rhonchi Heart: Regular rate and rhythm. No murmur, rub, or gallop Abdomen: nondistended Extremities: no cyanosis, clubbing, or edema Peripheral Pulses: 2+ radial  Skin: Warm, dry, no diaphoresis   Assessment   89 y.o. male with  1. Primary hypertension   2. Coronary artery disease involving autologous vein coronary bypass graft without angina pectoris   3. S/P coronary artery stent placement   4. Mixed hyperlipidemia     89 year old with history of coronary artery disease status post CABG x 3 around 1995, status post DES to SVG to PDA in 2017, hypertension, and hyperlipidemia. He was previously followed by Dr. Hester. He denies exertional chest pain, shortness of breath, peripheral edema, palpitations, or syncope. LDL cholesterol is well controlled on high intensity statin. He remains on Plavix . Most recent ETT Myoview showed no evidence of scar or ischemia. Blood pressure is mildly elevated today, with elevated home recordings as well.   Plan   Increase valsartan-HCTZ to 160-25 mg daily Recommend  heart healthy diet Continue statin and Plavix  for secondary prevention Recommend regular physical activity as tolerated Advised patient to continue home blood pressure monitoring Return to clinic for follow up in 6 months  No orders of the defined types were placed in this encounter.   Return in about 6 months (around 03/27/2024).  Attestation Statement:   I personally performed the service, non-incident to. (WP)   ANNA MARIA DRANE, PA-C

## 2023-09-25 NOTE — Telephone Encounter (Signed)
 Called pt using interpreter services, home number is invalid. Mobile number was answered but call was disconnect. No answer with reattempts. We go not have a coupon for Gemtesa  nor should patient be taking that medication. Per office note he should be taking Trospium .

## 2023-10-02 DIAGNOSIS — Z961 Presence of intraocular lens: Secondary | ICD-10-CM | POA: Diagnosis not present

## 2023-10-02 DIAGNOSIS — H353130 Nonexudative age-related macular degeneration, bilateral, stage unspecified: Secondary | ICD-10-CM | POA: Diagnosis not present

## 2023-10-02 DIAGNOSIS — H353132 Nonexudative age-related macular degeneration, bilateral, intermediate dry stage: Secondary | ICD-10-CM | POA: Diagnosis not present

## 2023-10-02 DIAGNOSIS — H04123 Dry eye syndrome of bilateral lacrimal glands: Secondary | ICD-10-CM | POA: Diagnosis not present

## 2023-10-19 ENCOUNTER — Other Ambulatory Visit: Payer: Self-pay | Admitting: Urology

## 2023-11-22 ENCOUNTER — Ambulatory Visit (INDEPENDENT_AMBULATORY_CARE_PROVIDER_SITE_OTHER): Admitting: Urology

## 2023-11-22 VITALS — BP 147/60 | HR 77

## 2023-11-22 DIAGNOSIS — N401 Enlarged prostate with lower urinary tract symptoms: Secondary | ICD-10-CM

## 2023-11-22 DIAGNOSIS — N138 Other obstructive and reflux uropathy: Secondary | ICD-10-CM | POA: Diagnosis not present

## 2023-11-22 DIAGNOSIS — N3281 Overactive bladder: Secondary | ICD-10-CM | POA: Diagnosis not present

## 2023-11-22 DIAGNOSIS — R399 Unspecified symptoms and signs involving the genitourinary system: Secondary | ICD-10-CM

## 2023-11-22 LAB — BLADDER SCAN AMB NON-IMAGING

## 2023-11-22 MED ORDER — GEMTESA 75 MG PO TABS: 75.0000 mg | ORAL_TABLET | Freq: Every day | ORAL | 11 refills | Status: DC

## 2023-11-22 NOTE — Progress Notes (Signed)
   11/22/2023 3:32 PM   Timothy Bass 01-12-1933 [id number]  Reason for visit: Follow up BPH, OAB  HPI: Today's visit conducted with translator.  89 year old male who underwent HOLEP in May 2021 for a long history of obstructive symptoms, UTIs, and elevated PVRs >266ml.  He had been doing well before developing some dysuria and urinary frequency and was actually found to have a 1.5 cm bladder stone in August 2022 and underwent a cystolitholapaxy in October 2022.  Prostatic fossa was wide open at that time.  After removal of bladder stone, he had had resolution of his dysuria, but had some residual bothersome urgency, frequency, and nocturia.  He did not do well on Myrbetriq secondary to hypertension, but was doing very well on Gemtesa .  Unfortunately Gemtesa  no longer covered by his insurance.  He also had an episode of mildly elevated PVR and microscopic hematuria and one of our PAs ordered a CT urogram and cystoscopy.  CT was benign, cystoscopy February 2024 was benign.  At our visit in April 2025 he was trialed on trospium  to replace Gemtesa  which was not affordable.  He denies any improvement on the trospium  and has had bothersome constipation.  He stopped that medication.  He previously was also trialed on Flomax  by our PA without any significant change in his symptoms.  His primary bother is urinary frequency every 2 hours during the day, nocturia 1-2 times at night.  He denies any urgency or incontinence.  PVR today is normal at .  He drinks at least 3 cups of coffee a day.  We discussed avoiding bladder irritants.  I think we need to have realistic expectations.  Samples of Gemtesa  provided and coupon.  We have previously tried to get approval from his insurance company as he failed anticholinergics and Myrbetriq, but they are only willing to cover approximately 20% of the cost.  -Gemtesa  sent, coupon provided, unfortunately this is the only medication that seems to work for his overactive  symptoms, and we have attempted to get coverage through his insurance without success -Recommend avoiding bladder irritants, need to have realistic expectations with his age and other medical issues  Redell JAYSON Burnet, MD  [facility] [address] [place], [place] [address] [phone]

## 2024-02-07 DIAGNOSIS — K5909 Other constipation: Secondary | ICD-10-CM | POA: Diagnosis not present

## 2024-02-08 DIAGNOSIS — H1131 Conjunctival hemorrhage, right eye: Secondary | ICD-10-CM | POA: Diagnosis not present

## 2024-02-13 DIAGNOSIS — H1131 Conjunctival hemorrhage, right eye: Secondary | ICD-10-CM | POA: Diagnosis not present

## 2024-02-29 ENCOUNTER — Other Ambulatory Visit: Payer: Self-pay

## 2024-02-29 ENCOUNTER — Emergency Department

## 2024-02-29 ENCOUNTER — Emergency Department
Admission: EM | Admit: 2024-02-29 | Discharge: 2024-02-29 | Disposition: A | Discharge status: Home / Self Care | Source: Ambulatory Visit | Attending: Emergency Medicine | Admitting: Emergency Medicine

## 2024-02-29 DIAGNOSIS — K5900 Constipation, unspecified: Secondary | ICD-10-CM | POA: Diagnosis not present

## 2024-02-29 DIAGNOSIS — K921 Melena: Secondary | ICD-10-CM | POA: Diagnosis not present

## 2024-02-29 DIAGNOSIS — E871 Hypo-osmolality and hyponatremia: Secondary | ICD-10-CM | POA: Insufficient documentation

## 2024-02-29 DIAGNOSIS — I10 Essential (primary) hypertension: Secondary | ICD-10-CM | POA: Diagnosis not present

## 2024-02-29 DIAGNOSIS — E878 Other disorders of electrolyte and fluid balance, not elsewhere classified: Secondary | ICD-10-CM | POA: Insufficient documentation

## 2024-02-29 LAB — COMPREHENSIVE METABOLIC PANEL WITH GFR
ALT: 31 U/L (ref 0–44)
AST: 34 U/L (ref 15–41)
Albumin: 4.8 g/dL (ref 3.5–5.0)
Alkaline Phosphatase: 78 U/L (ref 38–126)
Anion gap: 12 (ref 5–15)
BUN: 14 mg/dL (ref 8–23)
CO2: 25 mmol/L (ref 22–32)
Calcium: 9.3 mg/dL (ref 8.9–10.3)
Chloride: 92 mmol/L — ABNORMAL LOW (ref 98–111)
Creatinine, Ser: 0.69 mg/dL (ref 0.61–1.24)
GFR, Estimated: >60 mL/min (ref >60)
Glucose, Bld: 113 mg/dL — ABNORMAL HIGH (ref 70–99)
Potassium: 4 mmol/L (ref 3.5–5.1)
Sodium: 129 mmol/L — ABNORMAL LOW (ref 135–145)
Total Bilirubin: 1.2 mg/dL (ref 0.0–1.2)
Total Protein: 7.6 g/dL (ref 6.5–8.1)

## 2024-02-29 LAB — CBC
HCT: 45.2 % (ref 39.0–52.0)
Hemoglobin: 16.2 g/dL (ref 13.0–17.0)
MCH: 33.1 pg (ref 26.0–34.0)
MCHC: 35.8 g/dL (ref 30.0–36.0)
MCV: 92.2 fL (ref 80.0–100.0)
Platelets: 172 K/uL (ref 150–400)
RBC: 4.9 MIL/uL (ref 4.22–5.81)
RDW: 11.9 % (ref 11.5–15.5)
WBC: 5.9 K/uL (ref 4.0–10.5)
nRBC: 0 % (ref 0.0–0.2)

## 2024-02-29 LAB — LIPASE, BLOOD: Lipase: 35 U/L (ref 11–51)

## 2024-02-29 MED ORDER — GLYCERIN (LAXATIVE) 2 G RE SUPP
1.0000 | Freq: Once | RECTAL | Status: AC
  Administered 2024-02-29: 1 via RECTAL
  Filled 2024-02-29: qty 1

## 2024-02-29 MED ORDER — SODIUM CHLORIDE 0.9 % IV BOLUS
1000.0000 mL | Freq: Once | INTRAVENOUS | Status: AC
  Administered 2024-02-29: 1000 mL via INTRAVENOUS

## 2024-02-29 MED ORDER — MAGNESIUM CITRATE PO SOLN: 1.0000 | Freq: Once | ORAL | 0 refills | Status: AC

## 2024-02-29 NOTE — ED Provider Notes (Signed)
 [facility] Provider Note    Event Date/Time   First MD Initiated Contact with Patient 02/29/24 1704     (approximate)   History   Constipation   HPI  Timothy Bass is a 89 y.o. male with PMH of hypertension, hyperlipidemia, chronic constipation presents for evaluation of constipation.  Patient states it has been 6 days since his last bowel movement.  He reports that he has tried taking his constipation medications without relief.  He denies abdominal pain but states he feels bloated and gassy.  No nausea, vomiting or fevers.      Physical Exam   Triage Vital Signs: ED Triage Vitals [02/29/24 1613]  Encounter Vitals Group     BP (!) 186/99     Girls Systolic BP Percentile      Girls Diastolic BP Percentile      Boys Systolic BP Percentile      Boys Diastolic BP Percentile      Pulse Rate 75     Resp 18     Temp 98.2 F (36.8 C)     Temp Source Oral     SpO2 96 %     Weight      Height      Head Circumference      Peak Flow      Pain Score 3     Pain Loc      Pain Education      Exclude from Growth Chart     Most recent vital signs: Vitals:   02/29/24 1613 02/29/24 1715  BP: (!) 186/99   Pulse: 75   Resp: 18   Temp: 98.2 F (36.8 C)   SpO2: 96% 100%   General: Awake, no distress.  CV:  Good peripheral perfusion. RRR. Resp:  Normal effort. CTAB. Abd:  No distention. Soft, mild TTP in LLQ. Other:  Stool felt on DRE.   ED Results / Procedures / Treatments   Labs (all labs ordered are listed, but only abnormal results are displayed) Labs Reviewed  COMPREHENSIVE METABOLIC PANEL WITH GFR - Abnormal; Notable for the following components:      Result Value   Sodium 129 (*)    Chloride 92 (*)    Glucose, Bld 113 (*)    All other components within normal limits  CBC  LIPASE, BLOOD    RADIOLOGY  Abdominal x-ray obtained, interpreted the images as well as reviewed the radiologist report which shows a nonobstructive bowel gas  pattern with moderate stool burden.   PROCEDURES:  Critical Care performed: No  Procedures   MEDICATIONS ORDERED IN ED: Medications  sodium chloride  0.9 % bolus 1,000 mL (has no administration in time range)  Glycerin (Adult) 2 g suppository 1 suppository (1 suppository Rectal Given 02/29/24 1756)     IMPRESSION / MDM / ASSESSMENT AND PLAN / ED COURSE  I reviewed the triage vital signs and the nursing notes.                             89 year old male presents for evaluation of constipation.  Blood pressure is elevated but patient does have history of hypertension.  Denies symptoms related to this.  Reports he has been taking his medications as prescribed.  Vital signs stable otherwise.  Patient NAD on exam.  Differential diagnosis includes, but is not limited to, constipation, fecal impaction, less likely bowel obstruction, diverticulitis.  Patient's presentation is most consistent  with acute complicated illness / injury requiring diagnostic workup.  CBC is unremarkable.  CMP shows mild hyponatremia and mild hypochloremia otherwise normal.  Lipase unremarkable.  Abdominal x-ray shows nonobstructive bowel gas pattern.  Patient has some stool on DRE, Patient did not want an enema here but did want to try a suppository and then have a BM at home. Will also send some magnesium citrate for him to drink at home. Did discuss how to take his home medications.   Will give patient IV fluids to correct the hyponatremia and hypochloremia then plan for discharge home.   A video [place] interpreter was used throughout the duration of the visit.      FINAL CLINICAL IMPRESSION(S) / ED DIAGNOSES   Final diagnoses:  Constipation, unspecified constipation type     Rx / DC Orders   ED Discharge Orders          Ordered    magnesium citrate SOLN   Once        02/29/24 1741             Note:  This document was prepared using Dragon voice recognition software and may include  unintentional dictation errors.   Cleaster Tinnie LABOR, PA-C 02/29/24 1759    Viviann Pastor, MD 02/29/24 Timothy Bass

## 2024-02-29 NOTE — Discharge Instructions (Addendum)
 Your blood work showed your sodium and chloride was low, this was corrected by giving you IV fluids today. The rest of the blood work was normal. The abdominal xray showed you have stool in your colon consistent with constipation.  You were treated in the ED for constipation today. You were give a suppository, this helps pull fluid into the colon to help you have a bowel movement.   I have sent some medication for constipation to the pharmacy. Please drink all of the magnesium citrate, you can take this tomorrow. I also encourage you to take all of your previously prescribed constipation medications every day.   Follow up with your primary care provider for a blood pressure recheck. If you develop chest pain, shortness of breath, headache or blurry vision please return to the ED.   Return to the ED with any worsening symptoms like severe abdominal pain, vomiting or development of a fever.

## 2024-02-29 NOTE — ED Notes (Signed)
 Family at bedside patient and family denies use for interpreter. Both verbalized understanding with positive teach back

## 2024-02-29 NOTE — ED Triage Notes (Signed)
 Pt to ED via POV from [facility]. Pt reports 5-6 years has been having chronic constipation. Pt reports normally resolves with OTC medicine but reports has been 6 days without BM. Pt reports intermittent abd pain and feeling gassy and bloated.   [place] interpreter used in triage. Pt is HOH.

## 2024-05-20 ENCOUNTER — Inpatient Hospital Stay
Admission: EM | Admit: 2024-05-20 | Discharge: 2024-05-25 | DRG: 871 | Disposition: A | Discharge status: Home Health | Attending: Student in an Organized Health Care Education/Training Program | Admitting: Student in an Organized Health Care Education/Training Program

## 2024-05-20 ENCOUNTER — Emergency Department

## 2024-05-20 ENCOUNTER — Other Ambulatory Visit: Payer: Self-pay

## 2024-05-20 DIAGNOSIS — Z87891 Personal history of nicotine dependence: Secondary | ICD-10-CM

## 2024-05-20 DIAGNOSIS — N401 Enlarged prostate with lower urinary tract symptoms: Secondary | ICD-10-CM | POA: Diagnosis present

## 2024-05-20 DIAGNOSIS — Z951 Presence of aortocoronary bypass graft: Secondary | ICD-10-CM

## 2024-05-20 DIAGNOSIS — Z8616 Personal history of COVID-19: Secondary | ICD-10-CM

## 2024-05-20 DIAGNOSIS — Z955 Presence of coronary angioplasty implant and graft: Secondary | ICD-10-CM

## 2024-05-20 DIAGNOSIS — Z1152 Encounter for screening for COVID-19: Secondary | ICD-10-CM

## 2024-05-20 DIAGNOSIS — K219 Gastro-esophageal reflux disease without esophagitis: Secondary | ICD-10-CM | POA: Diagnosis present

## 2024-05-20 DIAGNOSIS — N400 Enlarged prostate without lower urinary tract symptoms: Secondary | ICD-10-CM | POA: Diagnosis present

## 2024-05-20 DIAGNOSIS — I10 Essential (primary) hypertension: Secondary | ICD-10-CM | POA: Diagnosis present

## 2024-05-20 DIAGNOSIS — I739 Peripheral vascular disease, unspecified: Secondary | ICD-10-CM | POA: Diagnosis present

## 2024-05-20 DIAGNOSIS — E871 Hypo-osmolality and hyponatremia: Secondary | ICD-10-CM | POA: Diagnosis present

## 2024-05-20 DIAGNOSIS — I2489 Other forms of acute ischemic heart disease: Secondary | ICD-10-CM | POA: Diagnosis present

## 2024-05-20 DIAGNOSIS — Z79899 Other long term (current) drug therapy: Secondary | ICD-10-CM

## 2024-05-20 DIAGNOSIS — Z886 Allergy status to analgesic agent status: Secondary | ICD-10-CM

## 2024-05-20 DIAGNOSIS — E878 Other disorders of electrolyte and fluid balance, not elsewhere classified: Secondary | ICD-10-CM | POA: Diagnosis present

## 2024-05-20 DIAGNOSIS — Z91010 Allergy to peanuts: Secondary | ICD-10-CM

## 2024-05-20 DIAGNOSIS — E7800 Pure hypercholesterolemia, unspecified: Secondary | ICD-10-CM | POA: Diagnosis present

## 2024-05-20 DIAGNOSIS — J690 Pneumonitis due to inhalation of food and vomit: Secondary | ICD-10-CM | POA: Diagnosis present

## 2024-05-20 DIAGNOSIS — I252 Old myocardial infarction: Secondary | ICD-10-CM

## 2024-05-20 DIAGNOSIS — R0902 Hypoxemia: Secondary | ICD-10-CM | POA: Diagnosis present

## 2024-05-20 DIAGNOSIS — Z7902 Long term (current) use of antithrombotics/antiplatelets: Secondary | ICD-10-CM

## 2024-05-20 DIAGNOSIS — K5900 Constipation, unspecified: Secondary | ICD-10-CM | POA: Diagnosis present

## 2024-05-20 DIAGNOSIS — R0602 Shortness of breath: Principal | ICD-10-CM

## 2024-05-20 DIAGNOSIS — I4510 Unspecified right bundle-branch block: Secondary | ICD-10-CM | POA: Diagnosis present

## 2024-05-20 DIAGNOSIS — A419 Sepsis, unspecified organism: Principal | ICD-10-CM | POA: Diagnosis present

## 2024-05-20 DIAGNOSIS — J188 Other pneumonia, unspecified organism: Secondary | ICD-10-CM

## 2024-05-20 DIAGNOSIS — R338 Other retention of urine: Secondary | ICD-10-CM | POA: Diagnosis present

## 2024-05-20 DIAGNOSIS — I2510 Atherosclerotic heart disease of native coronary artery without angina pectoris: Secondary | ICD-10-CM | POA: Diagnosis present

## 2024-05-20 DIAGNOSIS — E876 Hypokalemia: Secondary | ICD-10-CM | POA: Diagnosis present

## 2024-05-20 DIAGNOSIS — E785 Hyperlipidemia, unspecified: Secondary | ICD-10-CM | POA: Insufficient documentation

## 2024-05-20 LAB — TROPONIN T, HIGH SENSITIVITY
Troponin T High Sensitivity: <15 ng/L (ref 0–19)
Troponin T High Sensitivity: 44 ng/L — ABNORMAL HIGH (ref 0–19)

## 2024-05-20 LAB — BASIC METABOLIC PANEL WITH GFR
Anion gap: 15 (ref 5–15)
BUN: 16 mg/dL (ref 8–23)
CO2: 27 mmol/L (ref 22–32)
Calcium: 9.1 mg/dL (ref 8.9–10.3)
Chloride: 92 mmol/L — ABNORMAL LOW (ref 98–111)
Creatinine, Ser: 0.71 mg/dL (ref 0.61–1.24)
GFR, Estimated: >60 mL/min
Glucose, Bld: 135 mg/dL — ABNORMAL HIGH (ref 70–99)
Potassium: 3.7 mmol/L (ref 3.5–5.1)
Sodium: 134 mmol/L — ABNORMAL LOW (ref 135–145)

## 2024-05-20 LAB — CBC
HCT: 50.3 % (ref 39.0–52.0)
Hemoglobin: 17.1 g/dL — ABNORMAL HIGH (ref 13.0–17.0)
MCH: 32.6 pg (ref 26.0–34.0)
MCHC: 34 g/dL (ref 30.0–36.0)
MCV: 96 fL (ref 80.0–100.0)
Platelets: 189 K/uL (ref 150–400)
RBC: 5.24 MIL/uL (ref 4.22–5.81)
RDW: 11.9 % (ref 11.5–15.5)
WBC: 8.6 K/uL (ref 4.0–10.5)
nRBC: 0 % (ref 0.0–0.2)

## 2024-05-20 LAB — RESP PANEL BY RT-PCR (RSV, FLU A&B, COVID)  RVPGX2
Influenza A by PCR: NEGATIVE
Influenza B by PCR: NEGATIVE
Resp Syncytial Virus by PCR: NEGATIVE
SARS Coronavirus 2 by RT PCR: NEGATIVE

## 2024-05-20 LAB — PRO BRAIN NATRIURETIC PEPTIDE: Pro Brain Natriuretic Peptide: 336 pg/mL — ABNORMAL HIGH

## 2024-05-20 MED ORDER — NITROGLYCERIN 2 % TD OINT
1.0000 [in_us] | TOPICAL_OINTMENT | Freq: Once | TRANSDERMAL | Status: AC
  Administered 2024-05-20: 1 [in_us] via TOPICAL
  Filled 2024-05-20: qty 1

## 2024-05-20 NOTE — ED Notes (Signed)
 Fall bundle in place with bed alarm activated.

## 2024-05-20 NOTE — ED Triage Notes (Signed)
 Pt reports cough congestion shortness of breath since earlier today. Pt appears to be tachypnic in triage. Pt also reports some chest discomfort.

## 2024-05-20 NOTE — ED Provider Notes (Addendum)
 "  [facility] Provider Note    Event Date/Time   First MD Initiated Contact with Patient 05/20/24 2048     (approximate)   History   Shortness of Breath   HPI  Timothy Bass is a 89 y.o. male who presents to the ED for evaluation of Shortness of Breath   Review of cardiology clinic visit from 12/8.  Remote CABG x 3, HTN, HLD  Patient presents to the ED with his son for evaluation of shortness of breath that arose just in the past 3 hours.  Was feeling okay prior to this.  Cough is present.  Denies chest pain or pressure reports primary concern for shortness of breath.   Physical Exam   Triage Vital Signs: ED Triage Vitals  Encounter Vitals Group     BP 05/20/24 2039 (!) 194/97     Girls Systolic BP Percentile --      Girls Diastolic BP Percentile --      Boys Systolic BP Percentile --      Boys Diastolic BP Percentile --      Pulse Rate 05/20/24 2039 (!) 114     Resp 05/20/24 2039 (!) 25     Temp 05/20/24 2039 (!) 97.5 F (36.4 C)     Temp src --      SpO2 05/20/24 2039 93 %     Weight 05/20/24 2037 172 lb (78 kg)     Height 05/20/24 2037 5' 3 (1.6 m)     Head Circumference --      Peak Flow --      Pain Score 05/20/24 2037 6     Pain Loc --      Pain Education --      Exclude from Growth Chart --     Most recent vital signs: Vitals:   05/20/24 2230 05/20/24 2300  BP: (!) 180/86 (!) 144/73  Pulse: (!) 105 100  Resp: (!) 25 (!) 25  Temp:    SpO2: 91% 92%    General: Awake, no distress.  Sitting upright in bed, bracing on the stretcher, clearly tachypneic and dyspneic. CV:  Good peripheral perfusion.  Resp:  Tachypneic to the mid/upper 20s.  Bibasilar crackles are present, no wheezing Abd:  No distention.  MSK:  No deformity noted.  Pitting edema to bilateral lower extremities Neuro:  No focal deficits appreciated. Other:     ED Results / Procedures / Treatments   Labs (all labs ordered are listed, but only abnormal results are  displayed) Labs Reviewed  BASIC METABOLIC PANEL WITH GFR - Abnormal; Notable for the following components:      Result Value   Sodium 134 (*)    Chloride 92 (*)    Glucose, Bld 135 (*)    All other components within normal limits  CBC - Abnormal; Notable for the following components:   Hemoglobin 17.1 (*)    All other components within normal limits  PRO BRAIN NATRIURETIC PEPTIDE - Abnormal; Notable for the following components:   Pro Brain Natriuretic Peptide 336.0 (*)    All other components within normal limits  TROPONIN T, HIGH SENSITIVITY - Abnormal; Notable for the following components:   Troponin T High Sensitivity 44 (*)    All other components within normal limits  RESP PANEL BY RT-PCR (RSV, FLU A&B, COVID)  [id number]  TROPONIN T, HIGH SENSITIVITY    EKG For quality EKG seems to demonstrate a sinus rhythm with rate of 114, leftward  axis, right bundle, no STEMI  RADIOLOGY 1 view CXR interpreted by me with pulmonary vascular congestion  Official radiology report(s): DG Chest Port 1 View Result Date: 05/20/2024 EXAM: 1 VIEW(S) XRAY OF THE CHEST 05/20/2024 08:54:00 PM COMPARISON: None available. CLINICAL HISTORY: Chest pain FINDINGS: LUNGS AND PLEURA: Retrocardiac consolidation, consistent with recent changes of acute lobar pneumonia or aspiration. Multiple small pulmonary nodules are seen within the right apex, likely corresponding to the pulmonary nodule seen within this region on prior chest CT of 02/17/2017. No pleural effusion. No pneumothorax. HEART AND MEDIASTINUM: Coronary artery bypass grafting has been performed. Atherosclerotic plaque present. No acute abnormality of the cardiac and mediastinal silhouettes. BONES AND SOFT TISSUES: Intact sternotomy wires. No acute osseous abnormality. IMPRESSION: 1. Retrocardiac consolidation, which may reflect acute lobar pneumonia or aspiration. 2. Multiple small right apical pulmonary nodules, likely corresponding to the nodules noted on  prior chest CT dated Feb 17, 2017. Electronically signed by: Dorethia Molt MD 05/20/2024 10:38 PM EST RP Workstation: [id number]    PROCEDURES and INTERVENTIONS:  .1-3 Lead EKG Interpretation  Performed by: Claudene Rover, MD Authorized by: Claudene Rover, MD     Interpretation: abnormal     ECG rate:  109   ECG rate assessment: tachycardic     Rhythm: sinus tachycardia     Ectopy: none     Conduction: normal   .Critical Care  Performed by: Claudene Rover, MD Authorized by: Claudene Rover, MD   Critical care provider statement:    Critical care time (minutes):  30   Critical care time was exclusive of:  Separately billable procedures and treating other patients   Critical care was necessary to treat or prevent imminent or life-threatening deterioration of the following conditions:  Respiratory failure   Critical care was time spent personally by me on the following activities:  Development of treatment plan with patient or surrogate, discussions with consultants, evaluation of patient's response to treatment, examination of patient, ordering and review of laboratory studies, ordering and review of radiographic studies, ordering and performing treatments and interventions, pulse oximetry, re-evaluation of patient's condition and review of old charts   Medications  nitroGLYCERIN  (NITROGLYN) 2 % ointment 1 inch (1 inch Topical Given 05/20/24 2102)     IMPRESSION / MDM / ASSESSMENT AND PLAN / ED COURSE  I reviewed the triage vital signs and the nursing notes.  Differential diagnosis includes, but is not limited to, ACS, PTX, PNA, muscle strain/spasm, PE, dissection, anxiety, pleural effusion  {Patient presents with symptoms of an acute illness or injury that is potentially life-threatening.  Patient presents with sudden onset shortness of breath.  Hypertensive, pitting edema and appears volume overloaded on exam.  Initial suspicion for rapid onset pulmonary edema.  Improving symptoms with  Nitropaste.  Marginal elevation of troponin and BNP, normal CBC and metabolic panel.  CXR questions infiltrate but pneumonia less likely considering progression of symptoms and chronicity.  Consult medicine for admission.  Add on CTA chest to further elucidate source of his shortness of breath, rule out PE.  Clinical Course as of 05/20/24 2354  Tue May 20, 2024  2119 Reassessed, he reports feeling better after the Nitropaste.  Still tachypneic but no longer bracing on the stretcher [DS]  2150 Patient disconnected himself from monitor equipment and sitting on bedside chair.  Reports he is feeling better and asking if he can go home.  I urged him to stay and we discussed volume overload and transient improvement only because of the nitroglycerin   paste.  Awaiting serum test results and x-ray radiology read.  I urged him to stay and he is reluctantly willing.  We discussed he would likely need to be admitted [DS]  2330 I consult with medicine who agrees to admit, Dr. Lawence. We discuss pending CTA chest [DS]    Clinical Course User Index [DS] Claudene Rover, MD     FINAL CLINICAL IMPRESSION(S) / ED DIAGNOSES   Final diagnoses:  Shortness of breath  Hypoxia     Rx / DC Orders   ED Discharge Orders     None        Note:  This document was prepared using Dragon voice recognition software and may include unintentional dictation errors.   Claudene Rover, MD 05/20/24 [id number]    Claudene Rover, MD 05/20/24 [phone]  "

## 2024-05-21 ENCOUNTER — Emergency Department

## 2024-05-21 DIAGNOSIS — E7800 Pure hypercholesterolemia, unspecified: Secondary | ICD-10-CM | POA: Diagnosis present

## 2024-05-21 DIAGNOSIS — E876 Hypokalemia: Secondary | ICD-10-CM | POA: Diagnosis present

## 2024-05-21 DIAGNOSIS — J189 Pneumonia, unspecified organism: Secondary | ICD-10-CM

## 2024-05-21 DIAGNOSIS — N401 Enlarged prostate with lower urinary tract symptoms: Secondary | ICD-10-CM | POA: Diagnosis present

## 2024-05-21 DIAGNOSIS — E878 Other disorders of electrolyte and fluid balance, not elsewhere classified: Secondary | ICD-10-CM | POA: Diagnosis present

## 2024-05-21 DIAGNOSIS — J188 Other pneumonia, unspecified organism: Secondary | ICD-10-CM | POA: Diagnosis not present

## 2024-05-21 DIAGNOSIS — K5900 Constipation, unspecified: Secondary | ICD-10-CM | POA: Diagnosis present

## 2024-05-21 DIAGNOSIS — I739 Peripheral vascular disease, unspecified: Secondary | ICD-10-CM | POA: Diagnosis present

## 2024-05-21 DIAGNOSIS — J690 Pneumonitis due to inhalation of food and vomit: Secondary | ICD-10-CM | POA: Diagnosis present

## 2024-05-21 DIAGNOSIS — Z8616 Personal history of COVID-19: Secondary | ICD-10-CM | POA: Diagnosis not present

## 2024-05-21 DIAGNOSIS — E785 Hyperlipidemia, unspecified: Secondary | ICD-10-CM | POA: Diagnosis not present

## 2024-05-21 DIAGNOSIS — Z955 Presence of coronary angioplasty implant and graft: Secondary | ICD-10-CM | POA: Diagnosis not present

## 2024-05-21 DIAGNOSIS — Z87891 Personal history of nicotine dependence: Secondary | ICD-10-CM | POA: Diagnosis not present

## 2024-05-21 DIAGNOSIS — I10 Essential (primary) hypertension: Secondary | ICD-10-CM | POA: Diagnosis present

## 2024-05-21 DIAGNOSIS — R0602 Shortness of breath: Secondary | ICD-10-CM | POA: Diagnosis present

## 2024-05-21 DIAGNOSIS — I252 Old myocardial infarction: Secondary | ICD-10-CM | POA: Diagnosis not present

## 2024-05-21 DIAGNOSIS — I2510 Atherosclerotic heart disease of native coronary artery without angina pectoris: Secondary | ICD-10-CM | POA: Diagnosis present

## 2024-05-21 DIAGNOSIS — I4510 Unspecified right bundle-branch block: Secondary | ICD-10-CM | POA: Diagnosis present

## 2024-05-21 DIAGNOSIS — A419 Sepsis, unspecified organism: Secondary | ICD-10-CM

## 2024-05-21 DIAGNOSIS — E871 Hypo-osmolality and hyponatremia: Secondary | ICD-10-CM | POA: Diagnosis present

## 2024-05-21 DIAGNOSIS — I2489 Other forms of acute ischemic heart disease: Secondary | ICD-10-CM | POA: Diagnosis present

## 2024-05-21 DIAGNOSIS — Z1152 Encounter for screening for COVID-19: Secondary | ICD-10-CM | POA: Diagnosis not present

## 2024-05-21 DIAGNOSIS — R338 Other retention of urine: Secondary | ICD-10-CM | POA: Diagnosis present

## 2024-05-21 DIAGNOSIS — K219 Gastro-esophageal reflux disease without esophagitis: Secondary | ICD-10-CM | POA: Diagnosis present

## 2024-05-21 DIAGNOSIS — Z79899 Other long term (current) drug therapy: Secondary | ICD-10-CM | POA: Diagnosis not present

## 2024-05-21 DIAGNOSIS — R0902 Hypoxemia: Secondary | ICD-10-CM | POA: Diagnosis present

## 2024-05-21 DIAGNOSIS — Z7902 Long term (current) use of antithrombotics/antiplatelets: Secondary | ICD-10-CM | POA: Diagnosis not present

## 2024-05-21 DIAGNOSIS — Z951 Presence of aortocoronary bypass graft: Secondary | ICD-10-CM | POA: Diagnosis not present

## 2024-05-21 LAB — BASIC METABOLIC PANEL WITH GFR
Anion gap: 14 (ref 5–15)
BUN: 19 mg/dL (ref 8–23)
CO2: 23 mmol/L (ref 22–32)
Calcium: 9 mg/dL (ref 8.9–10.3)
Chloride: 94 mmol/L — ABNORMAL LOW (ref 98–111)
Creatinine, Ser: 0.68 mg/dL (ref 0.61–1.24)
GFR, Estimated: >60 mL/min
Glucose, Bld: 133 mg/dL — ABNORMAL HIGH (ref 70–99)
Potassium: 3.1 mmol/L — ABNORMAL LOW (ref 3.5–5.1)
Sodium: 132 mmol/L — ABNORMAL LOW (ref 135–145)

## 2024-05-21 LAB — TROPONIN T, HIGH SENSITIVITY
Troponin T High Sensitivity: 54 ng/L — ABNORMAL HIGH (ref 0–19)
Troponin T High Sensitivity: 42 ng/L — ABNORMAL HIGH (ref 0–19)

## 2024-05-21 LAB — CBC
HCT: 43.7 % (ref 39.0–52.0)
Hemoglobin: 15.6 g/dL (ref 13.0–17.0)
MCH: 32.9 pg (ref 26.0–34.0)
MCHC: 35.7 g/dL (ref 30.0–36.0)
MCV: 92.2 fL (ref 80.0–100.0)
Platelets: 154 K/uL (ref 150–400)
RBC: 4.74 MIL/uL (ref 4.22–5.81)
RDW: 12.1 % (ref 11.5–15.5)
WBC: 4.8 K/uL (ref 4.0–10.5)
nRBC: 0 % (ref 0.0–0.2)

## 2024-05-21 LAB — CORTISOL-AM, BLOOD: Cortisol - AM: 39.1 ug/dL — ABNORMAL HIGH (ref 6.7–22.6)

## 2024-05-21 LAB — PROTIME-INR
INR: 1 (ref 0.8–1.2)
Prothrombin Time: 14.1 s (ref 11.4–15.2)

## 2024-05-21 MED ORDER — IPRATROPIUM-ALBUTEROL 0.5-2.5 (3) MG/3ML IN SOLN
3.0000 mL | Freq: Four times a day (QID) | RESPIRATORY_TRACT | Status: DC
  Administered 2024-05-21 – 2024-05-22 (×4): 3 mL via RESPIRATORY_TRACT
  Filled 2024-05-21 (×5): qty 3

## 2024-05-21 MED ORDER — METOPROLOL SUCCINATE ER 50 MG PO TB24
50.0000 mg | ORAL_TABLET | Freq: Every day | ORAL | Status: DC
  Administered 2024-05-21 – 2024-05-25 (×5): 50 mg via ORAL
  Filled 2024-05-21 (×5): qty 1

## 2024-05-21 MED ORDER — MAGNESIUM HYDROXIDE 400 MG/5ML PO SUSP
30.0000 mL | Freq: Every day | ORAL | Status: DC | PRN
  Administered 2024-05-24: 30 mL via ORAL
  Filled 2024-05-21: qty 30

## 2024-05-21 MED ORDER — IOHEXOL 350 MG/ML SOLN
75.0000 mL | Freq: Once | INTRAVENOUS | Status: AC | PRN
  Administered 2024-05-21: 75 mL via INTRAVENOUS

## 2024-05-21 MED ORDER — IPRATROPIUM-ALBUTEROL 0.5-2.5 (3) MG/3ML IN SOLN
RESPIRATORY_TRACT | Status: AC
  Administered 2024-05-21: 3 mL via RESPIRATORY_TRACT
  Filled 2024-05-21: qty 3

## 2024-05-21 MED ORDER — SODIUM CHLORIDE 0.9 % IV SOLN
3.0000 g | Freq: Four times a day (QID) | INTRAVENOUS | Status: DC
  Administered 2024-05-21 – 2024-05-25 (×18): 3 g via INTRAVENOUS
  Filled 2024-05-21 (×20): qty 8

## 2024-05-21 MED ORDER — IPRATROPIUM-ALBUTEROL 0.5-2.5 (3) MG/3ML IN SOLN
3.0000 mL | Freq: Once | RESPIRATORY_TRACT | Status: AC
  Administered 2024-05-21: 3 mL via RESPIRATORY_TRACT

## 2024-05-21 MED ORDER — IRBESARTAN 150 MG PO TABS
150.0000 mg | ORAL_TABLET | Freq: Every day | ORAL | Status: DC
  Administered 2024-05-21 – 2024-05-23 (×3): 150 mg via ORAL
  Filled 2024-05-21 (×3): qty 1

## 2024-05-21 MED ORDER — HYDROCOD POLI-CHLORPHE POLI ER 10-8 MG/5ML PO SUER
5.0000 mL | Freq: Two times a day (BID) | ORAL | Status: DC | PRN
  Administered 2024-05-22 – 2024-05-23 (×2): 5 mL via ORAL
  Filled 2024-05-21 (×3): qty 5

## 2024-05-21 MED ORDER — LACTATED RINGERS IV SOLN
150.0000 mL/h | INTRAVENOUS | Status: AC
  Administered 2024-05-21: 150 mL/h via INTRAVENOUS

## 2024-05-21 MED ORDER — GUAIFENESIN ER 600 MG PO TB12
600.0000 mg | ORAL_TABLET | Freq: Two times a day (BID) | ORAL | Status: DC
  Administered 2024-05-21 – 2024-05-24 (×8): 600 mg via ORAL
  Filled 2024-05-21 (×8): qty 1

## 2024-05-21 MED ORDER — SODIUM CHLORIDE 0.9 % IV SOLN
500.0000 mg | INTRAVENOUS | Status: DC
  Administered 2024-05-21 – 2024-05-23 (×3): 500 mg via INTRAVENOUS
  Filled 2024-05-21 (×3): qty 5

## 2024-05-21 MED ORDER — HYDROCHLOROTHIAZIDE 25 MG PO TABS
25.0000 mg | ORAL_TABLET | Freq: Every day | ORAL | Status: DC
  Administered 2024-05-21 – 2024-05-23 (×3): 25 mg via ORAL
  Filled 2024-05-21 (×3): qty 1

## 2024-05-21 MED ORDER — ONDANSETRON HCL 4 MG PO TABS: 4.0000 mg | ORAL_TABLET | Freq: Four times a day (QID) | ORAL | Status: DC | PRN

## 2024-05-21 MED ORDER — ENOXAPARIN SODIUM 40 MG/0.4ML IJ SOSY
40.0000 mg | PREFILLED_SYRINGE | INTRAMUSCULAR | Status: DC
  Administered 2024-05-21 – 2024-05-25 (×5): 40 mg via SUBCUTANEOUS
  Filled 2024-05-21 (×5): qty 0.4

## 2024-05-21 MED ORDER — CLOPIDOGREL BISULFATE 75 MG PO TABS
75.0000 mg | ORAL_TABLET | Freq: Every day | ORAL | Status: DC
  Administered 2024-05-22 – 2024-05-25 (×4): 75 mg via ORAL
  Filled 2024-05-21 (×4): qty 1

## 2024-05-21 MED ORDER — ACETAMINOPHEN 650 MG RE SUPP: 650.0000 mg | Freq: Four times a day (QID) | RECTAL | Status: DC | PRN

## 2024-05-21 MED ORDER — NITROGLYCERIN 0.4 MG SL SUBL: 0.4000 mg | SUBLINGUAL_TABLET | SUBLINGUAL | Status: DC | PRN

## 2024-05-21 MED ORDER — MORPHINE SULFATE (PF) 2 MG/ML IV SOLN
2.0000 mg | INTRAVENOUS | Status: DC | PRN
  Administered 2024-05-22: 2 mg via INTRAVENOUS
  Filled 2024-05-21: qty 1

## 2024-05-21 MED ORDER — ATORVASTATIN CALCIUM 80 MG PO TABS
80.0000 mg | ORAL_TABLET | Freq: Every day | ORAL | Status: DC
  Administered 2024-05-21 – 2024-05-25 (×5): 80 mg via ORAL
  Filled 2024-05-21 (×3): qty 1
  Filled 2024-05-21: qty 4
  Filled 2024-05-21: qty 1

## 2024-05-21 MED ORDER — TRAZODONE HCL 50 MG PO TABS
25.0000 mg | ORAL_TABLET | Freq: Every evening | ORAL | Status: DC | PRN
  Administered 2024-05-24: 25 mg via ORAL
  Filled 2024-05-21: qty 1

## 2024-05-21 MED ORDER — VALSARTAN-HYDROCHLOROTHIAZIDE 160-25 MG PO TABS: 1.0000 | ORAL_TABLET | Freq: Every day | ORAL | Status: DC

## 2024-05-21 MED ORDER — ACETAMINOPHEN 325 MG PO TABS
650.0000 mg | ORAL_TABLET | Freq: Four times a day (QID) | ORAL | Status: DC | PRN
  Administered 2024-05-23: 650 mg via ORAL
  Filled 2024-05-21: qty 2

## 2024-05-21 MED ORDER — ONDANSETRON HCL 4 MG/2ML IJ SOLN: 4.0000 mg | Freq: Four times a day (QID) | INTRAMUSCULAR | Status: DC | PRN

## 2024-05-21 NOTE — ED Notes (Signed)
 Per Lenon MD, pt SpO2 goal is 92% and above and to start to titrate oxygen down. Pt was at 99% at 10L HFNC. This RN titrated oxygen down to 8L HFNC. Pt currently at 97% on 8L.

## 2024-05-21 NOTE — Assessment & Plan Note (Signed)
-   The patient will be admitted to the medical telemetry bed. - Will continue antibiotic therapy with IV Unasyn and Zithromax. - Will be hydrated with IV lactated ringer. - Mucolytic therapy and bronchodilator therapy will be provided.

## 2024-05-21 NOTE — Assessment & Plan Note (Signed)
-   Will continue antihypertensive therapy.

## 2024-05-21 NOTE — ED Notes (Signed)
 CCMD called to initiate cardiac monitoring.

## 2024-05-21 NOTE — ED Notes (Signed)
 Given report to [place], [place]

## 2024-05-21 NOTE — ED Notes (Addendum)
 Pt needed to move to edge of bed, reported needs to have a BM, pt's oxygen saturation dropped to 86% RA, placed pt back on 6L/HFNC. Dr Lenon made aware

## 2024-05-21 NOTE — ED Notes (Addendum)
 Pt administered duoneb, and SpO2 at 93%. Once finished with duoneb, pt Timothy Bass put at 6L, with pt SpO2 at 93%. Pt's SpO2 not consistently staying above 93%. Natalie, NP notified.

## 2024-05-21 NOTE — ED Notes (Addendum)
 NP suggests to call RT to have them assess in case the pt needs HFNC. RT called to assess pt.

## 2024-05-21 NOTE — ED Notes (Signed)
 This RN assisted pt to bed from recliner. Bed locked in the lowest position. Pt has no other requests at this time.

## 2024-05-21 NOTE — Assessment & Plan Note (Addendum)
-   Though his chest pain is likely atypical due to his pneumonia, will follow serial troponins especially given elevated troponin with delta. - Will continue statin therapy, Toprol-XL and Imdur  as well as Plavix ,.

## 2024-05-21 NOTE — ED Notes (Signed)
 Due to pt consistently staying at SpO2 of 97-98%, this RN has titrated O2 down to 6L HFNC. Pt currently at 97% SpO2.

## 2024-05-21 NOTE — Consult Note (Signed)
 " [facility] CARDIOLOGY CONSULT NOTE       Patient ID: Timothy Bass MRN: [id number] DOB/AGE: 21-Nov-1932 89 y.o.  Admit date: 05/20/2024 Referring Physician Dr. Madison Peaches Primary Physician Glover Lenis, MD  Primary Cardiologist Therisa Pierre, PA Reason for Consultation chest pain, elevated troponin  HPI: Timothy Bass is a 89 y.o. male  with a past medical history of coronary artery disease s/p CABG x3 with subsequent stent of SVG to PDA 12/2015, hypertension, hyperlipidemia, GERD, BPH who presented to the ED on 05/20/2024 for SOB with chest pain worse with cough. Cardiology was consulted for further evaluation.   Patient presented with SOB and CP associated with cough. Workup in the ED notable for creatinine 0.71, potassium 3.7, hemoglobin 17.1, WBC 8.6. Troponins <15 > 44 > 54, BNP 336. EKG in the ED sinus tach RBBB rate 114 bpm. CTA chest concerning for aspiration pneumonia.   At the time of my evaluation this morning, he is resting comfortably in hospital bed.  We discussed his symptoms in further detail.  He states that yesterday he had onset of severe cough with associated shortness of breath.  States his blood pressure was also significantly elevated on home checks.  His symptoms persisted for a few hours thus he decided to come in for evaluation.  He denies any chest pain at all.  I asked him multiple times and he continued to deny any episodes of chest discomfort.  Also denied any recent episodes of exertional symptoms.  Denies any dizziness or lightheadedness.  States that he has noticed he has had a runny nose every morning recently.  States he is overall feeling better today and he is currently on 10 L of O2.  Review of systems complete and found to be negative unless listed above    Past Medical History:  Diagnosis Date   Acquired trigger finger of both middle fingers    Aortic atherosclerosis    Arthritis    BPH without obstruction/lower urinary tract symptoms     Cholelithiasis    Chronic constipation    Coronary artery disease    a.) 3v CABG (LIMA-LAD, SVG-D1, SVG-dLCx) in 1995. b.) LHC 01/05/2016: EF 55%; 100% pLAD, 100% oD1, 100% dLAD, 30% p-mLCx, 65% dLCx-1, 100% dLCx-2, 95% distal graft lesion. Atretic LIMA-LAD; patent SVG-D1; sig disease SVG-PDA. 2.75 x 12 mm Resolute Integrity DES x 1 to dSVG-PDA graft.   DOE (dyspnea on exertion)    Dysuria    GERD (gastroesophageal reflux disease)    History of 2019 novel coronavirus disease (COVID-19) 05/28/2020   Hypercholesteremia    Hyperlipidemia    Hypertension    Long term current use of antithrombotics/antiplatelets    a.) Clopidogrel    Myocardial infarction (HCC) 1995   Nephrolithiasis    Nocturia    Prediabetes    Prostatic hypertrophy    PVD (peripheral vascular disease)    RBBB (right bundle branch block)    S/P CABG x 3 1995   a.) 3v CABG (LIMA-LAD, SVG-D1, SVG-dLCx)   Urinary frequency    Urinary incontinence     Past Surgical History:  Procedure Laterality Date   CARDIAC CATHETERIZATION N/A 01/05/2016   Procedure: LEFT HEART CATH AND CORS/GRAFTS ANGIOGRAPHY;  Surgeon: Wolm JINNY Rhyme, MD;  Location: [facility] INVASIVE CV LAB;  Service: Cardiovascular;  Laterality: N/A;   CARDIAC CATHETERIZATION N/A 01/05/2016   Procedure: Coronary Stent Intervention (2.75 x 12 mm Resolute Intregity DES x1 to dSVG-PDA graft);  Surgeon: Marsa Dooms, MD;  Location:  [facility] INVASIVE CV [facility];  Service: Cardiovascular;  Laterality: N/A;   CATARACT EXTRACTION W/PHACO Right 10/30/2022   Procedure: CATARACT EXTRACTION PHACO AND INTRAOCULAR LENS PLACEMENT (IOC) RIGHT  10.55  01:05.6;  Surgeon: Enola Feliciano Hugger, MD;  Location: [facility];  Service: Ophthalmology;  Laterality: Right;   CATARACT EXTRACTION W/PHACO Left 12/13/2022   Procedure: CATARACT EXTRACTION PHACO AND INTRAOCULAR LENS PLACEMENT (IOC) LEFT  5.19  00:46.9;  Surgeon: Enola Feliciano Hugger, MD;  Location: [facility];   Service: Ophthalmology;  Laterality: Left;   COLONOSCOPY WITH PROPOFOL  N/A 07/02/2017   Procedure: COLONOSCOPY WITH PROPOFOL ;  Surgeon: Gaylyn Gladis PENNER, MD;  Location: [facility] ENDOSCOPY;  Service: Endoscopy;  Laterality: N/A;   CORONARY ARTERY BYPASS GRAFT  1995   Procedure: 3v CABG (LIMA-LAD, SVG-D1, SVG-dLCx)   CYSTOSCOPY WITH LITHOLAPAXY N/A 03/18/2021   Procedure: CYSTOSCOPY WITH LITHOLAPAXY;  Surgeon: Francisca Redell BROCKS, MD;  Location: [facility] ORS;  Service: Urology;  Laterality: N/A;   ESOPHAGOGASTRODUODENOSCOPY (EGD) WITH PROPOFOL  N/A 05/27/2015   Procedure: ESOPHAGOGASTRODUODENOSCOPY (EGD) WITH PROPOFOL ;  Surgeon: Deward CINDERELLA Piedmont, MD;  Location: [facility] ENDOSCOPY;  Service: Gastroenterology;  Laterality: N/A;   HOLEP-LASER ENUCLEATION OF THE PROSTATE WITH MORCELLATION N/A 10/17/2019   Procedure: HOLEP-LASER ENUCLEATION OF THE PROSTATE WITH MORCELLATION;  Surgeon: Francisca Redell BROCKS, MD;  Location: [facility] ORS;  Service: Urology;  Laterality: N/A;   VASECTOMY      (Not in a hospital admission)  Social History   Socioeconomic History   Marital status: Married    Spouse name: [name]   Number of children: 3   Years of education: Not on file   Highest education level: Not on file  Occupational History   Occupation: Retired  Tobacco Use   Smoking status: Former    Current packs/day: 0.00    Average packs/day: 1.5 packs/day for 20.0 years (30.0 ttl pk-yrs)    Types: Cigarettes    Start date: 05/22/1973    Quit date: 05/22/1993    Years since quitting: 31.0    Passive exposure: Past   Smokeless tobacco: Never  Vaping Use   Vaping status: Never Used  Substance and Sexual Activity   Alcohol use: No    Alcohol/week: 0.0 standard drinks of alcohol   Drug use: No   Sexual activity: Not Currently    Partners: Female    Birth control/protection: None  Other Topics Concern   Not on file  Social History Narrative   Not on file   Social Drivers of Health   Tobacco Use: Medium Risk (05/20/2024)    Patient History    Smoking Tobacco Use: Former    Smokeless Tobacco Use: Never    Passive Exposure: Past  Physicist, Medical Strain: Low Risk  (04/30/2024)   Received from [facility]   Overall Financial Resource Strain (CARDIA)    Difficulty of Paying Living Expenses: Not very hard  Food Insecurity: No Food Insecurity (04/30/2024)   Received from [facility]   Epic    Within the past 12 months, you worried that your food would run out before you got the money to buy more.: Never true    Within the past 12 months, the food you bought just didn't last and you didn't have money to get more.: Never true  Transportation Needs: No Transportation Needs (04/30/2024)   Received from [facility] - Transportation    In the past 12 months, has lack of transportation kept you from medical  appointments or from getting medications?: No    Lack of Transportation (Non-Medical): No  Physical Activity: Not on file  Stress: Not on file  Social Connections: Not on file  Intimate Partner Violence: Not on file  Depression (EYV7-0): Not on file  Alcohol Screen: Not on file  Housing: Low Risk  (04/30/2024)   Received from [facility]   Epic    At any time in the past 12 months, were you homeless or living in a shelter (including now)?: No    In the past 12 months, how many times have you moved where you were living?: 0    In the last 12 months, was there a time when you were not able to pay the mortgage or rent on time?: No  Utilities: Patient Declined (03/30/2023)   Received from [facility] Utilities    Threatened with loss of utilities: Patient declined  Health Literacy: Not on file    Family History  Family history unknown: Yes     Vitals:   05/21/24 0000 05/21/24 0411 05/21/24 0534 05/21/24 0618  BP: 116/64  105/76   Pulse: 100  (!) 109   Resp: (!) 22  (!) 22   Temp:  99.4 F (37.4 C)     TempSrc:  Oral    SpO2: 92%  90% 93%  Weight:      Height:        PHYSICAL EXAM General: Chronically ill-appearing elderly male, well nourished, in no acute distress. HEENT: Normocephalic and atraumatic. Neck: No JVD.  Lungs: Normal respiratory effort on 10L [place].  Rhonchi. Heart: HRRR. Normal S1 and S2 without gallops or murmurs.  Abdomen: Non-distended appearing.  Msk: Normal strength and tone for age. Extremities: Warm and well perfused. No clubbing, cyanosis.  No edema.  Neuro: Alert and oriented X 3. Psych: Answers questions appropriately.   Labs: Basic Metabolic Panel: Recent Labs    05/20/24 2059 05/21/24 0255  NA 134* 132*  K 3.7 3.1*  CL 92* 94*  CO2 27 23  GLUCOSE 135* 133*  BUN 16 19  CREATININE 0.71 0.68  CALCIUM  9.1 9.0   Liver Function Tests: No results for input(s): AST, ALT, ALKPHOS, BILITOT, PROT, ALBUMIN in the last 72 hours. No results for input(s): LIPASE, AMYLASE in the last 72 hours. CBC: Recent Labs    05/20/24 2059 05/21/24 0255  WBC 8.6 4.8  HGB 17.1* 15.6  HCT 50.3 43.7  MCV 96.0 92.2  PLT 189 154   Cardiac Enzymes: No results for input(s): CKTOTAL, CKMB, CKMBINDEX, TROPONINIHS in the last 72 hours. BNP: No results for input(s): BNP in the last 72 hours. D-Dimer: No results for input(s): DDIMER in the last 72 hours. Hemoglobin A1C: No results for input(s): HGBA1C in the last 72 hours. Fasting Lipid Panel: No results for input(s): CHOL, HDL, LDLCALC, TRIG, CHOLHDL, LDLDIRECT in the last 72 hours. Thyroid  Function Tests: No results for input(s): TSH, T4TOTAL, T3FREE, THYROIDAB in the last 72 hours.  Invalid input(s): FREET3 Anemia Panel: No results for input(s): VITAMINB12, FOLATE, FERRITIN, TIBC, IRON, RETICCTPCT in the last 72 hours.   Radiology: CT Angio Chest PE W and/or Wo Contrast Result Date: 05/21/2024 EXAM: CTA CHEST 05/21/2024 01:15:38 AM TECHNIQUE: CTA  of the chest was performed after the administration of 75 mL of iohexol  (OMNIPAQUE ) 350 MG/ML injection. Multiplanar reformatted images are provided for review. MIP images are provided for review. Automated exposure control, iterative reconstruction, and/or weight based adjustment  of the mA/kV was utilized to reduce the radiation dose to as low as reasonably achievable. COMPARISON: Further compared to 02/17/2017. CLINICAL HISTORY: eval PE. sudden SOB and hypoxia. flash pulm edema clinically possible too FINDINGS: PULMONARY ARTERIES: Pulmonary arteries are adequately opacified for evaluation. No acute pulmonary embolus. Main pulmonary artery is normal in caliber. MEDIASTINUM: Coronary artery bypass grafting has been performed. The pericardium demonstrates no acute abnormality. Extensive atherosclerotic calcification within the thoracic aorta. LYMPH NODES: Shotty mediastinal and left hilar adenopathy is nonspecific, possible reactive in nature. No axillary lymphadenopathy. LUNGS AND PLEURA: Moderate emphysema. Bibasilar superimposed asymmetric airspace infiltrate, more severe within the left lower lobe. Extensive debris and airway impaction within the lower lobe bronchi bilaterally. Together, the findings suggest changes of aspiration, less likely multifocal infection. Clustered spiculated nodules within the right apex measuring up to 5 mm are stable since prior examination of 02/17/2017. No pneumothorax or pleural effusion. UPPER ABDOMEN: Limited images of the upper abdomen are unremarkable. SOFT TISSUES AND BONES: Osseous structures are age appropriate. No acute bone abnormality. No lytic or blastic bone lesion. No acute soft tissue abnormality. IMPRESSION: 1. No pulmonary embolism. 2. Bibasilar asymmetric airspace infiltrates, greatest in the left lower lobe, with extensive debris and airway impaction within the lower lobe bronchi bilaterally, most consistent with aspiration , less likely multifocal infection. 3.  Moderate emphysema. 4. Stable clustered spiculated right apical nodules measuring up to 5 mm since February 17, 2017. no follow-up imaging recommended. Electronically signed by: Dorethia Molt MD 05/21/2024 02:04 AM EST RP Workstation: [id number]   DG Chest Port 1 View Result Date: 05/20/2024 EXAM: 1 VIEW(S) XRAY OF THE CHEST 05/20/2024 08:54:00 PM COMPARISON: None available. CLINICAL HISTORY: Chest pain FINDINGS: LUNGS AND PLEURA: Retrocardiac consolidation, consistent with recent changes of acute lobar pneumonia or aspiration. Multiple small pulmonary nodules are seen within the right apex, likely corresponding to the pulmonary nodule seen within this region on prior chest CT of 02/17/2017. No pleural effusion. No pneumothorax. HEART AND MEDIASTINUM: Coronary artery bypass grafting has been performed. Atherosclerotic plaque present. No acute abnormality of the cardiac and mediastinal silhouettes. BONES AND SOFT TISSUES: Intact sternotomy wires. No acute osseous abnormality. IMPRESSION: 1. Retrocardiac consolidation, which may reflect acute lobar pneumonia or aspiration. 2. Multiple small right apical pulmonary nodules, likely corresponding to the nodules noted on prior chest CT dated Feb 17, 2017. Electronically signed by: Dorethia Molt MD 05/20/2024 10:38 PM EST RP Workstation: [id number]    ECHO 08/2021: NORMAL LEFT VENTRICULAR SYSTOLIC FUNCTION   WITH MODERATE LVH  NORMAL RIGHT VENTRICULAR SYSTOLIC FUNCTION  MILD VALVULAR REGURGITATION (See above)  NO VALVULAR STENOSIS  MILD AR, MR, TR, PR  EF 50%   TELEMETRY (personally reviewed): Sinus rhythm rate 80s  EKG (personally reviewed): sinus tach RBBB rate 114 bpm  Data reviewed by me 05/21/2024: last 24h vitals tele labs imaging I/O ED provider note, admission H&P  Principal Problem:   Aspiration pneumonia (HCC) Active Problems:   BPH (benign prostatic hyperplasia)   Sepsis due to pneumonia (HCC)   Dyslipidemia   Coronary artery disease    Essential hypertension    ASSESSMENT AND PLAN:  Timothy Bass is a 89 y.o. male  with a past medical history of coronary artery disease s/p CABG x3 with subsequent stent of SVG to PDA 12/2015, hypertension, hyperlipidemia, GERD, BPH who presented to the ED on 05/20/2024 for SOB with chest pain worse with cough. Cardiology was consulted for further evaluation.   # Aspiration pneumonia # Demand ischemia #  Coronary artery disease Patient presented with complaints of cough and shortness of breath.  Reportedly initially told admitted that he was having chest pain but adamantly denied this on my evaluation.  Troponins are mildly elevated and trended <15 > 45 > 54.  EKG is without acute ischemic changes. - Would hold off on IV heparin  at this time as he adamantly denies chest pain, EKG is nonischemic, and troponins are minimally elevated. - Resume home atorvastatin  80 mg daily and Plavix  75 mg daily. - Resume home metoprolol succinate 50 mg daily and valsartan-hydrochlorothiazide 160-25 mg daily. - Minimal troponin elevation most consistent with demand/supply mismatch and not ACS in the setting of suspected aspiration pneumonia.   This patient's plan of care was discussed and created with Dr. Wilburn and he is in agreement.  Signed: Danita Bloch, PA-C  05/21/2024, 7:12 AM [facility] Cardiology      "

## 2024-05-21 NOTE — H&P (Signed)
 "     [place]   PATIENT NAME: Timothy Bass    MR#:  [id number]  DATE OF BIRTH:  [birth year]  DATE OF ADMISSION:  05/20/2024  PRIMARY CARE PHYSICIAN: Glover Lenis, MD   Patient is coming from: Home  REQUESTING/REFERRING PHYSICIAN: Cyrena Mylar, MD  CHIEF COMPLAINT:   Chief Complaint  Patient presents with   Shortness of Breath  -Chest pain  HISTORY OF PRESENT ILLNESS:  DAYLON LAFAVOR is a 89 y.o. male with medical history significant for BPH, coronary artery disease, status post three-vessel CABG, status post PCI and stent, GERD, hypertension, dyslipidemia, PVD, BPH, and osteoarthritis, presented to the ER with acute onset of dyspnea with associated midsternal chest pain worsening with cough with no nausea or vomiting or diaphoresis.  He admitted to constipation.  His been having cough without wheezing.  No fever or chills.  No dysuria, oliguria or hematuria or flank pain.  No bleeding diathesis.  ED Course: When the patient came to the ER, BP was 194/97 with a heart rate of 114 respiratory rate of 25 and temperature 97.5 with pulse oximetry of 93 to 94% on room air and later 89% on room air and 93 on 2 L of O2 by nasal cannula..  Labs reviewed but hyponatremia 134 and hypochloremia of 92 and otherwise unremarkable BMP.  proBNP was only 336 and high-sensitivity troponin T was less than 15 and later 44.  CBC showed hemoconcentration.  Respiratory panel came back negative.  PT and INR were normal. EKG as reviewed by me : EKG showed sinus tachycardia with rate of 114 with wide QRS complexes and T wave inversion anteroseptally. Imaging: Portable chest x-ray showed retrocardiac consolidation that may reflect acute lobar pneumonia or aspiration and multiple small right apical pulmonary nodules likely corresponding to the nodules noted on prior chest CT dated 02/17/2017.  Chest CTA revealed the following: 1. No pulmonary embolism. 2. Bibasilar asymmetric airspace infiltrates, greatest in the  left lower lobe, with extensive debris and airway impaction within the lower lobe bronchi bilaterally, most consistent with aspiration , less likely multifocal infection. 3. Moderate emphysema. 4. Stable clustered spiculated right apical nodules measuring up to 5 mm since February 17, 2017. no follow-up imaging recommended.  The patient was given Nitropaste and I started him on IV Unasyn and Rocephin .  He will be admitted to a telemetry bed for further evaluation and management.  PAST MEDICAL HISTORY:   Past Medical History:  Diagnosis Date   Acquired trigger finger of both middle fingers    Aortic atherosclerosis    Arthritis    BPH without obstruction/lower urinary tract symptoms    Cholelithiasis    Chronic constipation    Coronary artery disease    a.) 3v CABG (LIMA-LAD, SVG-D1, SVG-dLCx) in 1995. b.) LHC 01/05/2016: EF 55%; 100% pLAD, 100% oD1, 100% dLAD, 30% p-mLCx, 65% dLCx-1, 100% dLCx-2, 95% distal graft lesion. Atretic LIMA-LAD; patent SVG-D1; sig disease SVG-PDA. 2.75 x 12 mm Resolute Integrity DES x 1 to dSVG-PDA graft.   DOE (dyspnea on exertion)    Dysuria    GERD (gastroesophageal reflux disease)    History of 2019 novel coronavirus disease (COVID-19) 05/28/2020   Hypercholesteremia    Hyperlipidemia    Hypertension    Long term current use of antithrombotics/antiplatelets    a.) Clopidogrel    Myocardial infarction [facility]) 1995   Nephrolithiasis    Nocturia    Prediabetes    Prostatic hypertrophy    PVD (peripheral vascular disease)  RBBB (right bundle branch block)    S/P CABG x 3 1995   a.) 3v CABG (LIMA-LAD, SVG-D1, SVG-dLCx)   Urinary frequency    Urinary incontinence     PAST SURGICAL HISTORY:   Past Surgical History:  Procedure Laterality Date   CARDIAC CATHETERIZATION N/A 01/05/2016   Procedure: LEFT HEART CATH AND CORS/GRAFTS ANGIOGRAPHY;  Surgeon: Wolm JINNY Rhyme, MD;  Location: [facility] INVASIVE CV LAB;  Service: Cardiovascular;  Laterality:  N/A;   CARDIAC CATHETERIZATION N/A 01/05/2016   Procedure: Coronary Stent Intervention (2.75 x 12 mm Resolute Intregity DES x1 to dSVG-PDA graft);  Surgeon: Marsa Dooms, MD;  Location: [facility] INVASIVE CV LAB;  Service: Cardiovascular;  Laterality: N/A;   CATARACT EXTRACTION W/PHACO Right 10/30/2022   Procedure: CATARACT EXTRACTION PHACO AND INTRAOCULAR LENS PLACEMENT (IOC) RIGHT  10.55  01:05.6;  Surgeon: Enola Feliciano Hugger, MD;  Location: [facility] SURGERY CNTR;  Service: Ophthalmology;  Laterality: Right;   CATARACT EXTRACTION W/PHACO Left 12/13/2022   Procedure: CATARACT EXTRACTION PHACO AND INTRAOCULAR LENS PLACEMENT (IOC) LEFT  5.19  00:46.9;  Surgeon: Enola Feliciano Hugger, MD;  Location: [facility] SURGERY CNTR;  Service: Ophthalmology;  Laterality: Left;   COLONOSCOPY WITH PROPOFOL  N/A 07/02/2017   Procedure: COLONOSCOPY WITH PROPOFOL ;  Surgeon: Gaylyn Gladis PENNER, MD;  Location: [facility] ENDOSCOPY;  Service: Endoscopy;  Laterality: N/A;   CORONARY ARTERY BYPASS GRAFT  1995   Procedure: 3v CABG (LIMA-LAD, SVG-D1, SVG-dLCx)   CYSTOSCOPY WITH LITHOLAPAXY N/A 03/18/2021   Procedure: CYSTOSCOPY WITH LITHOLAPAXY;  Surgeon: Francisca Redell BROCKS, MD;  Location: [facility] ORS;  Service: Urology;  Laterality: N/A;   ESOPHAGOGASTRODUODENOSCOPY (EGD) WITH PROPOFOL  N/A 05/27/2015   Procedure: ESOPHAGOGASTRODUODENOSCOPY (EGD) WITH PROPOFOL ;  Surgeon: Deward CINDERELLA Piedmont, MD;  Location: [facility] ENDOSCOPY;  Service: Gastroenterology;  Laterality: N/A;   HOLEP-LASER ENUCLEATION OF THE PROSTATE WITH MORCELLATION N/A 10/17/2019   Procedure: HOLEP-LASER ENUCLEATION OF THE PROSTATE WITH MORCELLATION;  Surgeon: Francisca Redell BROCKS, MD;  Location: [facility] ORS;  Service: Urology;  Laterality: N/A;   VASECTOMY      SOCIAL HISTORY:   Social History   Tobacco Use   Smoking status: Former    Current packs/day: 0.00    Average packs/day: 1.5 packs/day for 20.0 years (30.0 ttl pk-yrs)    Types: Cigarettes    Start date: 05/22/1973    Quit  date: 05/22/1993    Years since quitting: 31.0    Passive exposure: Past   Smokeless tobacco: Never  Substance Use Topics   Alcohol use: No    Alcohol/week: 0.0 standard drinks of alcohol    FAMILY HISTORY:   Family History  Family history unknown: Yes    DRUG ALLERGIES:  Allergies[1]  REVIEW OF SYSTEMS:   ROS As per history of present illness. All pertinent systems were reviewed above. Constitutional, HEENT, cardiovascular, respiratory, GI, GU, musculoskeletal, neuro, psychiatric, endocrine, integumentary and hematologic systems were reviewed and are otherwise negative/unremarkable except for positive findings mentioned above in the HPI.   MEDICATIONS AT HOME:   Prior to Admission medications  Medication Sig Start Date End Date Taking? Authorizing Provider  Acidophilus Lactobacillus CAPS Take 1 capsule by mouth daily.   Yes [provider]  atorvastatin  (LIPITOR) 80 MG tablet Take 1 tablet (80 mg total) by mouth daily. 01/06/16  Yes Rhyme Wolm JINNY, MD  Azelastine HCl 137 MCG/SPRAY SOLN Place 1 spray into both nostrils 2 (two) times daily. 04/14/24  Yes [provider]  cetirizine (ZYRTEC) 10 MG tablet Take 10 mg by mouth daily  as needed for allergies.   Yes [provider]  clopidogrel  (PLAVIX ) 75 MG tablet Take 1 tablet (75 mg total) by mouth daily with breakfast. 01/06/16  Yes Hester Wolm PARAS, MD  LINZESS 145 MCG CAPS capsule Take 145 mcg by mouth daily. 04/09/24  Yes [provider]  metoprolol succinate (TOPROL-XL) 50 MG 24 hr tablet Take 1 tablet by mouth daily. 03/15/22  Yes [provider]  montelukast (SINGULAIR) 10 MG tablet Take 1 tablet by mouth at bedtime. 04/07/22 05/21/24 Yes [provider]  polycarbophil (FIBERCON) 625 MG tablet Take 625 mg by mouth daily.   Yes [provider]  polyethylene glycol (MIRALAX / GLYCOLAX) 17 g packet Take 17 g by mouth daily as needed.   Yes [provider]   valsartan-hydrochlorothiazide (DIOVAN-HCT) 160-25 MG tablet Take 1 tablet by mouth daily. 04/20/24  Yes [provider]  Vibegron  (GEMTESA ) 75 MG TABS Take 1 tablet (75 mg total) by mouth daily. 11/22/23  Yes Francisca Redell BROCKS, MD      VITAL SIGNS:  Blood pressure 116/64, pulse 100, temperature 99.4 F (37.4 C), temperature source Oral, resp. rate (!) 22, height 5' 6 (1.676 m), weight 78 kg, SpO2 92%.  PHYSICAL EXAMINATION:  Physical Exam  GENERAL:  89 y.o.-year-old patient lying in the bed with no acute distress.  EYES: Pupils equal, round, reactive to light and accommodation. No scleral icterus. Extraocular muscles intact.  HEENT: Head atraumatic, normocephalic. Oropharynx and nasopharynx clear.  NECK:  Supple, no jugular venous distention. No thyroid  enlargement, no tenderness.  LUNGS: Diminished bibasilar breath sounds with bibasal and left midlung zone crackles.  No use of accessory muscles of respiration.  CARDIOVASCULAR: Regular rate and rhythm, S1, S2 normal. No murmurs, rubs, or gallops.  ABDOMEN: Soft, nondistended, nontender. Bowel sounds present. No organomegaly or mass.  EXTREMITIES: No pedal edema, cyanosis, or clubbing.  NEUROLOGIC: Cranial nerves II through XII are intact. Muscle strength 5/5 in all extremities. Sensation intact. Gait not checked.  PSYCHIATRIC: The patient is alert and oriented x 3.  Normal affect and good eye contact. SKIN: No obvious rash, lesion, or ulcer.   LABORATORY PANEL:   CBC Recent Labs  Lab 05/21/24 0255  WBC 4.8  HGB 15.6  HCT 43.7  PLT 154   ------------------------------------------------------------------------------------------------------------------  Chemistries  Recent Labs  Lab 05/21/24 0255  NA 132*  K 3.1*  CL 94*  CO2 23  GLUCOSE 133*  BUN 19  CREATININE 0.68  CALCIUM  9.0   ------------------------------------------------------------------------------------------------------------------  Cardiac  Enzymes No results for input(s): TROPONINI in the last 168 hours. ------------------------------------------------------------------------------------------------------------------  RADIOLOGY:  CT Angio Chest PE W and/or Wo Contrast Result Date: 05/21/2024 EXAM: CTA CHEST 05/21/2024 01:15:38 AM TECHNIQUE: CTA of the chest was performed after the administration of 75 mL of iohexol  (OMNIPAQUE ) 350 MG/ML injection. Multiplanar reformatted images are provided for review. MIP images are provided for review. Automated exposure control, iterative reconstruction, and/or weight based adjustment of the mA/kV was utilized to reduce the radiation dose to as low as reasonably achievable. COMPARISON: Further compared to 02/17/2017. CLINICAL HISTORY: eval PE. sudden SOB and hypoxia. flash pulm edema clinically possible too FINDINGS: PULMONARY ARTERIES: Pulmonary arteries are adequately opacified for evaluation. No acute pulmonary embolus. Main pulmonary artery is normal in caliber. MEDIASTINUM: Coronary artery bypass grafting has been performed. The pericardium demonstrates no acute abnormality. Extensive atherosclerotic calcification within the thoracic aorta. LYMPH NODES: Shotty mediastinal and left hilar adenopathy is nonspecific, possible reactive in nature. No axillary lymphadenopathy.  LUNGS AND PLEURA: Moderate emphysema. Bibasilar superimposed asymmetric airspace infiltrate, more severe within the left lower lobe. Extensive debris and airway impaction within the lower lobe bronchi bilaterally. Together, the findings suggest changes of aspiration, less likely multifocal infection. Clustered spiculated nodules within the right apex measuring up to 5 mm are stable since prior examination of 02/17/2017. No pneumothorax or pleural effusion. UPPER ABDOMEN: Limited images of the upper abdomen are unremarkable. SOFT TISSUES AND BONES: Osseous structures are age appropriate. No acute bone abnormality. No lytic or blastic  bone lesion. No acute soft tissue abnormality. IMPRESSION: 1. No pulmonary embolism. 2. Bibasilar asymmetric airspace infiltrates, greatest in the left lower lobe, with extensive debris and airway impaction within the lower lobe bronchi bilaterally, most consistent with aspiration , less likely multifocal infection. 3. Moderate emphysema. 4. Stable clustered spiculated right apical nodules measuring up to 5 mm since February 17, 2017. no follow-up imaging recommended. Electronically signed by: Dorethia Molt MD 05/21/2024 02:04 AM EST RP Workstation: [id number]   DG Chest Port 1 View Result Date: 05/20/2024 EXAM: 1 VIEW(S) XRAY OF THE CHEST 05/20/2024 08:54:00 PM COMPARISON: None available. CLINICAL HISTORY: Chest pain FINDINGS: LUNGS AND PLEURA: Retrocardiac consolidation, consistent with recent changes of acute lobar pneumonia or aspiration. Multiple small pulmonary nodules are seen within the right apex, likely corresponding to the pulmonary nodule seen within this region on prior chest CT of 02/17/2017. No pleural effusion. No pneumothorax. HEART AND MEDIASTINUM: Coronary artery bypass grafting has been performed. Atherosclerotic plaque present. No acute abnormality of the cardiac and mediastinal silhouettes. BONES AND SOFT TISSUES: Intact sternotomy wires. No acute osseous abnormality. IMPRESSION: 1. Retrocardiac consolidation, which may reflect acute lobar pneumonia or aspiration. 2. Multiple small right apical pulmonary nodules, likely corresponding to the nodules noted on prior chest CT dated Feb 17, 2017. Electronically signed by: Dorethia Molt MD 05/20/2024 10:38 PM EST RP Workstation: [id number]      IMPRESSION AND PLAN:  Assessment and Plan: * Aspiration pneumonia (HCC) - The patient will be admitted to the medical telemetry bed. - Will continue antibiotic therapy with IV Unasyn and Zithromax. - Will be hydrated with IV lactated ringer. - Mucolytic therapy and bronchodilator therapy will  be provided.   Sepsis due to pneumonia [facility]) - This is manifested by tachycardia and tachypnea - He will be hydrated with IV lactated ringer. - He will be placed on IV Unasyn and Zithromax as mentioned above. - Will follow blood cultures. - Will follow lactic acid level.  Coronary artery disease - Though his chest pain is likely atypical due to his pneumonia, will follow serial troponins especially given elevated troponin with delta. - Will continue statin therapy, Toprol-XL and Imdur  as well as Plavix ,.  Essential hypertension - Will continue antihypertensive therapy.  Dyslipidemia - Continue statin therapy and gemfibrozil .  BPH (benign prostatic hyperplasia) - Will continue Proscar    DVT prophylaxis: Lovenox.  Advanced Care Planning:  Code Status: full code.  Family Communication:  The plan of care was discussed in details with the patient (and family). I answered all questions. The patient agreed to proceed with the above mentioned plan. Further management will depend upon hospital course. Disposition Plan: Back to previous home environment Consults called: none.  All the records are reviewed and case discussed with ED provider.  Status is: Inpatient  At the time of the admission, it appears that the appropriate admission status for this patient is inpatient.  This is judged to be reasonable and necessary in order to provide  the required intensity of service to ensure the patient's safety given the presenting symptoms, physical exam findings and initial radiographic and laboratory data in the context of comorbid conditions.  The patient requires inpatient status due to high intensity of service, high risk of further deterioration and high frequency of surveillance required.  I certify that at the time of admission, it is my clinical judgment that the patient will require inpatient hospital care extending more than 2 midnights.                            Dispo: The patient is  from: Home              Anticipated d/c is to: Home              Patient currently is not medically stable to d/c.              Difficult to place patient: No  Madison DELENA Peaches M.D on 05/21/2024 at 4:54 AM  [facility]   From 7 PM-7 AM, contact night-coverage [email or web address]  CC: Primary care physician; Glover Lenis, MD     [1]  Allergies Allergen Reactions   Aspirin      Pt.states aspirin  gives pt. high blood pressure   Peanuts [Peanut Oil] Itching   "

## 2024-05-21 NOTE — ED Notes (Signed)
 Pulled 0800 duoneb to use at 0554 as PRN dose. Mansy MD stated to give PRN dose but no order in Epic. Natalie Donati-Gamon, NP, put in one time dose order after med had been pulled. Had to override pyxis to pull dose for 0800.

## 2024-05-21 NOTE — ED Notes (Addendum)
 Christina RT assisted with setting up HFNC. Pt currently on 10L HFNC. Pt SpO2 at 93%.

## 2024-05-21 NOTE — Assessment & Plan Note (Signed)
-   This is manifested by tachycardia and tachypnea - He will be hydrated with IV lactated ringer. - He will be placed on IV Unasyn and Zithromax as mentioned above. - Will follow blood cultures. - Will follow lactic acid level.

## 2024-05-21 NOTE — Assessment & Plan Note (Signed)
Will continue Proscar.

## 2024-05-21 NOTE — ED Notes (Signed)
 Pt eating his dinner tray, family at bedside. Provided pt with warm blanket no other needs

## 2024-05-21 NOTE — Assessment & Plan Note (Addendum)
 Continue statin therapy and gemfibrozil 

## 2024-05-21 NOTE — ED Notes (Addendum)
 Per pt request, this RN assisted pt to recliner. Pt's SpO2 consistently around 89%. This RN turned [place] to 4L. Pt SpO2 at 90%. Mansy MD notified. Per MD, SpO2 needs to stay above 93%. This RN then turned [place] to 5L, with pt SpO2 not improving. Will administer PRN duoneb.

## 2024-05-21 NOTE — Progress Notes (Signed)
" °  INTERVAL PROGRESS NOTE FARDEEN STEINBERGER- 89 y.o. male  LOS: 0 __________________________________________________________________  SUBJECTIVE: Admitted 05/20/2024 with cc of  Chief Complaint  Patient presents with   Shortness of Breath   Since admission, stable on 6L HFNC. Evaluated by cardiology for elevated troponins.   OBJECTIVE: Blood pressure 105/76, pulse (!) 109, temperature 99.4 F (37.4 C), temperature source Oral, resp. rate (!) 22, height 5' 6 (1.676 m), weight 78 kg, SpO2 93%.  General: NAD, pleasant, able to participate in exam Cardiac: RRR, normal heart sounds, no murmurs. 2+ radial and PT pulses bilaterally Respiratory: speaking in short sentences. Crackles present bilaterally. Inspiration illicits cough Extremities: no edema. WWP. Skin: warm and dry, no rashes noted Neuro: alert and oriented, no focal deficits Psych: Normal affect and mood   ASSESSMENT/PLAN:  I have reviewed the full H&P by Dr. Lawence, and I reviewed pertinent findings.  In addition: Cardiology has evaluated and does not suspect acute ACS.  - no heparin  at this time - continue atorvastatin , plavix , metoprolol, valsartan-hydrochlorothiazide   Principal Problem:   Aspiration pneumonia (HCC) Active Problems:   Sepsis due to pneumonia [facility])   Coronary artery disease   BPH (benign prostatic hyperplasia)   Dyslipidemia   Essential hypertension   Marien LITTIE Piety, DO [facility] 05/21/2024, 8:57 AM    [email or web address] Available by Epic secure chat 7AM-7PM. If 7PM-7AM, please contact night-coverage   No Charge   "

## 2024-05-22 DIAGNOSIS — R338 Other retention of urine: Secondary | ICD-10-CM | POA: Diagnosis not present

## 2024-05-22 DIAGNOSIS — R0902 Hypoxemia: Secondary | ICD-10-CM | POA: Diagnosis not present

## 2024-05-22 DIAGNOSIS — J690 Pneumonitis due to inhalation of food and vomit: Secondary | ICD-10-CM | POA: Diagnosis not present

## 2024-05-22 DIAGNOSIS — R0602 Shortness of breath: Secondary | ICD-10-CM | POA: Diagnosis not present

## 2024-05-22 DIAGNOSIS — J188 Other pneumonia, unspecified organism: Secondary | ICD-10-CM | POA: Diagnosis not present

## 2024-05-22 MED ORDER — IPRATROPIUM-ALBUTEROL 0.5-2.5 (3) MG/3ML IN SOLN
3.0000 mL | Freq: Three times a day (TID) | RESPIRATORY_TRACT | Status: DC
  Administered 2024-05-22 – 2024-05-25 (×9): 3 mL via RESPIRATORY_TRACT
  Filled 2024-05-22 (×9): qty 3

## 2024-05-22 NOTE — Plan of Care (Signed)

## 2024-05-22 NOTE — Evaluation (Signed)
 Physical Therapy Evaluation Patient Details Name: Timothy Bass MRN: [id number] DOB: 1933-01-25 Today's Date: 05/22/2024  History of Present Illness  Pt is an 89 y/o M admitted on 05/21/24 after presenting with c/o dyspnea with associated mid sternal chest pain worsening with cough. Pt is being treated for aspiration PNA. PMH: BPH, CAD, CABG, PCI & stent, GERD, HTN, dyslipidemia, PVD, OA, MI, R BBB, urinary incontinence  Clinical Impression  Pt seen for PT evaluation with pt agreeable. Pt reports he lives with his wife, ambulates without AD, has had a few near falls but no falls. On this date, pt is able to ambulate around unit with RW & supervision, requires cuing re: proper use of AD, positioning within base of it. Pt weaned to 2L/min via nasal cannula with lowest SpO2 of 87% but recovering to 91% at end of session. At this time, recommend ongoing PT services to progress mobility with LRAD, balance, endurance.        If plan is discharge home, recommend the following: A little help with walking and/or transfers;A little help with bathing/dressing/bathroom;Assistance with cooking/housework;Assist for transportation;Help with stairs or ramp for entrance   Can travel by private vehicle        Equipment Recommendations None recommended by PT  Recommendations for Other Services       Functional Status Assessment Patient has had a recent decline in their functional status and demonstrates the ability to make significant improvements in function in a reasonable and predictable amount of time.     Precautions / Restrictions Precautions Precautions: Fall Restrictions Weight Bearing Restrictions Per Provider Order: No      Mobility  Bed Mobility Overal bed mobility: Needs Assistance Bed Mobility: Supine to Sit     Supine to sit: Modified independent (Device/Increase time), HOB elevated, Used rails          Transfers Overall transfer level: Needs assistance Equipment used: None,  Rolling walker (2 wheels) Transfers: Sit to/from Stand Sit to Stand: Supervision           General transfer comment: decreased awareness re: hand placement during transfer sit>stand with RW    Ambulation/Gait Ambulation/Gait assistance: Supervision Gait Distance (Feet): 185 Feet Assistive device: Rolling walker (2 wheels)   Gait velocity: decreased     General Gait Details: Standing marching in place with LUE HHA & CGA<>Min assist. Progressed to ambulating around nurses station with RW & supervision, cuing re: positioning in relation to RW (ambulatew within base vs pushing it out in front)  Careers Information Officer     Tilt Bed    Modified Rankin (Stroke Patients Only)       Balance Overall balance assessment: Needs assistance Sitting-balance support: Feet supported Sitting balance-Leahy Scale: Good     Standing balance support: During functional activity, No upper extremity supported Standing balance-Leahy Scale: Good                               Pertinent Vitals/Pain Pain Assessment Pain Assessment: No/denies pain    Home Living Family/patient expects to be discharged to:: Private residence Living Arrangements: Spouse/significant other Available Help at Discharge: Family Type of Home: House Home Access: Stairs to enter   Secretary/administrator of Steps: 15 Alternate Level Stairs-Number of Steps: reports a basement & a 2nd level Home Layout: Multi-level Home Equipment: Cane - single point      Prior  Function Prior Level of Function : Independent/Modified Independent             Mobility Comments: independent without AD, denies falls but notes a couple near falls       Extremity/Trunk Assessment   Upper Extremity Assessment Upper Extremity Assessment: Overall WFL for tasks assessed    Lower Extremity Assessment Lower Extremity Assessment: Overall WFL for tasks assessed;Generalized weakness    Cervical /  Trunk Assessment Cervical / Trunk Assessment: Normal  Communication   Communication Communication: No apparent difficulties Factors Affecting Communication:  (Utilized iPad interpreter (Shimmi [phone]))    Cognition Arousal: Alert Behavior During Therapy: WFL for tasks assessed/performed   PT - Cognitive impairments: No apparent impairments                         Following commands: Intact       Cueing Cueing Techniques: Verbal cues     General Comments General comments (skin integrity, edema, etc.): continent void standing in bathroom. Pt received in bed with nasal cannula not fully in nose on 5L/min, SpO2 >90% so weaned to 2L/min for session & pt able to maintain SpO2 87% or >.    Exercises     Assessment/Plan    PT Assessment Patient needs continued PT services  PT Problem List Decreased strength;Cardiopulmonary status limiting activity;Decreased range of motion;Decreased activity tolerance;Decreased mobility;Decreased balance;Decreased knowledge of use of DME       PT Treatment Interventions DME instruction;Therapeutic exercise;Gait training;Balance training;Stair training;Functional mobility training;Therapeutic activities;Patient/family education;Neuromuscular re-education    PT Goals (Current goals can be found in the Care Plan section)  Acute Rehab PT Goals Patient Stated Goal: none stated PT Goal Formulation: With patient Time For Goal Achievement: 06/05/24 Potential to Achieve Goals: Good    Frequency Min 3X/week     Co-evaluation               AM-PAC PT 6 Clicks Mobility  Outcome Measure Help needed turning from your back to your side while in a flat bed without using bedrails?: None Help needed moving from lying on your back to sitting on the side of a flat bed without using bedrails?: None Help needed moving to and from a bed to a chair (including a wheelchair)?: A Little Help needed standing up from a chair using your arms (e.g.,  wheelchair or bedside chair)?: A Little Help needed to walk in hospital room?: A Little Help needed climbing 3-5 steps with a railing? : A Little 6 Click Score: 20    End of Session Equipment Utilized During Treatment: Oxygen Activity Tolerance: Patient tolerated treatment well Patient left: in bed;with call bell/phone within reach;with bed alarm set (in chair position) Nurse Communication:  (O2) PT Visit Diagnosis: Unsteadiness on feet (R26.81);Muscle weakness (generalized) (M62.81)    Time: 8862-8789 PT Time Calculation (min) (ACUTE ONLY): 33 min   Charges:   PT Evaluation $PT Eval Low Complexity: 1 Low PT Treatments $Therapeutic Activity: 8-22 mins PT General Charges $$ ACUTE PT VISIT: 1 Visit         Richerd Pinal, PT, DPT 05/22/2024, 12:44 PM   Richerd CHRISTELLA Pinal 05/22/2024, 12:43 PM

## 2024-05-22 NOTE — Progress Notes (Signed)
 " PROGRESS NOTE Timothy Bass    DOB: [birth year], 89 y.o.  FMW:[id number]    Code Status: Full Code   DOA: 05/20/2024   LOS: 1  Brief hospital course  Timothy Bass is a 89 y.o. male with a PMH significant for BPH, coronary artery disease, status post three-vessel CABG, status post PCI and stent, GERD, hypertension, dyslipidemia, PVD, BPH, and osteoarthritis. They presented to the ER with acute onset of dyspnea with associated midsternal chest pain worsening with cough.   ED Course: BP was 194/97 with a heart rate of 114 respiratory rate of 25 and temperature 97.5 with pulse oximetry of 93 to 94% on room air and later 89% on room air and 93 on 2 L of O2 by nasal cannula..  Labs reviewed but hyponatremia 134 and hypochloremia of 92 and otherwise unremarkable BMP.  proBNP was only 336 and high-sensitivity troponin T was less than 15 and later 44.  CBC showed hemoconcentration.  Respiratory panel came back negative.  PT and INR were normal. EKG: sinus tachycardia with rate of 114 with wide QRS complexes and T wave inversion anteroseptally. Imaging: Portable chest x-ray showed retrocardiac consolidation that may reflect acute lobar pneumonia or aspiration and multiple small right apical pulmonary nodules likely corresponding to the nodules noted on prior chest CT dated 02/17/2017.   Chest CTA revealed the following: 1. No pulmonary embolism. 2. Bibasilar asymmetric airspace infiltrates, greatest in the left lower lobe, with extensive debris and airway impaction within the lower lobe bronchi bilaterally, most consistent with aspiration , less likely multifocal infection. 3. Moderate emphysema. 4. Stable clustered spiculated right apical nodules measuring up to 5 mm since February 17, 2017. no follow-up imaging recommended.   The patient was given Nitropaste and IV Unasyn and Rocephin .  Cardiology was consulted and ruled out ACS.   05/22/2024 -eating without O2 on today. Checked his O2 sats which were 88%.  He denies dyspnea.   Assessment & Plan  Principal Problem:   Aspiration pneumonia (HCC) Active Problems:   Sepsis due to pneumonia [facility])   Coronary artery disease   BPH (benign prostatic hyperplasia)   Dyslipidemia   Essential hypertension  Sepsis due to pneumonia- criteria resolved s/p IVF Aspiration pneumonia (HCC) - Will continue antibiotic therapy with IV Unasyn and Zithromax. - Mucolytic therapy and bronchodilator therapy will be provided.  - Will follow blood cultures. - Will follow lactic acid level. - PT/OT   Demand mismatch  Coronary artery disease- chest pain resolved. Cardiology was consulted and ruled out ACS.  - Will continue statin therapy, Toprol-XL and Imdur  as well as Plavix ,.   Essential hypertension - Will continue antihypertensive therapy.   Dyslipidemia - Continue statin therapy and gemfibrozil .   BPH (benign prostatic hyperplasia) - Will continue Proscar   Body mass index is 27.76 kg/m.  VTE ppx: enoxaparin (LOVENOX) injection 40 mg Start: 05/21/24 0800  Diet:     Diet   Diet Heart Room service appropriate? Yes; Fluid consistency: Thin   Consultants: Cardiology   Subjective 05/22/2024    Pt reports feeling well. Has no shortness of breath. Denies chest pain.    Objective  Blood pressure (!) 152/65, pulse 91, temperature 98.8 F (37.1 C), temperature source Oral, resp. rate 19, height 5' 6 (1.676 m), weight 78 kg, SpO2 97%.  Intake/Output Summary (Last 24 hours) at 05/22/2024 0750 Last data filed at 05/22/2024 0700 Gross per 24 hour  Intake 1072.94 ml  Output 225 ml  Net 847.94 ml  Filed Weights   05/20/24 2037 05/20/24 2105  Weight: 78 kg 78 kg    Physical Exam:  General: awake, alert, NAD HEENT: atraumatic, clear conjunctiva, anicteric sclera, MMM, hearing grossly normal Respiratory: normal respiratory effort. Cardiovascular: extremities well perfused, quick capillary refill, normal S1/S2, RRR, no JVD, murmurs Nervous: A&O x3. no  gross focal neurologic deficits, normal speech Extremities: moves all equally, no edema, normal tone Skin: dry, intact, normal temperature, normal color. No rashes, lesions or ulcers on exposed skin Psychiatry: normal mood, congruent affect  Labs   I have personally reviewed the following labs and imaging studies CBC    Component Value Date/Time   WBC 4.8 05/21/2024 0255   RBC 4.74 05/21/2024 0255   HGB 15.6 05/21/2024 0255   HCT 43.7 05/21/2024 0255   PLT 154 05/21/2024 0255   MCV 92.2 05/21/2024 0255   MCH 32.9 05/21/2024 0255   MCHC 35.7 05/21/2024 0255   RDW 12.1 05/21/2024 0255   LYMPHSABS 1.4 02/17/2017 2045   MONOABS 0.5 02/17/2017 2045   EOSABS 0.1 02/17/2017 2045   BASOSABS 0.0 02/17/2017 2045      Latest Ref Rng & Units 05/21/2024    2:55 AM 05/20/2024    8:59 PM 02/29/2024    4:16 PM  BMP  Glucose 70 - 99 mg/dL 866  864  886   BUN 8 - 23 mg/dL 19  16  14    Creatinine 0.61 - 1.24 mg/dL 9.31  9.28  9.30   Sodium 135 - 145 mmol/L 132  134  129   Potassium 3.5 - 5.1 mmol/L 3.1  3.7  4.0   Chloride 98 - 111 mmol/L 94  92  92   CO2 22 - 32 mmol/L 23  27  25    Calcium  8.9 - 10.3 mg/dL 9.0  9.1  9.3     CT Angio Chest PE W and/or Wo Contrast Result Date: 05/21/2024 EXAM: CTA CHEST 05/21/2024 01:15:38 AM TECHNIQUE: CTA of the chest was performed after the administration of 75 mL of iohexol  (OMNIPAQUE ) 350 MG/ML injection. Multiplanar reformatted images are provided for review. MIP images are provided for review. Automated exposure control, iterative reconstruction, and/or weight based adjustment of the mA/kV was utilized to reduce the radiation dose to as low as reasonably achievable. COMPARISON: Further compared to 02/17/2017. CLINICAL HISTORY: eval PE. sudden SOB and hypoxia. flash pulm edema clinically possible too FINDINGS: PULMONARY ARTERIES: Pulmonary arteries are adequately opacified for evaluation. No acute pulmonary embolus. Main pulmonary artery is normal in  caliber. MEDIASTINUM: Coronary artery bypass grafting has been performed. The pericardium demonstrates no acute abnormality. Extensive atherosclerotic calcification within the thoracic aorta. LYMPH NODES: Shotty mediastinal and left hilar adenopathy is nonspecific, possible reactive in nature. No axillary lymphadenopathy. LUNGS AND PLEURA: Moderate emphysema. Bibasilar superimposed asymmetric airspace infiltrate, more severe within the left lower lobe. Extensive debris and airway impaction within the lower lobe bronchi bilaterally. Together, the findings suggest changes of aspiration, less likely multifocal infection. Clustered spiculated nodules within the right apex measuring up to 5 mm are stable since prior examination of 02/17/2017. No pneumothorax or pleural effusion. UPPER ABDOMEN: Limited images of the upper abdomen are unremarkable. SOFT TISSUES AND BONES: Osseous structures are age appropriate. No acute bone abnormality. No lytic or blastic bone lesion. No acute soft tissue abnormality. IMPRESSION: 1. No pulmonary embolism. 2. Bibasilar asymmetric airspace infiltrates, greatest in the left lower lobe, with extensive debris and airway impaction within the lower lobe bronchi bilaterally, most consistent with aspiration ,  less likely multifocal infection. 3. Moderate emphysema. 4. Stable clustered spiculated right apical nodules measuring up to 5 mm since February 17, 2017. no follow-up imaging recommended. Electronically signed by: Dorethia Molt MD 05/21/2024 02:04 AM EST RP Workstation: [id number]   DG Chest Port 1 View Result Date: 05/20/2024 EXAM: 1 VIEW(S) XRAY OF THE CHEST 05/20/2024 08:54:00 PM COMPARISON: None available. CLINICAL HISTORY: Chest pain FINDINGS: LUNGS AND PLEURA: Retrocardiac consolidation, consistent with recent changes of acute lobar pneumonia or aspiration. Multiple small pulmonary nodules are seen within the right apex, likely corresponding to the pulmonary nodule seen within  this region on prior chest CT of 02/17/2017. No pleural effusion. No pneumothorax. HEART AND MEDIASTINUM: Coronary artery bypass grafting has been performed. Atherosclerotic plaque present. No acute abnormality of the cardiac and mediastinal silhouettes. BONES AND SOFT TISSUES: Intact sternotomy wires. No acute osseous abnormality. IMPRESSION: 1. Retrocardiac consolidation, which may reflect acute lobar pneumonia or aspiration. 2. Multiple small right apical pulmonary nodules, likely corresponding to the nodules noted on prior chest CT dated Feb 17, 2017. Electronically signed by: Dorethia Molt MD 05/20/2024 10:38 PM EST RP Workstation: [id number]    Disposition Plan & Communication  Patient status: Inpatient  Admitted From: Home Planned disposition location: Home Anticipated discharge date: 1/2 pending O2 wean   Family Communication: none at bedside. Lives with son.     Author: Marien LITTIE Piety, DO [facility] 05/22/2024, 7:50 AM   Available by Epic secure chat 7AM-7PM. If 7PM-7AM, please contact night-coverage.  [facility] contact information found on [email or web address].  "

## 2024-05-23 DIAGNOSIS — J690 Pneumonitis due to inhalation of food and vomit: Secondary | ICD-10-CM | POA: Diagnosis not present

## 2024-05-23 LAB — RENAL FUNCTION PANEL
Albumin: 3.5 g/dL (ref 3.5–5.0)
Anion gap: 12 (ref 5–15)
BUN: 23 mg/dL (ref 8–23)
CO2: 26 mmol/L (ref 22–32)
Calcium: 8.8 mg/dL — ABNORMAL LOW (ref 8.9–10.3)
Chloride: 94 mmol/L — ABNORMAL LOW (ref 98–111)
Creatinine, Ser: 0.63 mg/dL (ref 0.61–1.24)
GFR, Estimated: >60 mL/min
Glucose, Bld: 113 mg/dL — ABNORMAL HIGH (ref 70–99)
Phosphorus: 1.5 mg/dL — ABNORMAL LOW (ref 2.5–4.6)
Potassium: 3.2 mmol/L — ABNORMAL LOW (ref 3.5–5.1)
Sodium: 132 mmol/L — ABNORMAL LOW (ref 135–145)

## 2024-05-23 LAB — MAGNESIUM: Magnesium: 2 mg/dL (ref 1.7–2.4)

## 2024-05-23 MED ORDER — ACETAMINOPHEN 500 MG PO TABS: 1000.0000 mg | ORAL_TABLET | Freq: Once | ORAL | Status: DC

## 2024-05-23 MED ORDER — AZITHROMYCIN 250 MG PO TABS
500.0000 mg | ORAL_TABLET | Freq: Every day | ORAL | Status: AC
  Administered 2024-05-24 – 2024-05-25 (×2): 500 mg via ORAL
  Filled 2024-05-23 (×2): qty 2

## 2024-05-23 MED ORDER — LORATADINE 10 MG PO TABS
10.0000 mg | ORAL_TABLET | Freq: Every day | ORAL | Status: DC
  Administered 2024-05-23 – 2024-05-25 (×3): 10 mg via ORAL
  Filled 2024-05-23 (×3): qty 1

## 2024-05-23 MED ORDER — PHENOL 1.4 % MT LIQD: 1.0000 | OROMUCOSAL | Status: DC | PRN

## 2024-05-23 MED ORDER — HYDROCHLOROTHIAZIDE 50 MG PO TABS
50.0000 mg | ORAL_TABLET | Freq: Every day | ORAL | Status: DC
  Filled 2024-05-23: qty 1

## 2024-05-23 MED ORDER — HYDRALAZINE HCL 20 MG/ML IJ SOLN
5.0000 mg | Freq: Once | INTRAMUSCULAR | Status: AC
  Administered 2024-05-23: 5 mg via INTRAVENOUS
  Filled 2024-05-23: qty 1

## 2024-05-23 MED ORDER — IRBESARTAN 150 MG PO TABS: 150.0000 mg | ORAL_TABLET | Freq: Every day | ORAL | Status: DC

## 2024-05-23 MED ORDER — POTASSIUM CHLORIDE CRYS ER 20 MEQ PO TBCR
40.0000 meq | EXTENDED_RELEASE_TABLET | Freq: Two times a day (BID) | ORAL | Status: AC
  Administered 2024-05-23 (×2): 40 meq via ORAL
  Filled 2024-05-23 (×2): qty 2

## 2024-05-23 MED ORDER — MAGNESIUM SULFATE IN D5W 1-5 GM/100ML-% IV SOLN
1.0000 g | Freq: Once | INTRAVENOUS | Status: AC
  Administered 2024-05-23: 1 g via INTRAVENOUS
  Filled 2024-05-23: qty 100

## 2024-05-23 NOTE — Plan of Care (Signed)

## 2024-05-23 NOTE — Plan of Care (Signed)
  Problem: Fluid Volume: Goal: Hemodynamic stability will improve Outcome: Progressing   Problem: Clinical Measurements: Goal: Diagnostic test results will improve Outcome: Progressing Goal: Signs and symptoms of infection will decrease Outcome: Progressing   Problem: Respiratory: Goal: Ability to maintain adequate ventilation will improve Outcome: Progressing   Problem: Education: Goal: Knowledge of General Education information will improve Description: Including pain rating scale, medication(s)/side effects and non-pharmacologic comfort measures Outcome: Progressing   Problem: Health Behavior/Discharge Planning: Goal: Ability to manage health-related needs will improve Outcome: Progressing   Problem: Clinical Measurements: Goal: Ability to maintain clinical measurements within normal limits will improve Outcome: Progressing Goal: Will remain free from infection Outcome: Progressing Goal: Diagnostic test results will improve Outcome: Progressing Goal: Respiratory complications will improve Outcome: Progressing

## 2024-05-23 NOTE — Progress Notes (Signed)
 Provider informed pt went into aflutter at last night until about 2200. 1900, pt currently NSR.

## 2024-05-23 NOTE — Progress Notes (Signed)
 Mobility Specialist - Progress Note  Pre-mobility:SpO2-92% During mobility: SpO2-87% recovered to 90% in < 2 min Post-mobility:SPO2- 93%   05/23/24 1700  Mobility  Activity Ambulated with assistance;Stood at bedside;Dangled on edge of bed  Level of Assistance Contact guard assist, steadying assist  Assistive Device Front wheel walker  Distance Ambulated (ft) 180 ft  Range of Motion/Exercises Active  Activity Response Tolerated well  Mobility visit 1 Mobility  Mobility Specialist Start Time (ACUTE ONLY) 1603  Mobility Specialist Stop Time (ACUTE ONLY) 1633  Mobility Specialist Time Calculation (min) (ACUTE ONLY) 30 min   Pt was supine in bed with the HOB elevated on O2 @ 2L upon entry. Pt agreed to mobility. Pt O2 vitals were taken throughout activity as a precaution. Pt is able today to get to the EOB independently with bed features. Pt is able today to STS with 2 WW with minA. Pt ambulated well. Pt did use recovery break to check vitals and apply breathing techniques. After activity pt returned to the room back in bed with needs in reach and bed alarm on.  Clem Rodes Mobility Specialist 05/23/2024, 5:35 PM

## 2024-05-23 NOTE — Progress Notes (Signed)
 Informed Dr Lenon about BP 193/90 and 187/87.  No new orders at this time.  Will continue plan of care.  Volanda Beverley BIRCH, RN

## 2024-05-23 NOTE — Care Management Important Message (Signed)
 Important Message  Patient Details  Name: Timothy Bass MRN: [id number] Date of Birth: [birth year]   Important Message Given:  Yes - Medicare IM     Rojelio [name] 05/23/2024, 12:41 PM

## 2024-05-23 NOTE — Progress Notes (Signed)
 RN set Acapella up with pt

## 2024-05-23 NOTE — Progress Notes (Signed)
 Physical Therapy Treatment Patient Details Name: Timothy Bass MRN: [id number] DOB: May 01, 1933 Today's Date: 05/23/2024   History of Present Illness Pt is an 89 y/o M admitted on 05/21/24 after presenting with c/o dyspnea with associated mid sternal chest pain worsening with cough. Pt is being treated for aspiration PNA. PMH: BPH, CAD, CABG, PCI & stent, GERD, HTN, dyslipidemia, PVD, OA, MI, R BBB, urinary incontinence    PT Comments  Pt agrees to OOB to chair but gait limited by coughing and frequent expellation of thick mucous.  Blood tinged - bright red.  Tissue saved and RN notified.  Pt does remain in chair with education to remain up to help bring up mucous.  Needs met and safety on.  Language line interpreter used for session.    If plan is discharge home, recommend the following: A little help with walking and/or transfers;A little help with bathing/dressing/bathroom;Assistance with cooking/housework;Assist for transportation;Help with stairs or ramp for entrance   Can travel by private vehicle        Equipment Recommendations  None recommended by PT    Recommendations for Other Services       Precautions / Restrictions Precautions Precautions: Fall Restrictions Weight Bearing Restrictions Per Provider Order: No     Mobility  Bed Mobility Overal bed mobility: Modified Independent               Patient Response: Cooperative  Transfers Overall transfer level: Needs assistance   Transfers: Sit to/from Stand Sit to Stand: Supervision                Ambulation/Gait Ambulation/Gait assistance: Supervision Gait Distance (Feet): 3 Feet Assistive device: None   Gait velocity: decreased     General Gait Details: limited by coughing this date   Stairs             Wheelchair Mobility     Tilt Bed Tilt Bed Patient Response: Cooperative  Modified Rankin (Stroke Patients Only)       Balance Overall balance assessment: Needs  assistance Sitting-balance support: Feet supported Sitting balance-Leahy Scale: Good     Standing balance support: During functional activity, No upper extremity supported Standing balance-Leahy Scale: Good                              Communication Communication Communication: No apparent difficulties;Other (comment) Factors Affecting Communication: Non - English speaking, interpreter not available  Cognition Arousal: Alert Behavior During Therapy: WFL for tasks assessed/performed   PT - Cognitive impairments: No apparent impairments                         Following commands: Intact      Cueing Cueing Techniques: Verbal cues  Exercises      General Comments        Pertinent Vitals/Pain Pain Assessment Pain Assessment: No/denies pain    Home Living                          Prior Function            PT Goals (current goals can now be found in the care plan section) Progress towards PT goals: Progressing toward goals    Frequency    Min 3X/week      PT Plan      Co-evaluation  AM-PAC PT 6 Clicks Mobility   Outcome Measure  Help needed turning from your back to your side while in a flat bed without using bedrails?: None Help needed moving from lying on your back to sitting on the side of a flat bed without using bedrails?: None Help needed moving to and from a bed to a chair (including a wheelchair)?: A Little Help needed standing up from a chair using your arms (e.g., wheelchair or bedside chair)?: A Little Help needed to walk in hospital room?: A Little Help needed climbing 3-5 steps with a railing? : A Little 6 Click Score: 20    End of Session Equipment Utilized During Treatment: Oxygen Activity Tolerance: Patient tolerated treatment well Patient left: in bed;with call bell/phone within reach;with bed alarm set (in chair position) Nurse Communication:  (O2) PT Visit Diagnosis: Unsteadiness on  feet (R26.81);Muscle weakness (generalized) (M62.81)     Time: 8956-8945 PT Time Calculation (min) (ACUTE ONLY): 11 min  Charges:    $Therapeutic Activity: 8-22 mins PT General Charges $$ ACUTE PT VISIT: 1 Visit                   Lauraine Gills, PTA 05/23/2024, 11:13 AM

## 2024-05-23 NOTE — Progress Notes (Signed)
 PHARMACIST - PHYSICIAN COMMUNICATION DR:   Lenon CONCERNING: Antibiotic IV to Oral Route Change Policy  RECOMMENDATION: This patient is receiving azithromcyin by the intravenous route.  Based on criteria approved by the Pharmacy and Therapeutics Committee, the antibiotic(s) is/are being converted to the equivalent oral dose form(s).   DESCRIPTION: These criteria include: Patient being treated for a respiratory tract infection, urinary tract infection, cellulitis or clostridium difficile associated diarrhea if on metronidazole The patient is not neutropenic and does not exhibit a GI malabsorption state The patient is eating (either orally or via tube) and/or has been taking other orally administered medications for a least 24 hours The patient is improving clinically and has a Tmax < 100.5  If you have questions about this conversion, please contact the Pharmacy Department  []   [phone] )  Timothy Bass [x]   [phone] )  Timothy Bass []   [phone] )  Timothy Bass []   [phone] )  Timothy Bass []   [phone] )  Timothy Bass      Timothy Bass D 05/23/2024 2:20 PM

## 2024-05-23 NOTE — Progress Notes (Signed)
 " PROGRESS NOTE Timothy Bass    DOB: Oct 24, 1932, 89 y.o.  FMW:[id number]    Code Status: Full Code   DOA: 05/20/2024   LOS: 2  Brief hospital course  Timothy Bass is a 89 y.o. male with a PMH significant for BPH, coronary artery disease, status post three-vessel CABG, status post PCI and stent, GERD, hypertension, dyslipidemia, PVD, BPH, and osteoarthritis. They presented to the ER with acute onset of dyspnea with associated midsternal chest pain worsening with cough.   ED Course: BP 194/97,heart rate of 114, RR 25, temperature 97.5 with pulse oximetry of 93 to 94% on room air and later 89% on room air. Improved to 93 on 2 L of O2 by nasal cannula.  Labs: hyponatremia 134 and hypochloremia of 92 and otherwise unremarkable BMP.  proBNP was only 336 and high-sensitivity troponin T was less than 15 and later 44.  CBC showed hemoconcentration.  Respiratory panel came back negative.  PT and INR were normal. EKG: sinus tachycardia with rate of 114 with wide QRS complexes and T wave inversion anteroseptally. Imaging: Portable chest x-ray showed retrocardiac consolidation that may reflect acute lobar pneumonia or aspiration and multiple small right apical pulmonary nodules likely corresponding to the nodules noted on prior chest CT dated 02/17/2017.   Chest CTA revealed the following: 1. No pulmonary embolism. 2. Bibasilar asymmetric airspace infiltrates, greatest in the left lower lobe, with extensive debris and airway impaction within the lower lobe bronchi bilaterally, most consistent with aspiration , less likely multifocal infection. 3. Moderate emphysema. 4. Stable clustered spiculated right apical nodules measuring up to 5 mm since February 17, 2017. no follow-up imaging recommended.   The patient was given Nitropaste and IV Unasyn and Rocephin .  Cardiology was consulted and ruled out ACS.   05/23/2024 -feels better. Denies dyspnea at rest. Still significant cough.   Assessment & Plan  Principal  Problem:   Aspiration pneumonia (HCC) Active Problems:   Sepsis due to pneumonia [facility])   Coronary artery disease   BPH (benign prostatic hyperplasia)   Dyslipidemia   Essential hypertension  Sepsis due to pneumonia- criteria resolved s/p IVF Aspiration pneumonia (HCC) - Will continue antibiotic therapy with IV Unasyn and Zithromax. Augmentin at dc. Likely tomorrow  - Mucolytic therapy and bronchodilator therapy - PT/OT   Demand mismatch  Coronary artery disease- chest pain resolved. Cardiology was consulted and ruled out ACS.  - continue statin therapy, Toprol-XL and Imdur  as well as Plavix ,.   Essential hypertension -continue antihypertensive therapy.   Dyslipidemia - Continue statin therapy and gemfibrozil .   BPH (benign prostatic hyperplasia) - continue Proscar   Body mass index is 28.18 kg/m.  VTE ppx: enoxaparin (LOVENOX) injection 40 mg Start: 05/21/24 0800  Diet:     Diet   Diet Heart Room service appropriate? Yes; Fluid consistency: Thin   Consultants: Cardiology   Subjective 05/23/2024    Pt reports feeling well. Has no shortness of breath. Denies chest pain.    Objective  Blood pressure (!) 152/65, pulse 91, temperature 98.8 F (37.1 C), temperature source Oral, resp. rate 19, height 5' 6 (1.676 m), weight 78 kg, SpO2 97%.  Intake/Output Summary (Last 24 hours) at 05/23/2024 0747 Last data filed at 05/22/2024 1939 Gross per 24 hour  Intake 200 ml  Output 350 ml  Net -150 ml   Filed Weights   05/20/24 2037 05/20/24 2105 05/23/24 0552  Weight: 78 kg 78 kg 79.2 kg    Physical Exam:  General: awake,  alert, NAD HEENT: atraumatic, clear conjunctiva, anicteric sclera, MMM, hearing grossly normal Respiratory: normal respiratory effort. CTAB. Inspiration induces cough Cardiovascular: extremities well perfused, quick capillary refill, normal S1/S2, RRR, no JVD, murmurs Nervous: A&O x3. no gross focal neurologic deficits, normal speech Extremities: moves all  equally, no edema, normal tone Skin: dry, intact, normal temperature, normal color. No rashes, lesions or ulcers on exposed skin Psychiatry: normal mood, congruent affect  Labs   I have personally reviewed the following labs and imaging studies CBC    Component Value Date/Time   WBC 4.8 05/21/2024 0255   RBC 4.74 05/21/2024 0255   HGB 15.6 05/21/2024 0255   HCT 43.7 05/21/2024 0255   PLT 154 05/21/2024 0255   MCV 92.2 05/21/2024 0255   MCH 32.9 05/21/2024 0255   MCHC 35.7 05/21/2024 0255   RDW 12.1 05/21/2024 0255   LYMPHSABS 1.4 02/17/2017 2045   MONOABS 0.5 02/17/2017 2045   EOSABS 0.1 02/17/2017 2045   BASOSABS 0.0 02/17/2017 2045      Latest Ref Rng & Units 05/23/2024    3:38 AM 05/21/2024    2:55 AM 05/20/2024    8:59 PM  BMP  Glucose 70 - 99 mg/dL 886  866  864   BUN 8 - 23 mg/dL 23  19  16    Creatinine 0.61 - 1.24 mg/dL 9.36  9.31  9.28   Sodium 135 - 145 mmol/L 132  132  134   Potassium 3.5 - 5.1 mmol/L 3.2  3.1  3.7   Chloride 98 - 111 mmol/L 94  94  92   CO2 22 - 32 mmol/L 26  23  27    Calcium  8.9 - 10.3 mg/dL 8.8  9.0  9.1    No results found.  Disposition Plan & Communication  Patient status: Inpatient  Admitted From: Home Planned disposition location: Home Anticipated discharge date: 1/3 pending O2 wean   Family Communication: phonecall with son.     Author: Marien LITTIE Piety, DO [facility] 05/23/2024, 7:47 AM   Available by Epic secure chat 7AM-7PM. If 7PM-7AM, please contact night-coverage.  TRH contact information found on [email or web address].  "

## 2024-05-23 NOTE — TOC Initial Note (Signed)
 Transition of Care [facility]) - Initial/Assessment Note    Patient Details  Name: Timothy Bass MRN: [id number] Date of Birth: 1932/10/06  Transition of Care [facility]) CM/SW Contact:    Shasta DELENA Daring, RN Phone Number: 05/23/2024, 4:38 PM  Clinical Narrative:       [facility] interpreter services # [phone]           RNCM net with patient. Patient alone in room. Provided the following information. Lives in single family home with wife. Son lives in [place] and will provide transportation home at discharge. [facility], unable to determine which one.  Said he is able for afford medications without difficulty. Advised of recommendations for home health.  Patient was agreeable to search.    Search initiated.    Expected Discharge Plan: Home w Home Health Services     Patient Goals and CMS Choice            Expected Discharge Plan and Services                                              Prior Living Arrangements/Services                       Activities of Daily Living   ADL Screening (condition at time of admission) Independently performs ADLs?: Yes (appropriate for developmental age) Is the patient deaf or have difficulty hearing?: Yes Does the patient have difficulty seeing, even when wearing glasses/contacts?: No Does the patient have difficulty concentrating, remembering, or making decisions?: No  Permission Sought/Granted                  Emotional Assessment              Admission diagnosis:  Shortness of breath [R06.02] Aspiration pneumonia (HCC) [J69.0] Hypoxia [R09.02] Patient Active Problem List   Diagnosis Date Noted   Aspiration pneumonia (HCC) 05/21/2024   Sepsis due to pneumonia (HCC) 05/21/2024   Dyslipidemia 05/21/2024   Coronary artery disease 05/21/2024   Essential hypertension 05/21/2024   S/P coronary artery stent placement 09/25/2022   Prediabetes 03/15/2022   CAD (coronary artery disease) 01/05/2016   Unstable angina  (HCC) 12/30/2015   Encounter for screening for malignant neoplasm of colon 04/06/2014   Arteriosclerosis of coronary artery 02/25/2014   Blood glucose elevated 02/25/2014   HLD (hyperlipidemia) 02/25/2014   BP (high blood pressure) 02/25/2014   BPH (benign prostatic hyperplasia) 02/25/2014   Incomplete bladder emptying 02/17/2013   Benign prostatic hyperplasia with urinary obstruction 06/14/2012   Excessive urination at night 06/14/2012   FOM (frequency of micturition) 06/14/2012   PCP:  Glover Lenis, MD Pharmacy:   [facility] [phone] [address], [place] - [address] [place] [address] Phone: [phone] Fax: [phone]     Social Drivers of Health (SDOH) Social History: SDOH Screenings   Food Insecurity: No Food Insecurity (05/22/2024)  Housing: Low Risk (05/22/2024)  Transportation Needs: No Transportation Needs (05/22/2024)  Utilities: Not At Risk (05/22/2024)  Financial Resource Strain: Low Risk  (04/30/2024)   Received from [facility]  Social Connections: Moderately Integrated (05/22/2024)  Tobacco Use: Medium Risk (05/20/2024)   SDOH Interventions:     Readmission Risk Interventions     No data to display

## 2024-05-24 DIAGNOSIS — J188 Other pneumonia, unspecified organism: Secondary | ICD-10-CM | POA: Diagnosis not present

## 2024-05-24 LAB — BASIC METABOLIC PANEL WITH GFR
Anion gap: 12 (ref 5–15)
BUN: 16 mg/dL (ref 8–23)
CO2: 25 mmol/L (ref 22–32)
Calcium: 8.9 mg/dL (ref 8.9–10.3)
Chloride: 94 mmol/L — ABNORMAL LOW (ref 98–111)
Creatinine, Ser: 0.55 mg/dL — ABNORMAL LOW (ref 0.61–1.24)
GFR, Estimated: >60 mL/min
Glucose, Bld: 110 mg/dL — ABNORMAL HIGH (ref 70–99)
Potassium: 3.3 mmol/L — ABNORMAL LOW (ref 3.5–5.1)
Sodium: 131 mmol/L — ABNORMAL LOW (ref 135–145)

## 2024-05-24 MED ORDER — IRBESARTAN 150 MG PO TABS
150.0000 mg | ORAL_TABLET | Freq: Every day | ORAL | Status: DC
  Administered 2024-05-24 – 2024-05-25 (×2): 150 mg via ORAL
  Filled 2024-05-24 (×2): qty 1

## 2024-05-24 MED ORDER — TAMSULOSIN HCL 0.4 MG PO CAPS
0.8000 mg | ORAL_CAPSULE | Freq: Every day | ORAL | Status: DC
  Administered 2024-05-24 – 2024-05-25 (×2): 0.8 mg via ORAL
  Filled 2024-05-24 (×2): qty 2

## 2024-05-24 MED ORDER — GUAIFENESIN ER 600 MG PO TB12: 600.0000 mg | ORAL_TABLET | Freq: Two times a day (BID) | ORAL | Status: DC | PRN

## 2024-05-24 MED ORDER — POLYETHYLENE GLYCOL 3350 17 G PO PACK
17.0000 g | PACK | Freq: Two times a day (BID) | ORAL | Status: DC
  Administered 2024-05-24 – 2024-05-25 (×3): 17 g via ORAL
  Filled 2024-05-24 (×3): qty 1

## 2024-05-24 MED ORDER — POTASSIUM CHLORIDE CRYS ER 20 MEQ PO TBCR
40.0000 meq | EXTENDED_RELEASE_TABLET | Freq: Two times a day (BID) | ORAL | Status: AC
  Administered 2024-05-24 (×2): 40 meq via ORAL
  Filled 2024-05-24 (×2): qty 2

## 2024-05-24 MED ORDER — HYDROCHLOROTHIAZIDE 50 MG PO TABS
50.0000 mg | ORAL_TABLET | Freq: Every day | ORAL | Status: DC
  Administered 2024-05-24 – 2024-05-25 (×2): 50 mg via ORAL
  Filled 2024-05-24 (×3): qty 1
  Filled 2024-05-24: qty 2
  Filled 2024-05-24: qty 1
  Filled 2024-05-24: qty 2

## 2024-05-24 MED ORDER — SENNA 8.6 MG PO TABS
1.0000 | ORAL_TABLET | Freq: Every day | ORAL | Status: DC
  Administered 2024-05-24 – 2024-05-25 (×2): 8.6 mg via ORAL
  Filled 2024-05-24 (×2): qty 1

## 2024-05-24 MED ORDER — TAMSULOSIN HCL 0.4 MG PO CAPS: 0.4000 mg | ORAL_CAPSULE | Freq: Every day | ORAL | Status: DC

## 2024-05-24 NOTE — Progress Notes (Addendum)
 " PROGRESS NOTE Timothy Bass    DOB: Apr 27, 1933, 89 y.o.  FMW:[id number]    Code Status: Full Code   DOA: 05/20/2024   LOS: 3  Brief hospital course  [name] is a 89 y.o. male with a PMH significant for BPH, coronary artery disease, status post three-vessel CABG, status post PCI and stent, GERD, hypertension, dyslipidemia, PVD, BPH, and osteoarthritis. They presented to the ER with acute onset of dyspnea with associated midsternal chest pain worsening with cough.   ED Course: BP 194/97,heart rate of 114, RR 25, temperature 97.5 with pulse oximetry of 93 to 94% on room air and later 89% on room air. Improved to 93 on 2 L of O2 by nasal cannula.  Labs: hyponatremia 134 and hypochloremia of 92 and otherwise unremarkable BMP.  proBNP was only 336 and high-sensitivity troponin T was less than 15 and later 44.  CBC showed hemoconcentration.  Respiratory panel came back negative.  PT and INR were normal. EKG: sinus tachycardia with rate of 114 with wide QRS complexes and T wave inversion anteroseptally. Imaging: Portable chest x-ray showed retrocardiac consolidation that may reflect acute lobar pneumonia or aspiration and multiple small right apical pulmonary nodules likely corresponding to the nodules noted on prior chest CT dated 02/17/2017.   Chest CTA revealed the following: 1. No pulmonary embolism. 2. Bibasilar asymmetric airspace infiltrates, greatest in the left lower lobe, with extensive debris and airway impaction within the lower lobe bronchi bilaterally, most consistent with aspiration , less likely multifocal infection. 3. Moderate emphysema. 4. Stable clustered spiculated right apical nodules measuring up to 5 mm since February 17, 2017. no follow-up imaging recommended.   The patient was given Nitropaste and IV Unasyn  and Rocephin .  Cardiology was consulted and ruled out ACS.   05/24/2024 -feels better. Continues to remain on 2L. Unfortunately experienced some acute urinary retention  overnight.  Assessment & Plan  Principal Problem:   Aspiration pneumonia (HCC) Active Problems:   Sepsis due to pneumonia [facility])   Coronary artery disease   BPH (benign prostatic hyperplasia)   Dyslipidemia   Essential hypertension  Sepsis due to pneumonia- criteria resolved s/p IVF Aspiration pneumonia (HCC) - Will continue antibiotic therapy with IV Unasyn  and Zithromax . Augmentin  at dc. Likely tomorrow  - Mucolytic therapy and bronchodilator therapy - PT/OT  Constipation- may be contributing to urinary retention = - miralax  and senna added   Demand mismatch  Coronary artery disease- chest pain resolved. Cardiology was consulted and ruled out ACS.  - continue statin therapy, Toprol -XL and Imdur  as well as Plavix   Hypokalemia- K+ 3.3 - monitor and replete PRN   Essential hypertension -continue antihypertensive therapy.   Dyslipidemia - Continue statin therapy and gemfibrozil .   BPH- had acute urinary retention overnight. Not complete. On gemteza at home.  - starting flomax  - post-void residual bladder scans today  Body mass index is 28.18 kg/m.  VTE ppx: enoxaparin  (LOVENOX ) injection 40 mg Start: 05/21/24 0800  Diet:     Diet   Diet Heart Room service appropriate? Yes; Fluid consistency: Thin   Consultants: Cardiology   Subjective 05/24/2024    Pt reports feeling well. Has no shortness of breath. Denies chest pain. Denies abdominal pain   Objective  Blood pressure (!) 152/65, pulse 91, temperature 98.8 F (37.1 C), temperature source Oral, resp. rate 19, height 5' 6 (1.676 m), weight 78 kg, SpO2 97%.  Intake/Output Summary (Last 24 hours) at 05/24/2024 0715 Last data filed at 05/23/2024 2115  Gross per 24 hour  Intake 240 ml  Output 456 ml  Net -216 ml   Filed Weights   05/20/24 2037 05/20/24 2105 05/23/24 0552  Weight: 78 kg 78 kg 79.2 kg    Physical Exam:  General: awake, alert, NAD HEENT: atraumatic, clear conjunctiva, anicteric sclera, MMM,  hearing grossly normal Respiratory: normal respiratory effort. CTAB. Cardiovascular: extremities well perfused, quick capillary refill, normal S1/S2, RRR, no JVD, murmurs Nervous: A&O x3. no gross focal neurologic deficits, normal speech Extremities: moves all equally, no edema, normal tone Skin: dry, intact, normal temperature, normal color. No rashes, lesions or ulcers on exposed skin Psychiatry: normal mood, congruent affect  Labs   I have personally reviewed the following labs and imaging studies CBC    Component Value Date/Time   WBC 4.8 05/21/2024 0255   RBC 4.74 05/21/2024 0255   HGB 15.6 05/21/2024 0255   HCT 43.7 05/21/2024 0255   PLT 154 05/21/2024 0255   MCV 92.2 05/21/2024 0255   MCH 32.9 05/21/2024 0255   MCHC 35.7 05/21/2024 0255   RDW 12.1 05/21/2024 0255   LYMPHSABS 1.4 02/17/2017 2045   MONOABS 0.5 02/17/2017 2045   EOSABS 0.1 02/17/2017 2045   BASOSABS 0.0 02/17/2017 2045      Latest Ref Rng & Units 05/24/2024    5:07 AM 05/23/2024    3:38 AM 05/21/2024    2:55 AM  BMP  Glucose 70 - 99 mg/dL 889  886  866   BUN 8 - 23 mg/dL 16  23  19    Creatinine 0.61 - 1.24 mg/dL 9.44  9.36  9.31   Sodium 135 - 145 mmol/L 131  132  132   Potassium 3.5 - 5.1 mmol/L 3.3  3.2  3.1   Chloride 98 - 111 mmol/L 94  94  94   CO2 22 - 32 mmol/L 25  26  23    Calcium  8.9 - 10.3 mg/dL 8.9  8.8  9.0    No results found.  Disposition Plan & Communication  Patient status: Inpatient  Admitted From: Home Planned disposition location: Home Anticipated discharge date: 1/4 pending O2 wean, UOP  Family Communication: phonecall with son.     Author: Marien LITTIE Piety, DO [facility] 05/24/2024, 7:15 AM   Available by Epic secure chat 7AM-7PM. If 7PM-7AM, please contact night-coverage.  TR[facility] contact information found on [email or web address].  "

## 2024-05-24 NOTE — Plan of Care (Signed)
 Pt is alert oriented x 4. 2L/[place]. Pt ambulated with walker. Pt c/o constipation last BM 05/21/24, PRN medication given for constipation. Pt c/o difficulty urinating, bladder scans completed. First bladder scan pt stated he felt he could void, pt voided, 156 mls noted in bladder scan after. Pt later called out stating he could not void again. Pt ambulated in room and attempted to pee standing. Pt unable to do so. Bladder scan completed 456 mls noted in bladder.  Dr Cleatus informed. Received order for in and out cath. Pt voided 200 mls on his own. Bladder scan completed again 383 mls noted. In and out cath completed 400 mls urine removed. Pts blood pressure increased to 180's systolic Dr. Cleatus informed, received order to give morning blood pressure medications. Time for medications changed. Pharmacy messaged to request medications stocked in cental pharmacy. Medications not received. Message sent again. Day shift nurse informed medications not received yet.   Problem: Fluid Volume: Goal: Hemodynamic stability will improve Outcome: Progressing   Problem: Clinical Measurements: Goal: Diagnostic test results will improve Outcome: Progressing Goal: Signs and symptoms of infection will decrease Outcome: Progressing   Problem: Respiratory: Goal: Ability to maintain adequate ventilation will improve Outcome: Progressing   Problem: Education: Goal: Knowledge of General Education information will improve Description: Including pain rating scale, medication(s)/side effects and non-pharmacologic comfort measures Outcome: Progressing   Problem: Health Behavior/Discharge Planning: Goal: Ability to manage health-related needs will improve Outcome: Progressing   Problem: Clinical Measurements: Goal: Ability to maintain clinical measurements within normal limits will improve Outcome: Progressing Goal: Will remain free from infection Outcome: Progressing Goal: Diagnostic test results will  improve Outcome: Progressing Goal: Respiratory complications will improve Outcome: Progressing Goal: Cardiovascular complication will be avoided Outcome: Progressing   Problem: Activity: Goal: Risk for activity intolerance will decrease Outcome: Progressing   Problem: Nutrition: Goal: Adequate nutrition will be maintained Outcome: Progressing   Problem: Elimination: Goal: Will not experience complications related to bowel motility Outcome: Progressing Goal: Will not experience complications related to urinary retention Outcome: Progressing   Problem: Pain Managment: Goal: General experience of comfort will improve and/or be controlled Outcome: Progressing   Problem: Safety: Goal: Ability to remain free from injury will improve Outcome: Progressing   Problem: Skin Integrity: Goal: Risk for impaired skin integrity will decrease Outcome: Progressing

## 2024-05-25 DIAGNOSIS — R0902 Hypoxemia: Secondary | ICD-10-CM | POA: Diagnosis not present

## 2024-05-25 DIAGNOSIS — R338 Other retention of urine: Secondary | ICD-10-CM

## 2024-05-25 DIAGNOSIS — J188 Other pneumonia, unspecified organism: Secondary | ICD-10-CM | POA: Diagnosis not present

## 2024-05-25 DIAGNOSIS — R0602 Shortness of breath: Secondary | ICD-10-CM | POA: Diagnosis not present

## 2024-05-25 MED ORDER — AMOXICILLIN-POT CLAVULANATE 875-125 MG PO TABS: 1.0000 | ORAL_TABLET | Freq: Two times a day (BID) | ORAL | 0 refills | Status: AC

## 2024-05-25 MED ORDER — IPRATROPIUM-ALBUTEROL 0.5-2.5 (3) MG/3ML IN SOLN: 3.0000 mL | Freq: Two times a day (BID) | RESPIRATORY_TRACT | Status: DC

## 2024-05-25 MED ORDER — TAMSULOSIN HCL 0.4 MG PO CAPS: 0.4000 mg | ORAL_CAPSULE | Freq: Every day | ORAL | 0 refills | Status: AC

## 2024-05-25 MED ORDER — GUAIFENESIN ER 600 MG PO TB12: 600.0000 mg | ORAL_TABLET | Freq: Two times a day (BID) | ORAL | 0 refills | Status: AC | PRN

## 2024-05-25 NOTE — Plan of Care (Signed)
  Problem: Activity: Goal: Risk for activity intolerance will decrease Outcome: Progressing   Problem: Nutrition: Goal: Adequate nutrition will be maintained Outcome: Progressing   Problem: Elimination: Goal: Will not experience complications related to urinary retention Outcome: Progressing   Problem: Pain Managment: Goal: General experience of comfort will improve and/or be controlled Outcome: Progressing   Problem: Safety: Goal: Ability to remain free from injury will improve Outcome: Progressing   Problem: Skin Integrity: Goal: Risk for impaired skin integrity will decrease Outcome: Progressing

## 2024-05-25 NOTE — Discharge Instructions (Addendum)
 Please continue your antibiotics to treat your pneumonia until it is completed. First dose will be today at supper time.   Please take your new medication called flomax  to help with urination every morning. I recommend following up with your urologist at earliest appointment to follow up with this.   Make an appointment with your primary doctor in 1-2 weeks who can review your oxygen requirement and wean you from oxygen if you have recovered. They can discontinue the oxygen if no longer needed. Until then, please wear the oxygen all the time, even when eating.   Home health services will be setting up appointments with you to have physical therapy, occupational therapy and a nurse aid come to your home.

## 2024-05-25 NOTE — TOC Transition Note (Signed)
 Transition of Care [facility]) - Discharge Note   Patient Details  Name: Timothy Bass MRN: [id number] Date of Birth: [birth year]  Transition of Care [facility]) CM/SW Contact:  Victory Jackquline RAMAN, RN Phone Number: 05/25/2024, 12:03 PM   Clinical Narrative:    10:31 am: RNCM received a messaged from the MD via secure chat informing me that she was discharging the patient home and he needs oxygen. RNCM sent an email to [employer] [facility] for oxygen to be delivered to patient's bedside.   11:54 am: RNCM spoke to patient's  son Norine Chestnut @ [phone], introduced role and explained that discharge planning would be discussed. Informed him that the patient would be discharged home today with oxygen and that the oxygen was ordered. Reviewed HH agencies with him and picked [facility]. He said that his brother Cecilia will be picking his dad up today. No further concerns. RNCM signing off.      Final next level of care: Home w Home Health Services Barriers to Discharge: Barriers Resolved   Patient Goals and CMS Choice Patient states their goals for this hospitalization and ongoing recovery are:: go home          Discharge Placement                Patient to be transferred to facility by: Son/Yong Name of family member notified: Norine Chestnut Patient and family notified of of transfer: 05/25/24  Discharge Plan and Services Additional resources added to the After Visit Summary for   In-house Referral: Clinical Social Work              DME Arranged: Oxygen DME Agency: [employer] Date DME Agency Contacted: 05/25/24   Representative spoke with at DME Agency: Email sent to [facility] HH Arranged: PT, OT, Nurse's Aide, Respirator Therapy HH Agency: [facility] Date HH Agency Contacted: 05/23/24 (Referral Sent via EPIC)   Representative spoke with at [facility] Agency: Referral sent via EPIC  Social Drivers of Health (SDOH) Interventions SDOH Screenings   Food Insecurity: No Food  Insecurity (05/22/2024)  Housing: Low Risk (05/22/2024)  Transportation Needs: No Transportation Needs (05/22/2024)  Utilities: Not At Risk (05/22/2024)  Financial Resource Strain: Low Risk  (04/30/2024)   Received from [facility]  Social Connections: Moderately Integrated (05/22/2024)  Tobacco Use: Medium Risk (05/20/2024)     Readmission Risk Interventions     No data to display

## 2024-05-25 NOTE — Progress Notes (Signed)
 Completed walk test at this time. Increased work of breathing and oxygen demands noted.   Patient Saturations on Room Air at Rest = 94%  Patient Saturations on [employer] while Ambulating = 85%  Patient Saturations on 3 Liters of oxygen while Ambulating = 92%

## 2024-05-25 NOTE — Discharge Summary (Signed)
 " Physician Discharge Summary  Patient: Timothy Bass FMW:[id number] DOB: 1933/01/02   Code Status: Full Code Admit date: 05/20/2024 Discharge date: 05/25/2024 Disposition: Home health, PT, OT, and nurse aid PCP: Glover Lenis, MD  Recommendations for Outpatient Follow-up:  Follow up with PCP within 1-2 weeks Regarding general hospital follow up and preventative care Recommend weaning from O2 as tolerated Follow up with urology Regarding BPH and acute urinary retention   Discharge Diagnoses: Principal Problem:   Aspiration pneumonia (HCC) Active Problems:   Sepsis due to pneumonia [facility])   Coronary artery disease   BPH (benign prostatic hyperplasia)   Dyslipidemia   Essential hypertension   Shortness of breath   Hypoxia   Multifocal pneumonia   Acute urinary retention  Brief Hospital Course Summary: Timothy Bass is a 89 y.o. male with a PMH significant for BPH, coronary artery disease, status post three-vessel CABG, status post PCI and stent, GERD, hypertension, dyslipidemia, PVD, BPH, and osteoarthritis. They presented to the ER with acute onset of dyspnea with associated midsternal chest pain worsening with cough.   ED Course: BP 194/97,heart rate of 114, RR 25, temperature 97.5 with pulse oximetry of 93 to 94% on room air and later 89% on room air. Improved to 93 on 2 L of O2 by nasal cannula.  Labs: hyponatremia 134 and hypochloremia of 92 and otherwise unremarkable BMP.  proBNP was only 336 and high-sensitivity troponin T was less than 15 and later 44.  CBC showed hemoconcentration.  Respiratory panel came back negative.  PT and INR were normal. EKG: sinus tachycardia with rate of 114 with wide QRS complexes and T wave inversion anteroseptally. Imaging: retrocardiac consolidation that may reflect acute lobar pneumonia or aspiration and multiple small right apical pulmonary nodules likely corresponding to the nodules noted on prior chest CT dated 02/17/2017.   Chest CTA revealed the  following: 1. No pulmonary embolism. 2. Bibasilar asymmetric airspace infiltrates, greatest in the left lower lobe, with extensive debris and airway impaction within the lower lobe bronchi bilaterally, most consistent with aspiration , less likely multifocal infection. 3. Moderate emphysema. 4. Stable clustered spiculated right apical nodules measuring up to 5 mm since February 17, 2017. no follow-up imaging recommended.   The patient was initially treated with Nitropaste, IV Unasyn  and Rocephin .   Cardiology was consulted and ruled out ACS. His chest pain resolved and his troponins trended flat.   He remained hypoxic while undergoing antibiotic treatment for his pneumonia and ultimately was discharged on 2L [place] to wear continuously at home. His unasyn  was transitioned to augmentin  to complete his treatment at home. Will need recheck for discontinuation at follow up. HH PT was ordered as well.   Additionally, patient experienced transient urinary retention which required one time in and out cath. He was started on flomax . He was followed with post-void residual bladder scans which showed he was moderately able to empty his bladder following this. Will need urology follow up. He already follows with them and was previously on gemteza.   All other chronic conditions were treated with home medications.    Discharge Condition: Good, improved Recommended discharge diet: Regular healthy diet  Consultations: Cardiology   Procedures/Studies: None   Allergies as of 05/25/2024       Reactions   Aspirin     Pt.states aspirin  gives pt. high blood pressure   Peanuts [peanut Oil] Itching        Medication List     TAKE these medications  Acidophilus Lactobacillus Caps Take 1 capsule by mouth daily.   amoxicillin -clavulanate 875-125 MG tablet Commonly known as: AUGMENTIN  Take 1 tablet by mouth 2 (two) times daily for 3 days.   atorvastatin  80 MG tablet Commonly known as:  Lipitor  Take 1 tablet (80 mg total) by mouth daily.   Azelastine HCl 137 MCG/SPRAY Soln Place 1 spray into both nostrils 2 (two) times daily.   cetirizine 10 MG tablet Commonly known as: ZYRTEC Take 10 mg by mouth daily as needed for allergies.   clopidogrel  75 MG tablet Commonly known as: PLAVIX  Take 1 tablet (75 mg total) by mouth daily with breakfast.   Gemtesa  75 MG Tabs Generic drug: Vibegron  Take 1 tablet (75 mg total) by mouth daily.   guaiFENesin  600 MG 12 hr tablet Commonly known as: MUCINEX  Take 1 tablet (600 mg total) by mouth 2 (two) times daily as needed for up to 10 days for to loosen phlegm.   Linzess 145 MCG Caps capsule Generic drug: linaclotide Take 145 mcg by mouth daily.   metoprolol  succinate 50 MG 24 hr tablet Commonly known as: TOPROL -XL Take 1 tablet by mouth daily.   montelukast 10 MG tablet Commonly known as: SINGULAIR Take 1 tablet by mouth at bedtime.   polycarbophil 625 MG tablet Commonly known as: FIBERCON Take 625 mg by mouth daily.   polyethylene glycol 17 g packet Commonly known as: MIRALAX  / GLYCOLAX  Take 17 g by mouth daily as needed.   tamsulosin  0.4 MG Caps capsule Commonly known as: FLOMAX  Take 1 capsule (0.4 mg total) by mouth daily after breakfast.   valsartan -hydrochlorothiazide  160-25 MG tablet Commonly known as: DIOVAN -HCT Take 1 tablet by mouth daily.               Durable Medical Equipment  (From admission, onward)           Start     Ordered   05/25/24 1031  For home use only DME oxygen  Once       Question Answer Comment  Length of Need 6 Months   Mode or (Route) Nasal cannula   Liters per Minute 2   Frequency Continuous (stationary and portable oxygen unit needed)   Oxygen conserving device Yes   Oxygen delivery system: Gas   Oxygen delivery system: Portable concentrator (POC)      05/25/24 1031            Follow-up Information     Clarisa Kung, PA-C. Go in 1 week(s).   Contact  information: [address] [facility] [place] [address] [phone]                 Subjective   Pt reports some residual cough. No shortness of breath at rest or with ambulating short distances. Denies urinary retention.   All questions and concerns were addressed at time of discharge.  Objective  Blood pressure (!) 165/90, pulse 100, temperature 98.2 F (36.8 C), temperature source Oral, resp. rate (!) 21, height 5' 6 (1.676 m), weight 79.2 kg, SpO2 96%.   General: Pt is alert, awake, not in acute distress Cardiovascular: RRR, S1/S2 +, no rubs, no gallops Respiratory: CTA bilaterally, no wheezing, no rhonchi Abdominal: Soft, NT, ND, bowel sounds + Extremities: no edema, no cyanosis  The results of significant diagnostics from this hospitalization (including imaging, microbiology, ancillary and laboratory) are listed below for reference.   Imaging studies: CT Angio Chest PE W and/or Wo Contrast Result Date: 05/21/2024 EXAM: CTA CHEST  05/21/2024 01:15:38 AM TECHNIQUE: CTA of the chest was performed after the administration of 75 mL of iohexol  (OMNIPAQUE ) 350 MG/ML injection. Multiplanar reformatted images are provided for review. MIP images are provided for review. Automated exposure control, iterative reconstruction, and/or weight based adjustment of the mA/kV was utilized to reduce the radiation dose to as low as reasonably achievable. COMPARISON: Further compared to 02/17/2017. CLINICAL HISTORY: eval PE. sudden SOB and hypoxia. flash pulm edema clinically possible too FINDINGS: PULMONARY ARTERIES: Pulmonary arteries are adequately opacified for evaluation. No acute pulmonary embolus. Main pulmonary artery is normal in caliber. MEDIASTINUM: Coronary artery bypass grafting has been performed. The pericardium demonstrates no acute abnormality. Extensive atherosclerotic calcification within the thoracic aorta. LYMPH NODES: Shotty mediastinal and left hilar adenopathy  is nonspecific, possible reactive in nature. No axillary lymphadenopathy. LUNGS AND PLEURA: Moderate emphysema. Bibasilar superimposed asymmetric airspace infiltrate, more severe within the left lower lobe. Extensive debris and airway impaction within the lower lobe bronchi bilaterally. Together, the findings suggest changes of aspiration, less likely multifocal infection. Clustered spiculated nodules within the right apex measuring up to 5 mm are stable since prior examination of 02/17/2017. No pneumothorax or pleural effusion. UPPER ABDOMEN: Limited images of the upper abdomen are unremarkable. SOFT TISSUES AND BONES: Osseous structures are age appropriate. No acute bone abnormality. No lytic or blastic bone lesion. No acute soft tissue abnormality. IMPRESSION: 1. No pulmonary embolism. 2. Bibasilar asymmetric airspace infiltrates, greatest in the left lower lobe, with extensive debris and airway impaction within the lower lobe bronchi bilaterally, most consistent with aspiration , less likely multifocal infection. 3. Moderate emphysema. 4. Stable clustered spiculated right apical nodules measuring up to 5 mm since February 17, 2017. no follow-up imaging recommended. Electronically signed by: Dorethia Molt MD 05/21/2024 02:04 AM EST RP Workstation: [id number]   DG Chest Port 1 View Result Date: 05/20/2024 EXAM: 1 VIEW(S) XRAY OF THE CHEST 05/20/2024 08:54:00 PM COMPARISON: None available. CLINICAL HISTORY: Chest pain FINDINGS: LUNGS AND PLEURA: Retrocardiac consolidation, consistent with recent changes of acute lobar pneumonia or aspiration. Multiple small pulmonary nodules are seen within the right apex, likely corresponding to the pulmonary nodule seen within this region on prior chest CT of 02/17/2017. No pleural effusion. No pneumothorax. HEART AND MEDIASTINUM: Coronary artery bypass grafting has been performed. Atherosclerotic plaque present. No acute abnormality of the cardiac and mediastinal  silhouettes. BONES AND SOFT TISSUES: Intact sternotomy wires. No acute osseous abnormality. IMPRESSION: 1. Retrocardiac consolidation, which may reflect acute lobar pneumonia or aspiration. 2. Multiple small right apical pulmonary nodules, likely corresponding to the nodules noted on prior chest CT dated Feb 17, 2017. Electronically signed by: Dorethia Molt MD 05/20/2024 10:38 PM EST RP Workstation: [id number]    Labs: Basic Metabolic Panel: Recent Labs  Lab 05/20/24 2059 05/21/24 0255 05/23/24 0338 05/24/24 0507  NA 134* 132* 132* 131*  K 3.7 3.1* 3.2* 3.3*  CL 92* 94* 94* 94*  CO2 27 23 26 25   GLUCOSE 135* 133* 113* 110*  BUN 16 19 23 16   CREATININE 0.71 0.68 0.63 0.55*  CALCIUM  9.1 9.0 8.8* 8.9  MG  --   --  2.0  --   PHOS  --   --  1.5*  --    CBC: Recent Labs  Lab 05/20/24 2059 05/21/24 0255  WBC 8.6 4.8  HGB 17.1* 15.6  HCT 50.3 43.7  MCV 96.0 92.2  PLT 189 154   Microbiology: Results for orders placed or performed during the hospital encounter of 05/20/24  Resp panel by RT-PCR (RSV, Flu A&B, Covid) Anterior Nasal Swab     Status: None   Collection Time: 05/20/24  8:59 PM   Specimen: Anterior Nasal Swab  Result Value Ref Range Status   SARS Coronavirus 2 by RT PCR NEGATIVE NEGATIVE Final    Comment: (NOTE) SARS-CoV-2 target nucleic acids are NOT DETECTED.  The SARS-CoV-2 RNA is generally detectable in upper respiratory specimens during the acute phase of infection. The lowest concentration of SARS-CoV-2 viral copies this assay can detect is 138 copies/mL. A negative result does not preclude SARS-Cov-2 infection and should not be used as the sole basis for treatment or other patient management decisions. A negative result may occur with  improper specimen collection/handling, submission of specimen other than nasopharyngeal swab, presence of viral mutation(s) within the areas targeted by this assay, and inadequate number of viral copies(<138 copies/mL). A  negative result must be combined with clinical observations, patient history, and epidemiological information. The expected result is Negative.  Fact Sheet for Patients:  bloggercourse.com  Fact Sheet for Healthcare Providers:  seriousbroker.it  This test is no t yet approved or cleared by the [place]  FDA and  has been authorized for detection and/or diagnosis of SARS-CoV-2 by FDA under an Emergency Use Authorization (EUA). This EUA will remain  in effect (meaning this test can be used) for the duration of the COVID-19 declaration under Section 564(b)(1) of the Act, 21 U.S.C.section 360bbb-3(b)(1), unless the authorization is terminated  or revoked sooner.       Influenza A by PCR NEGATIVE NEGATIVE Final   Influenza B by PCR NEGATIVE NEGATIVE Final    Comment: (NOTE) The Xpert Xpress SARS-CoV-2/FLU/RSV plus assay is intended as an aid in the diagnosis of influenza from Nasopharyngeal swab specimens and should not be used as a sole basis for treatment. Nasal washings and aspirates are unacceptable for Xpert Xpress SARS-CoV-2/FLU/RSV testing.  Fact Sheet for Patients: bloggercourse.com  Fact Sheet for Healthcare Providers: seriousbroker.it  This test is not yet approved or cleared by the [place]  FDA and has been authorized for detection and/or diagnosis of SARS-CoV-2 by FDA under an Emergency Use Authorization (EUA). This EUA will remain in effect (meaning this test can be used) for the duration of the COVID-19 declaration under Section 564(b)(1) of the Act, 21 U.S.C. section 360bbb-3(b)(1), unless the authorization is terminated or revoked.     Resp Syncytial Virus by PCR NEGATIVE NEGATIVE Final    Comment: (NOTE) Fact Sheet for Patients: bloggercourse.com  Fact Sheet for Healthcare  Providers: seriousbroker.it  This test is not yet approved or cleared by the [place]  FDA and has been authorized for detection and/or diagnosis of SARS-CoV-2 by FDA under an Emergency Use Authorization (EUA). This EUA will remain in effect (meaning this test can be used) for the duration of the COVID-19 declaration under Section 564(b)(1) of the Act, 21 U.S.C. section 360bbb-3(b)(1), unless the authorization is terminated or revoked.  Performed at [facility], [address] [place], [place] [address]     Time coordinating discharge: Over 30 minutes  Marien LITTIE Piety, MD  [facility] 05/25/2024, 10:47 AM  "

## 2024-05-25 NOTE — Progress Notes (Incomplete)
 " PROGRESS NOTE Timothy Bass    DOB: 1932-06-15, 89 y.o.  Timothy Bass    Code Status: Full Code   DOA: 05/20/2024   LOS: 4  Brief hospital course  Timothy Bass is a 89 y.o. male with a PMH significant for BPH, coronary artery disease, status post three-vessel CABG, status post PCI and stent, GERD, hypertension, dyslipidemia, PVD, BPH, and osteoarthritis. They presented to the ER with acute onset of dyspnea with associated midsternal chest pain worsening with cough.   ED Course: BP 194/97,heart rate of 114, RR 25, temperature 97.5 with pulse oximetry of 93 to 94% on room air and later 89% on room air. Improved to 93 on 2 L of O2 by nasal cannula.  Labs: hyponatremia 134 and hypochloremia of 92 and otherwise unremarkable BMP.  proBNP was only 336 and high-sensitivity troponin T was less than 15 and later 44.  CBC showed hemoconcentration.  Respiratory panel came back negative.  PT and INR were normal. EKG: sinus tachycardia with rate of 114 with wide QRS complexes and T wave inversion anteroseptally. Imaging: Portable chest x-ray showed retrocardiac consolidation that may reflect acute lobar pneumonia or aspiration and multiple small right apical pulmonary nodules likely corresponding to the nodules noted on prior chest CT dated 02/17/2017.   Chest CTA revealed the following: 1. No pulmonary embolism. 2. Bibasilar asymmetric airspace infiltrates, greatest in the left lower lobe, with extensive debris and airway impaction within the lower lobe bronchi bilaterally, most consistent with aspiration , less likely multifocal infection. 3. Moderate emphysema. 4. Stable clustered spiculated right apical nodules measuring up to 5 mm since February 17, 2017. no follow-up imaging recommended.   The patient was given Nitropaste and IV Unasyn  and Rocephin .  Cardiology was consulted and ruled out ACS.   05/25/2024 -feels better. Continues to remain on 2L. Unfortunately experienced some acute urinary retention  overnight.  Assessment & Plan  Principal Problem:   Aspiration pneumonia (HCC) Active Problems:   Sepsis due to pneumonia [facility])   Coronary artery disease   BPH (benign prostatic hyperplasia)   Dyslipidemia   Essential hypertension  Sepsis due to pneumonia- criteria resolved s/p IVF Aspiration pneumonia (HCC) - Will continue antibiotic therapy with IV Unasyn  and Zithromax . Augmentin  at dc. Likely tomorrow  - Mucolytic therapy and bronchodilator therapy - PT/OT  Constipation- may be contributing to urinary retention = - miralax  and senna added   Demand mismatch  Coronary artery disease- chest pain resolved. Cardiology was consulted and ruled out ACS.  - continue statin therapy, Toprol -XL and Imdur  as well as Plavix   Hypokalemia- K+ 3.3 - monitor and replete PRN   Essential hypertension -continue antihypertensive therapy.   Dyslipidemia - Continue statin therapy and gemfibrozil .   BPH- had acute urinary retention overnight. Not complete. On gemteza at home.  - starting flomax  - post-void residual bladder scans today  Body mass index is 28.18 kg/m.  VTE ppx: enoxaparin  (LOVENOX ) injection 40 mg Start: 05/21/24 0800  Diet:     Diet   Diet Heart Room service appropriate? Yes; Fluid consistency: Thin   Consultants: Cardiology   Subjective 05/25/2024    Pt reports feeling well. Has no shortness of breath. Denies chest pain. Denies abdominal pain   Objective  Blood pressure (!) 152/65, pulse 91, temperature 98.8 F (37.1 C), temperature source Oral, resp. rate 19, height 5' 6 (1.676 m), weight 78 kg, SpO2 97%.  Intake/Output Summary (Last 24 hours) at 05/25/2024 0726 Last data filed at 05/25/2024 0700  Gross per 24 hour  Intake 200 ml  Output 1275 ml  Net -1075 ml   Filed Weights   05/20/24 2037 05/20/24 2105 05/23/24 0552  Weight: 78 kg 78 kg 79.2 kg    Physical Exam:  General: awake, alert, NAD HEENT: atraumatic, clear conjunctiva, anicteric sclera, MMM,  hearing grossly normal Respiratory: normal respiratory effort. CTAB. Cardiovascular: extremities well perfused, quick capillary refill, normal S1/S2, RRR, no JVD, murmurs Nervous: A&O x3. no gross focal neurologic deficits, normal speech Extremities: moves all equally, no edema, normal tone Skin: dry, intact, normal temperature, normal color. No rashes, lesions or ulcers on exposed skin Psychiatry: normal mood, congruent affect  Labs   I have personally reviewed the following labs and imaging studies CBC    Component Value Date/Time   WBC 4.8 05/21/2024 0255   RBC 4.74 05/21/2024 0255   HGB 15.6 05/21/2024 0255   HCT 43.7 05/21/2024 0255   PLT 154 05/21/2024 0255   MCV 92.2 05/21/2024 0255   MCH 32.9 05/21/2024 0255   MCHC 35.7 05/21/2024 0255   RDW 12.1 05/21/2024 0255   LYMPHSABS 1.4 02/17/2017 2045   MONOABS 0.5 02/17/2017 2045   EOSABS 0.1 02/17/2017 2045   BASOSABS 0.0 02/17/2017 2045      Latest Ref Rng & Units 05/24/2024    5:07 AM 05/23/2024    3:38 AM 05/21/2024    2:55 AM  BMP  Glucose 70 - 99 mg/dL 889  886  866   BUN 8 - 23 mg/dL 16  23  19    Creatinine 0.61 - 1.24 mg/dL 9.44  9.36  9.31   Sodium 135 - 145 mmol/L 131  132  132   Potassium 3.5 - 5.1 mmol/L 3.3  3.2  3.1   Chloride 98 - 111 mmol/L 94  94  94   CO2 22 - 32 mmol/L 25  26  23    Calcium  8.9 - 10.3 mg/dL 8.9  8.8  9.0    No results found.  Disposition Plan & Communication  Patient status: Inpatient  Admitted From: Home Planned disposition location: Home Anticipated discharge date: 1/4 pending O2 wean, UOP  Family Communication: phonecall with son.     Author: Marien LITTIE Piety, DO [facility] 05/25/2024, 7:26 AM   Available by Epic secure chat 7AM-7PM. If 7PM-7AM, please contact night-coverage.  [facility] contact information found on [email or web address].  "

## 2024-05-27 NOTE — Progress Notes (Unsigned)
 "    05/28/2024 2:22 PM   Timothy Bass 26-Aug-1932 [id number]  Referring provider: Glover Lenis, MD [phone] [place],  [place] [address]  Urological history: 1. BPH with LU TS - HoLEP (2021)  - Gemtesa  cost prohibitive  2. Bladder stone - cystolitholapaxy (2022) for 1.5 cm bladder stone  3. High risk hematuria - former smoker - CTU (2024) benign bilateral cysts - cysto (2024) NED   No chief complaint on file.  HPI: Timothy Bass is a 89 y.o. man who presents today for 6 months follow up.   Previous records reviewed.  I PSS ***  He reports sensation of incomplete bladder emptying,   urinary frequency,   urinary intermittency,   urinary urgency,   a weak urinary stream,   having to strain to void,   nocturia x ***,   leaking before being able to reach the restroom,   leaking with coughing,   leaking without awareness,   and post void dribbling.     He is wearing *** pads//depends  daily.    Patient denies any modifying or aggravating factors.  Patient denies any recent UTI's, gross hematuria, dysuria or suprapubic/flank pain.  Patient denies any fevers, chills, nausea or vomiting.  ***  He has a family history of PCa, colon cancer, ovarian cancer and/or breast cancer with ***.   He does not have a family history of PCa, colon cancer, ovarian cancer, and/or breast cancer .***     PVR***  Serum creatinine (05/2024) 0.55  Hemoglobin A1c (03/2023) 6.2    PMH: Past Medical History:  Diagnosis Date   Acquired trigger finger of both middle fingers    Aortic atherosclerosis    Arthritis    BPH without obstruction/lower urinary tract symptoms    Cholelithiasis    Chronic constipation    Coronary artery disease    a.) 3v CABG (LIMA-LAD, SVG-D1, SVG-dLCx) in 1995. b.) LHC 01/05/2016: EF 55%; 100% pLAD, 100% oD1, 100% dLAD, 30% p-mLCx, 65% dLCx-1, 100% dLCx-2, 95% distal graft lesion. Atretic LIMA-LAD; patent SVG-D1; sig disease SVG-PDA. 2.75 x 12 mm  Resolute Integrity DES x 1 to dSVG-PDA graft.   DOE (dyspnea on exertion)    Dysuria    GERD (gastroesophageal reflux disease)    History of 2019 novel coronavirus disease (COVID-19) 05/28/2020   Hypercholesteremia    Hyperlipidemia    Hypertension    Long term current use of antithrombotics/antiplatelets    a.) Clopidogrel    Myocardial infarction (HCC) 1995   Nephrolithiasis    Nocturia    Prediabetes    Prostatic hypertrophy    PVD (peripheral vascular disease)    RBBB (right bundle branch block)    S/P CABG x 3 1995   a.) 3v CABG (LIMA-LAD, SVG-D1, SVG-dLCx)   Urinary frequency    Urinary incontinence     Surgical History: Past Surgical History:  Procedure Laterality Date   CARDIAC CATHETERIZATION N/A 01/05/2016   Procedure: LEFT HEART CATH AND CORS/GRAFTS ANGIOGRAPHY;  Surgeon: Wolm JINNY Rhyme, MD;  Location: [facility] INVASIVE CV LAB;  Service: Cardiovascular;  Laterality: N/A;   CARDIAC CATHETERIZATION N/A 01/05/2016   Procedure: Coronary Stent Intervention (2.75 x 12 mm Resolute Intregity DES x1 to dSVG-PDA graft);  Surgeon: Marsa Dooms, MD;  Location: [facility] INVASIVE CV LAB;  Service: Cardiovascular;  Laterality: N/A;   CATARACT EXTRACTION W/PHACO Right 10/30/2022   Procedure: CATARACT EXTRACTION PHACO AND INTRAOCULAR LENS PLACEMENT (IOC) RIGHT  10.55  01:05.6;  Surgeon: Enola Feliciano Hugger,  MD;  Location: [facility];  Service: Ophthalmology;  Laterality: Right;   CATARACT EXTRACTION W/PHACO Left 12/13/2022   Procedure: CATARACT EXTRACTION PHACO AND INTRAOCULAR LENS PLACEMENT (IOC) LEFT  5.19  00:46.9;  Surgeon: Enola Feliciano Hugger, MD;  Location: [facility];  Service: Ophthalmology;  Laterality: Left;   COLONOSCOPY WITH PROPOFOL  N/A 07/02/2017   Procedure: COLONOSCOPY WITH PROPOFOL ;  Surgeon: Gaylyn Gladis PENNER, MD;  Location: [facility] ENDOSCOPY;  Service: Endoscopy;  Laterality: N/A;   CORONARY ARTERY BYPASS GRAFT  1995   Procedure: 3v CABG  (LIMA-LAD, SVG-D1, SVG-dLCx)   CYSTOSCOPY WITH LITHOLAPAXY N/A 03/18/2021   Procedure: CYSTOSCOPY WITH LITHOLAPAXY;  Surgeon: Francisca Redell BROCKS, MD;  Location: [facility];  Service: Urology;  Laterality: N/A;   ESOPHAGOGASTRODUODENOSCOPY (EGD) WITH PROPOFOL  N/A 05/27/2015   Procedure: ESOPHAGOGASTRODUODENOSCOPY (EGD) WITH PROPOFOL ;  Surgeon: Deward CINDERELLA Piedmont, MD;  Location: [facility] ENDOSCOPY;  Service: Gastroenterology;  Laterality: N/A;   HOLEP-LASER ENUCLEATION OF THE PROSTATE WITH MORCELLATION N/A 10/17/2019   Procedure: HOLEP-LASER ENUCLEATION OF THE PROSTATE WITH MORCELLATION;  Surgeon: Francisca Redell BROCKS, MD;  Location: [facility] OR[facility];  Service: Urology;  Laterality: N/A;   VASECTOMY      Home Medications:  Allergies as of 05/28/2024       Reactions   Aspirin     Pt.states aspirin  gives pt. high blood pressure   Peanuts [peanut Oil] Itching        Medication List        Accurate as of May 27, 2024  2:22 PM. If you have any questions, ask your nurse or doctor.          Acidophilus Lactobacillus Caps Take 1 capsule by mouth daily.   amoxicillin -clavulanate 875-125 MG tablet Commonly known as: AUGMENTIN  Take 1 tablet by mouth 2 (two) times daily for 3 days.   atorvastatin  80 MG tablet Commonly known as: Lipitor  Take 1 tablet (80 mg total) by mouth daily.   Azelastine HCl 137 MCG/SPRAY Soln Place 1 spray into both nostrils 2 (two) times daily.   cetirizine 10 MG tablet Commonly known as: ZYRTEC Take 10 mg by mouth daily as needed for allergies.   clopidogrel  75 MG tablet Commonly known as: PLAVIX  Take 1 tablet (75 mg total) by mouth daily with breakfast.   Gemtesa  75 MG Tabs Generic drug: Vibegron  Take 1 tablet (75 mg total) by mouth daily.   guaiFENesin  600 MG 12 hr tablet Commonly known as: MUCINEX  Take 1 tablet (600 mg total) by mouth 2 (two) times daily as needed for up to 10 days for to loosen phlegm.   Linzess 145 MCG Caps capsule Generic drug: linaclotide Take 145  mcg by mouth daily.   metoprolol  succinate 50 MG 24 hr tablet Commonly known as: TOPROL -XL Take 1 tablet by mouth daily.   montelukast 10 MG tablet Commonly known as: SINGULAIR Take 1 tablet by mouth at bedtime.   polycarbophil 625 MG tablet Commonly known as: FIBERCON Take 625 mg by mouth daily.   polyethylene glycol 17 g packet Commonly known as: MIRALAX  / GLYCOLAX  Take 17 g by mouth daily as needed.   tamsulosin  0.4 MG Caps capsule Commonly known as: FLOMAX  Take 1 capsule (0.4 mg total) by mouth daily after breakfast.   valsartan -hydrochlorothiazide  160-25 MG tablet Commonly known as: DIOVAN -HCT Take 1 tablet by mouth daily.        Allergies: Allergies[1]  Family History: Family History  Family history unknown: Yes    Social History:  reports that he quit smoking about  31 years ago. His smoking use included cigarettes. He started smoking about 51 years ago. He has a 30 pack-year smoking history. He has been exposed to tobacco smoke. He has never used smokeless tobacco. He reports that he does not drink alcohol and does not use drugs.  ROS: Pertinent ROS in HPI  Physical Exam: There were no vitals taken for this visit.  Constitutional:  Well nourished. Alert and oriented, No acute distress. HEENT:  AT, moist mucus membranes.  Trachea midline, no masses. Cardiovascular: No clubbing, cyanosis, or edema. Respiratory: Normal respiratory effort, no increased work of breathing. GI: Abdomen is soft, non tender, non distended, no abdominal masses. Liver and spleen not palpable.  No hernias appreciated.  Stool sample for occult testing is not indicated.   GU: No CVA tenderness.  No bladder fullness or masses.  Patient with circumcised/uncircumcised phallus. ***Foreskin easily retracted***  Urethral meatus is patent.  No penile discharge. No penile lesions or rashes. Scrotum without lesions, cysts, rashes and/or edema.  Testicles are located scrotally bilaterally. No  masses are appreciated in the testicles. Left and right epididymis are normal. Rectal: Patient with  normal sphincter tone. Anus and perineum without scarring or rashes. No rectal masses are appreciated. Prostate is approximately *** grams, *** nodules are appreciated. Seminal vesicles are normal. Skin: No rashes, bruises or suspicious lesions. Lymph: No cervical or inguinal adenopathy. Neurologic: Grossly intact, no focal deficits, moving all 4 extremities. Psychiatric: Normal mood and affect.  Laboratory Data: See Epic and HPI   I have reviewed the labs.   Pertinent Imaging: ***  Assessment & Plan:  ***  1. BPH with LU TS - mild, moderate severe symptoms *** and he is *** - no signs of retention, infection or malignancy *** - PSA up to date *** - DRE benign *** - UA benign *** - PVR < 300 cc *** - most bothersome symptoms are *** - encouraged avoiding bladder irritants, fluid restriction before bedtime and timed voiding's - Initiate alpha-blocker (***), discussed side effects *** - Initiate 5 alpha reductase inhibitor (***), discussed side effects *** - Continue tamsulosin  0.4 mg daily, alfuzosin 10 mg daily, Rapaflo 8 mg daily, terazosin , doxazosin, Cialis 5 mg daily and finasteride  5 mg daily, dutasteride 0.5 mg daily***:refills given - Cannot tolerate medication or medication failure, schedule cystoscopy *** - educated on red flag symptoms: acute retention, gross hematuria, fever, severe pain - advised to call clinic or go to the ED if these occur - return to clinic in *** symptom re-evaluation ***    No follow-ups on file.  These notes generated with voice recognition software. I apologize for typographical errors.  Timothy Bass  [facility] [address]  [address] [place], [place] [address] [phone]     [1]  Allergies Allergen Reactions   Aspirin      Pt.states aspirin  gives pt. high blood pressure   Peanuts [Peanut  Oil] Itching   "

## 2024-05-28 ENCOUNTER — Encounter: Payer: Self-pay | Admitting: Urology

## 2024-05-28 ENCOUNTER — Ambulatory Visit (INDEPENDENT_AMBULATORY_CARE_PROVIDER_SITE_OTHER): Admitting: Urology

## 2024-05-28 VITALS — BP 124/75 | HR 82 | Wt 174.0 lb

## 2024-05-28 DIAGNOSIS — N401 Enlarged prostate with lower urinary tract symptoms: Secondary | ICD-10-CM | POA: Diagnosis not present

## 2024-05-28 DIAGNOSIS — N138 Other obstructive and reflux uropathy: Secondary | ICD-10-CM | POA: Diagnosis not present

## 2024-05-28 LAB — BLADDER SCAN AMB NON-IMAGING

## 2024-05-28 MED ORDER — MIRABEGRON ER 50 MG PO TB24: 50.0000 mg | ORAL_TABLET | Freq: Every day | ORAL | 11 refills | Status: AC

## 2024-06-16 ENCOUNTER — Ambulatory Visit: Admitting: Urology

## 2024-06-17 ENCOUNTER — Other Ambulatory Visit: Payer: Self-pay | Admitting: Student in an Organized Health Care Education/Training Program

## 2024-06-27 NOTE — Progress Notes (Unsigned)
 "    06/30/2024 10:28 AM   Timothy Bass 1932/12/16 [id number]  Referring provider: Glover Lenis, MD [phone] [place],  [place] [address]  Urological history: 1. BPH with LU TS - HoLEP (2021)  - Gemtesa  cost prohibitive  2. Bladder stone - cystolitholapaxy (2022) for 1.5 cm bladder stone  3. High risk hematuria - former smoker - CTU (2024) benign bilateral cysts - cysto (2024) NED   No chief complaint on file.  HPI: Timothy Bass is a 89 y.o. man who presents today for  an over due follow up for BP check with interpreter, ***.    Previous records reviewed.  I PSS ***  - Irritative symptoms: frequency, urgency, nocturia  ___*** - Obstructive symptoms: weak stream, hesitancy, intermittency, straining, incomplete emptying *** - Incontinence: none/post-void dribbling/urge incontinence/stress incontinence/mixed incontinence, and # of pads *** - Symptom progression: stable / worsening *** - Current therapy: ?-blocker? 5-ARI? (document) *** - Response to therapy: partial / good / inadequate *** - Denies: gross hematuria, dysuria, flank pain, fever, chills, retention. *** - Quality of life: moderate bother from urinary symptoms ***  PVR ***  Serum creatinine (05/2024) 0.55  Hemoglobin A1c (03/2023) 6.2  PMH: Past Medical History:  Diagnosis Date   Acquired trigger finger of both middle fingers    Aortic atherosclerosis    Arthritis    BPH without obstruction/lower urinary tract symptoms    Cholelithiasis    Chronic constipation    Coronary artery disease    a.) 3v CABG (LIMA-LAD, SVG-D1, SVG-dLCx) in 1995. b.) LHC 01/05/2016: EF 55%; 100% pLAD, 100% oD1, 100% dLAD, 30% p-mLCx, 65% dLCx-1, 100% dLCx-2, 95% distal graft lesion. Atretic LIMA-LAD; patent SVG-D1; sig disease SVG-PDA. 2.75 x 12 mm Resolute Integrity DES x 1 to dSVG-PDA graft.   DOE (dyspnea on exertion)    Dysuria    GERD (gastroesophageal reflux disease)    History of 2019 novel coronavirus disease  (COVID-19) 05/28/2020   Hypercholesteremia    Hyperlipidemia    Hypertension    Long term current use of antithrombotics/antiplatelets    a.) Clopidogrel    Myocardial infarction (HCC) 1995   Nephrolithiasis    Nocturia    Prediabetes    Prostatic hypertrophy    PVD (peripheral vascular disease)    RBBB (right bundle branch block)    S/P CABG x 3 1995   a.) 3v CABG (LIMA-LAD, SVG-D1, SVG-dLCx)   Urinary frequency    Urinary incontinence     Surgical History: Past Surgical History:  Procedure Laterality Date   CARDIAC CATHETERIZATION N/A 01/05/2016   Procedure: LEFT HEART CATH AND CORS/GRAFTS ANGIOGRAPHY;  Surgeon: Wolm JINNY Rhyme, MD;  Location: [facility] INVASIVE CV LAB;  Service: Cardiovascular;  Laterality: N/A;   CARDIAC CATHETERIZATION N/A 01/05/2016   Procedure: Coronary Stent Intervention (2.75 x 12 mm Resolute Intregity DES x1 to dSVG-PDA graft);  Surgeon: Marsa Dooms, MD;  Location: [facility] INVASIVE CV LAB;  Service: Cardiovascular;  Laterality: N/A;   CATARACT EXTRACTION W/PHACO Right 10/30/2022   Procedure: CATARACT EXTRACTION PHACO AND INTRAOCULAR LENS PLACEMENT (IOC) RIGHT  10.55  01:05.6;  Surgeon: Enola Feliciano Hugger, MD;  Location: [facility];  Service: Ophthalmology;  Laterality: Right;   CATARACT EXTRACTION W/PHACO Left 12/13/2022   Procedure: CATARACT EXTRACTION PHACO AND INTRAOCULAR LENS PLACEMENT (IOC) LEFT  5.19  00:46.9;  Surgeon: Enola Feliciano Hugger, MD;  Location: [facility];  Service: Ophthalmology;  Laterality: Left;   COLONOSCOPY WITH PROPOFOL  N/A 07/02/2017   Procedure:  COLONOSCOPY WITH PROPOFOL ;  Surgeon: Gaylyn Gladis PENNER, MD;  Location: [facility] ENDOSCOPY;  Service: Endoscopy;  Laterality: N/A;   CORONARY ARTERY BYPASS GRAFT  1995   Procedure: 3v CABG (LIMA-LAD, SVG-D1, SVG-dLCx)   CYSTOSCOPY WITH LITHOLAPAXY N/A 03/18/2021   Procedure: CYSTOSCOPY WITH LITHOLAPAXY;  Surgeon: Francisca Redell BROCKS, MD;  Location: [facility] OR[facility];  Service:  Urology;  Laterality: N/A;   ESOPHAGOGASTRODUODENOSCOPY (EGD) WITH PROPOFOL  N/A 05/27/2015   Procedure: ESOPHAGOGASTRODUODENOSCOPY (EGD) WITH PROPOFOL ;  Surgeon: Deward CINDERELLA Piedmont, MD;  Location: [facility] ENDOSCOPY;  Service: Gastroenterology;  Laterality: N/A;   HOLEP-LASER ENUCLEATION OF THE PROSTATE WITH MORCELLATION N/A 10/17/2019   Procedure: HOLEP-LASER ENUCLEATION OF THE PROSTATE WITH MORCELLATION;  Surgeon: Francisca Redell BROCKS, MD;  Location: [facility];  Service: Urology;  Laterality: N/A;   VASECTOMY      Home Medications:  Allergies as of 06/30/2024       Reactions   Aspirin     Pt.states aspirin  gives pt. high blood pressure   Peanuts [peanut Oil] Itching        Medication List        Accurate as of June 27, 2024 10:28 AM. If you have any questions, ask your nurse or doctor.          Acidophilus Lactobacillus Caps Take 1 capsule by mouth daily.   atorvastatin  80 MG tablet Commonly known as: Lipitor  Take 1 tablet (80 mg total) by mouth daily.   Azelastine HCl 137 MCG/SPRAY Soln Place 1 spray into both nostrils 2 (two) times daily.   cetirizine 10 MG tablet Commonly known as: ZYRTEC Take 10 mg by mouth daily as needed for allergies.   clopidogrel  75 MG tablet Commonly known as: PLAVIX  Take 1 tablet (75 mg total) by mouth daily with breakfast.   Linzess 145 MCG Caps capsule Generic drug: linaclotide Take 145 mcg by mouth daily.   metoprolol  succinate 50 MG 24 hr tablet Commonly known as: TOPROL -XL Take 1 tablet by mouth daily.   mirabegron  ER 50 MG Tb24 tablet Commonly known as: MYRBETRIQ  Take 1 tablet (50 mg total) by mouth daily.   montelukast 10 MG tablet Commonly known as: SINGULAIR Take 1 tablet by mouth at bedtime.   polycarbophil 625 MG tablet Commonly known as: FIBERCON Take 625 mg by mouth daily.   polyethylene glycol 17 g packet Commonly known as: MIRALAX  / GLYCOLAX  Take 17 g by mouth daily as needed.   tamsulosin  0.4 MG Caps capsule Commonly  known as: FLOMAX  Take 1 capsule (0.4 mg total) by mouth daily after breakfast.   valsartan -hydrochlorothiazide  160-25 MG tablet Commonly known as: DIOVAN -HCT Take 1 tablet by mouth daily.        Allergies: Allergies[1]  Family History: Family History  Family history unknown: Yes    Social History:  reports that he quit smoking about 31 years ago. His smoking use included cigarettes. He started smoking about 51 years ago. He has a 30 pack-year smoking history. He has been exposed to tobacco smoke. He has never used smokeless tobacco. He reports that he does not drink alcohol and does not use drugs.  ROS: Pertinent ROS in HPI  Physical Exam: There were no vitals taken for this visit.  Constitutional:  Well nourished. Alert and oriented, No acute distress. HEENT:  AT, moist mucus membranes.  Trachea midline, no masses. Cardiovascular: No clubbing, cyanosis, or edema. Respiratory: Normal respiratory effort, no increased work of breathing. GI: Abdomen is soft, non tender, non distended, no abdominal masses. Liver and spleen not  palpable.  No hernias appreciated.  Stool sample for occult testing is not indicated.   GU: No CVA tenderness.  No bladder fullness or masses.  Patient with circumcised/uncircumcised phallus. ***Foreskin easily retracted***  Urethral meatus is patent.  No penile discharge. No penile lesions or rashes. Scrotum without lesions, cysts, rashes and/or edema.  Testicles are located scrotally bilaterally. No masses are appreciated in the testicles. Left and right epididymis are normal. Rectal: Patient with  normal sphincter tone. Anus and perineum without scarring or rashes. No rectal masses are appreciated. Prostate is approximately *** grams, *** nodules are appreciated. Seminal vesicles are normal. Skin: No rashes, bruises or suspicious lesions. Lymph: No cervical or inguinal adenopathy. Neurologic: Grossly intact, no focal deficits, moving all 4  extremities. Psychiatric: Normal mood and affect.   Laboratory Data: See Epic and HPI   I have reviewed the labs.   Pertinent Imaging:  05/28/24 13:32  Scan Result 22mL    Assessment & Plan:    1. BPH with LU TS  - moderate symptoms; no red flags; PVR acceptable. *** - continue / adjust / initiate tadalafil 5 mg daily *** - Continue / adjust / initiate ?-blocker for symptom relief. *** - Consider 5-ARI if prostate >40 g or PSA >1.5.*** - Discussed combination therapy if symptoms remain moderate-severe.*** - Behavioral strategies: reduce evening fluids, avoid bladder irritants, timed/double voiding.*** - Cannot tolerate medication or medication failure, schedule cystoscopy *** - educated on red flag symptoms: acute retention, gross hematuria, fever, severe pain - advised to call clinic or go to the ED if these occur - return to clinic in *** symptom re-evaluation ***    No follow-ups on file.  These notes generated with voice recognition software. I apologize for typographical errors.  Timothy Bass  [facility] [address]  [address] [place], [place] [address] [phone]     [1]  Allergies Allergen Reactions   Aspirin      Pt.states aspirin  gives pt. high blood pressure   Peanuts [Peanut Oil] Itching   "

## 2024-06-30 ENCOUNTER — Ambulatory Visit: Admitting: Urology

## 2024-06-30 DIAGNOSIS — N401 Enlarged prostate with lower urinary tract symptoms: Secondary | ICD-10-CM
